# Patient Record
Sex: Female | Born: 1935 | Race: White | Hispanic: No | Smoking: Former smoker
Health system: Southern US, Community
[De-identification: ages and names within clinical notes are randomized; demographics above are authoritative.]

## PROBLEM LIST (undated history)

## (undated) DIAGNOSIS — K222 Esophageal obstruction: Secondary | ICD-10-CM

## (undated) DIAGNOSIS — S42309A Unspecified fracture of shaft of humerus, unspecified arm, initial encounter for closed fracture: Secondary | ICD-10-CM

## (undated) DIAGNOSIS — E785 Hyperlipidemia, unspecified: Secondary | ICD-10-CM

## (undated) DIAGNOSIS — E669 Obesity, unspecified: Secondary | ICD-10-CM

## (undated) DIAGNOSIS — K449 Diaphragmatic hernia without obstruction or gangrene: Secondary | ICD-10-CM

## (undated) DIAGNOSIS — E039 Hypothyroidism, unspecified: Secondary | ICD-10-CM

## (undated) DIAGNOSIS — N83299 Other ovarian cyst, unspecified side: Secondary | ICD-10-CM

## (undated) DIAGNOSIS — K219 Gastro-esophageal reflux disease without esophagitis: Secondary | ICD-10-CM

## (undated) DIAGNOSIS — M8580 Other specified disorders of bone density and structure, unspecified site: Secondary | ICD-10-CM

## (undated) DIAGNOSIS — F32A Depression, unspecified: Secondary | ICD-10-CM

## (undated) DIAGNOSIS — F329 Major depressive disorder, single episode, unspecified: Secondary | ICD-10-CM

## (undated) DIAGNOSIS — I10 Essential (primary) hypertension: Secondary | ICD-10-CM

## (undated) DIAGNOSIS — K5790 Diverticulosis of intestine, part unspecified, without perforation or abscess without bleeding: Secondary | ICD-10-CM

## (undated) DIAGNOSIS — K649 Unspecified hemorrhoids: Secondary | ICD-10-CM

## (undated) DIAGNOSIS — I639 Cerebral infarction, unspecified: Secondary | ICD-10-CM

## (undated) HISTORY — DX: Other specified disorders of bone density and structure, unspecified site: M85.80

## (undated) HISTORY — PX: TUBAL LIGATION: SHX77

## (undated) HISTORY — DX: Other ovarian cyst, unspecified side: N83.299

## (undated) HISTORY — DX: Major depressive disorder, single episode, unspecified: F32.9

## (undated) HISTORY — DX: Diaphragmatic hernia without obstruction or gangrene: K44.9

## (undated) HISTORY — DX: Cerebral infarction, unspecified: I63.9

## (undated) HISTORY — DX: Unspecified hemorrhoids: K64.9

## (undated) HISTORY — DX: Essential (primary) hypertension: I10

## (undated) HISTORY — DX: Obesity, unspecified: E66.9

## (undated) HISTORY — DX: Gastro-esophageal reflux disease without esophagitis: K21.9

## (undated) HISTORY — DX: Unspecified fracture of shaft of humerus, unspecified arm, initial encounter for closed fracture: S42.309A

## (undated) HISTORY — DX: Depression, unspecified: F32.A

## (undated) HISTORY — DX: Esophageal obstruction: K22.2

## (undated) HISTORY — DX: Hypothyroidism, unspecified: E03.9

## (undated) HISTORY — DX: Diverticulosis of intestine, part unspecified, without perforation or abscess without bleeding: K57.90

## (undated) HISTORY — DX: Hyperlipidemia, unspecified: E78.5

## (undated) HISTORY — PX: CHOLECYSTECTOMY: SHX55

---

## 1988-04-05 HISTORY — PX: MOHS SURGERY: SUR867

## 1999-06-12 ENCOUNTER — Encounter: Admission: RE | Admit: 1999-06-12 | Discharge: 1999-06-12 | Payer: Self-pay | Admitting: Family Medicine

## 1999-06-12 ENCOUNTER — Encounter: Payer: Self-pay | Admitting: Family Medicine

## 1999-11-25 ENCOUNTER — Other Ambulatory Visit: Admission: RE | Admit: 1999-11-25 | Discharge: 1999-11-25 | Payer: Self-pay | Admitting: Family Medicine

## 2000-02-03 ENCOUNTER — Ambulatory Visit (HOSPITAL_COMMUNITY): Admission: RE | Admit: 2000-02-03 | Discharge: 2000-02-03 | Payer: Self-pay | Admitting: Gastroenterology

## 2000-09-13 ENCOUNTER — Encounter: Payer: Self-pay | Admitting: Family Medicine

## 2000-09-13 ENCOUNTER — Encounter: Admission: RE | Admit: 2000-09-13 | Discharge: 2000-09-13 | Payer: Self-pay | Admitting: Family Medicine

## 2001-07-19 ENCOUNTER — Encounter: Payer: Self-pay | Admitting: Family Medicine

## 2001-07-19 ENCOUNTER — Encounter: Admission: RE | Admit: 2001-07-19 | Discharge: 2001-07-19 | Payer: Self-pay | Admitting: Family Medicine

## 2001-11-27 ENCOUNTER — Encounter: Admission: RE | Admit: 2001-11-27 | Discharge: 2001-11-27 | Payer: Self-pay | Admitting: Family Medicine

## 2001-11-27 ENCOUNTER — Encounter: Payer: Self-pay | Admitting: Family Medicine

## 2001-12-06 ENCOUNTER — Other Ambulatory Visit: Admission: RE | Admit: 2001-12-06 | Discharge: 2001-12-06 | Payer: Self-pay | Admitting: Family Medicine

## 2003-02-08 ENCOUNTER — Encounter: Admission: RE | Admit: 2003-02-08 | Discharge: 2003-02-08 | Payer: Self-pay | Admitting: Family Medicine

## 2003-12-18 ENCOUNTER — Other Ambulatory Visit: Admission: RE | Admit: 2003-12-18 | Discharge: 2003-12-18 | Payer: Self-pay | Admitting: Family Medicine

## 2004-03-05 ENCOUNTER — Ambulatory Visit (HOSPITAL_COMMUNITY): Admission: RE | Admit: 2004-03-05 | Discharge: 2004-03-05 | Payer: Self-pay | Admitting: Family Medicine

## 2004-03-19 ENCOUNTER — Encounter: Admission: RE | Admit: 2004-03-19 | Discharge: 2004-03-19 | Payer: Self-pay | Admitting: Family Medicine

## 2004-04-28 ENCOUNTER — Encounter: Admission: RE | Admit: 2004-04-28 | Discharge: 2004-04-28 | Payer: Self-pay | Admitting: Family Medicine

## 2004-09-02 ENCOUNTER — Encounter: Admission: RE | Admit: 2004-09-02 | Discharge: 2004-09-02 | Payer: Self-pay | Admitting: Family Medicine

## 2005-03-19 ENCOUNTER — Encounter: Admission: RE | Admit: 2005-03-19 | Discharge: 2005-03-19 | Payer: Self-pay | Admitting: *Deleted

## 2005-12-28 ENCOUNTER — Encounter: Admission: RE | Admit: 2005-12-28 | Discharge: 2005-12-28 | Payer: Self-pay | Admitting: Family Medicine

## 2006-03-21 ENCOUNTER — Encounter: Admission: RE | Admit: 2006-03-21 | Discharge: 2006-03-21 | Payer: Self-pay | Admitting: Family Medicine

## 2006-09-13 ENCOUNTER — Encounter: Admission: RE | Admit: 2006-09-13 | Discharge: 2006-09-13 | Payer: Self-pay | Admitting: Endocrinology

## 2007-01-06 ENCOUNTER — Encounter: Admission: RE | Admit: 2007-01-06 | Discharge: 2007-01-06 | Payer: Self-pay | Admitting: Family Medicine

## 2007-03-23 ENCOUNTER — Encounter: Admission: RE | Admit: 2007-03-23 | Discharge: 2007-03-23 | Payer: Self-pay | Admitting: Family Medicine

## 2007-10-11 ENCOUNTER — Encounter: Admission: RE | Admit: 2007-10-11 | Discharge: 2007-10-11 | Payer: Self-pay | Admitting: Family Medicine

## 2008-03-13 ENCOUNTER — Encounter: Admission: RE | Admit: 2008-03-13 | Discharge: 2008-03-13 | Payer: Self-pay | Admitting: Family Medicine

## 2008-04-05 DIAGNOSIS — M8580 Other specified disorders of bone density and structure, unspecified site: Secondary | ICD-10-CM

## 2008-04-05 HISTORY — DX: Other specified disorders of bone density and structure, unspecified site: M85.80

## 2009-03-17 ENCOUNTER — Encounter: Admission: RE | Admit: 2009-03-17 | Discharge: 2009-03-17 | Payer: Self-pay | Admitting: Obstetrics & Gynecology

## 2009-04-05 DIAGNOSIS — N83299 Other ovarian cyst, unspecified side: Secondary | ICD-10-CM

## 2009-04-05 HISTORY — DX: Other ovarian cyst, unspecified side: N83.299

## 2009-08-13 ENCOUNTER — Encounter: Admission: RE | Admit: 2009-08-13 | Discharge: 2009-08-13 | Payer: Self-pay | Admitting: Internal Medicine

## 2010-03-18 ENCOUNTER — Encounter
Admission: RE | Admit: 2010-03-18 | Discharge: 2010-03-18 | Payer: Self-pay | Source: Home / Self Care | Attending: Internal Medicine | Admitting: Internal Medicine

## 2010-04-25 ENCOUNTER — Encounter: Payer: Self-pay | Admitting: Family Medicine

## 2010-08-21 NOTE — Procedures (Signed)
[place]  Patient:    Tiffany Rice, Tiffany Rice                   MRN: [id number] Proc. Date: 02/03/00 Adm. Date:  [id number] Attending:  Daine Gip CC:         Talmadge Coventry, M.D.   Procedure Report  PROCEDURE PERFORMED:  Colonoscopy.  ENDOSCOPIST:  Genene Churn. Sherin Quarry, M.D.  INDICATIONS FOR PROCEDURE:  Ms. Imhoff is a 77 year old white female with occult blood in her stool as well as hematochezia who has also had a change in bowel habits including loose stools some of which are urgent.  She has never had a previous evaluation of her colon and it was felt that a baseline study should be obtained on the basis of these problems.  She is not allergic to any medication.  Her other diagnoses are depression and hypothyroidism.  Her current medicines are amitriptyline and Levoxyl.  Prior to the procedure, we discussed the risks and benefits of the procedure including the alternatives and she signed a consent.  Her physical exam prior to the procedure was normal.  PREMEDICATIONS:  Demerol 75 mg, 7.5 mg Versed.  DESCRIPTION OF PROCEDURE:  The Olympus colonoscope was placed in the rectum and advanced to the cecum which was identified by the ileocecal valve as well as the appendiceal orifice.  On removal of the scope, the entire colon was well visualized.  Bleeding internal hemorrhoids were seen.  There was also diverticulosis in the descending and sigmoid colon with some tortuosity of the lumen but no evidence of bleeding or diverticulitis.  No polyps, tumors or neoplastic lesions were seen.  FINAL IMPRESSION: 1. Rectal bleeding from hemorrhoids. 2. Diverticulosis. 3. No polyps.  RECOMMENDATIONS:  The patient should have yearly stools for occult blood, a repeat flexible sigmoidoscopy in five years and possibly a repeat colonoscopy in 10 years or sooner if blood is seen in the stool or if a polyp is seen on flexible  sigmoidoscopy. DD:  02/03/00 TD:  02/03/00 Job: [id number]

## 2011-03-03 ENCOUNTER — Other Ambulatory Visit: Payer: Self-pay | Admitting: Internal Medicine

## 2011-03-03 DIAGNOSIS — Z1231 Encounter for screening mammogram for malignant neoplasm of breast: Secondary | ICD-10-CM

## 2011-04-02 ENCOUNTER — Ambulatory Visit
Admission: RE | Admit: 2011-04-02 | Discharge: 2011-04-02 | Disposition: A | Discharge status: Home / Self Care | Payer: Medicare Other | Source: Ambulatory Visit | Attending: Internal Medicine | Admitting: Internal Medicine

## 2011-04-02 DIAGNOSIS — Z1231 Encounter for screening mammogram for malignant neoplasm of breast: Secondary | ICD-10-CM

## 2012-03-07 ENCOUNTER — Other Ambulatory Visit: Payer: Self-pay | Admitting: Internal Medicine

## 2012-03-07 DIAGNOSIS — R1310 Dysphagia, unspecified: Secondary | ICD-10-CM

## 2012-03-14 ENCOUNTER — Ambulatory Visit
Admission: RE | Admit: 2012-03-14 | Discharge: 2012-03-14 | Disposition: A | Discharge status: Home / Self Care | Payer: Medicare Other | Source: Ambulatory Visit | Attending: Internal Medicine | Admitting: Internal Medicine

## 2012-03-14 DIAGNOSIS — R1310 Dysphagia, unspecified: Secondary | ICD-10-CM

## 2012-03-17 ENCOUNTER — Encounter: Payer: Self-pay | Admitting: Gastroenterology

## 2012-04-13 ENCOUNTER — Ambulatory Visit: Payer: Medicare Other | Admitting: Gastroenterology

## 2012-04-13 ENCOUNTER — Other Ambulatory Visit: Payer: Self-pay

## 2012-04-17 ENCOUNTER — Encounter: Payer: Self-pay | Admitting: Gastroenterology

## 2012-04-17 ENCOUNTER — Ambulatory Visit (INDEPENDENT_AMBULATORY_CARE_PROVIDER_SITE_OTHER): Payer: Medicare Other | Admitting: Gastroenterology

## 2012-04-17 VITALS — BP 126/80 | HR 116 | Ht 64.5 in | Wt 173.5 lb

## 2012-04-17 DIAGNOSIS — R933 Abnormal findings on diagnostic imaging of other parts of digestive tract: Secondary | ICD-10-CM

## 2012-04-17 DIAGNOSIS — K222 Esophageal obstruction: Secondary | ICD-10-CM

## 2012-04-17 DIAGNOSIS — Z800 Family history of malignant neoplasm of digestive organs: Secondary | ICD-10-CM

## 2012-04-17 DIAGNOSIS — R1319 Other dysphagia: Secondary | ICD-10-CM

## 2012-04-17 NOTE — Progress Notes (Signed)
History of Present Illness: This is a 79 year old female with intermittent episodes of solid food dysphagia for several months. He states she chews her food very carefully and needs several small meals each day. Barium esophagram results as listed below. She underwent virtual colonoscopy in 2011 showed a 5-6 mm sigmoid colon polyp and was otherwise unremarkable. She states her son developed colon cancer at age 31 and her daughter has colon polyps. Denies weight loss, abdominal pain, constipation, diarrhea, change in stool caliber, melena, hematochezia, nausea, vomiting, dysphagia, reflux symptoms, chest pain.  Barium esophagram from 03/2012: 1. Small hiatal hernia.  2. Barium pill lodges just above the hiatal hernia and does not  pass, consistent with a short segment distal esophageal stricture.  3. Prominent cricopharyngeus muscle.  4. Mild tertiary contractions.  Review of Systems: Pertinent positive and negative review of systems were noted in the above HPI section. All other review of systems were otherwise negative.  Current Medications, Allergies, Past Medical History, Past Surgical History, Family History and Social History were reviewed in [employer] record.  Physical Exam: General: Well developed , well nourished, no acute distress Head: Normocephalic and atraumatic Eyes:  sclerae anicteric, EOMI Ears: Normal auditory acuity Mouth: No deformity or lesions Neck: Supple, no masses or thyromegaly Lungs: Clear throughout to auscultation Heart: Regular rate and rhythm; no murmurs, rubs or bruits Abdomen: Soft, non tender and non distended. No masses, hepatosplenomegaly or hernias noted. Normal Bowel sounds Musculoskeletal: Symmetrical with no gross deformities  Skin: No lesions on visible extremities Pulses:  Normal pulses noted Extremities: No clubbing, cyanosis, edema or deformities noted Neurological: Alert oriented x 4, grossly nonfocal Cervical Nodes:  No  significant cervical adenopathy Inguinal Nodes: No significant inguinal adenopathy Psychological:  Alert and cooperative. Normal mood and affect  Assessment and Recommendations:  1. Dysphagia with a suspicious for a distal esophageal stricture on BA esophagram. GERD.  Daily PPI and antireflux measures. The risks, benefits, and alternatives to endoscopy with possible biopsy and possible dilation were discussed with the patient and she declines to proceed. She states she would like to see if she has recurrent dysphagia symptoms before she agrees to proceed. She states she is reluctant to have sedation or undergo a procedure. I advised her to contact us if she would like to proceed with upper endoscopy. Ongoing followup with Dr. Wylene Simmer.  2. Small sigmoid colon polyp on virtual colonoscopy, son with colon cancer a daughter colon polyps. It would be reasonable to do a sigmoidoscopy or colonoscopy to remove the polyp and evaluate for additional polyps. It would also be reasonable to repeat a virtual colonoscopy at 5 years from her last virtual colonoscopy. She states she is hesitant to have sedation or undergo a procedure. Ongoing followup with Dr. Wylene Simmer.

## 2012-04-17 NOTE — Patient Instructions (Addendum)
It has been recommended to you by your physician that you have a(n) Endoscopy with dilation and Colonoscopy completed. Per your request, we did not schedule the procedure(s) today. Please contact our office at [phone] should you decide to have the procedure completed.  cc: Guerry Bruin, MD

## 2012-05-22 ENCOUNTER — Encounter: Payer: Self-pay | Admitting: Obstetrics and Gynecology

## 2012-07-06 ENCOUNTER — Ambulatory Visit (INDEPENDENT_AMBULATORY_CARE_PROVIDER_SITE_OTHER): Payer: Medicare Other | Admitting: Obstetrics & Gynecology

## 2012-07-06 ENCOUNTER — Encounter: Payer: Self-pay | Admitting: Obstetrics & Gynecology

## 2012-07-06 VITALS — BP 128/84 | Ht 64.75 in | Wt 173.0 lb

## 2012-07-06 DIAGNOSIS — N83209 Unspecified ovarian cyst, unspecified side: Secondary | ICD-10-CM

## 2012-07-06 DIAGNOSIS — Z01419 Encounter for gynecological examination (general) (routine) without abnormal findings: Secondary | ICD-10-CM

## 2012-07-06 NOTE — Progress Notes (Signed)
79 y.o. [id number] DivorcedCaucasianF here for annual exam.  Doing well.  No VB.  Reports one urgent care visit around Christmas while in [place].  Followed up with Claudette Head and had endoscopy.  Esophageal stricture found.  No LMP recorded. Patient is postmenopausal.          Sexually active: no  The current method of family planning is post menopausal status.    Exercising: no  none Smoker:  no  Health Maintenance: Pap:  04/10/09 normal MMG:  04/02/11 normal knows it is due Colonoscopy:  2012 virtual BMD:   2012  Scheduled 10/14 with Dr. Wylene Simmer TDaP:  Up to date with Dr. Wylene Simmer Labs: sees Dr. Wylene Simmer yearly.  Appt scheduled in April.   reports that she has quit smoking. Her smoking use included Cigarettes. She smoked 0.00 packs per day. She has never used smokeless tobacco. She reports that she does not drink alcohol or use illicit drugs.  Past Medical History  Diagnosis Date  . Hemorrhoids   . Diverticulosis   . GERD (gastroesophageal reflux disease)   . Hypothyroidism   . Obesity   . Depression   . Esophageal stricture   . Hiatal hernia   . HTN (hypertension)   . Osteopenia 2010  . Hyperlipidemia   . Complex ovarian cyst 2011    Left side.  Followed by ultrasound.    Past Surgical History  Procedure Laterality Date  . Cholecystectomy    . Tubal ligation      Current Outpatient Prescriptions  Medication Sig Dispense Refill  . amitriptyline (ELAVIL) 10 MG tablet Take 10 mg by mouth at bedtime as needed.      Marland Kitchen aspirin 81 MG tablet Take 81 mg by mouth daily.      Marland Kitchen DEXILANT 60 MG capsule Take 60 mg by mouth daily.       . hydrochlorothiazide (MICROZIDE) 12.5 MG capsule Take 12.5 mg by mouth daily.      . irbesartan (AVAPRO) 150 MG tablet Take 150 mg by mouth at bedtime.      Marland Kitchen levothyroxine (SYNTHROID, LEVOTHROID) 88 MCG tablet Take 88 mcg by mouth daily.      Marland Kitchen loratadine (CLARITIN) 10 MG tablet Take 10 mg by mouth daily.      . potassium chloride (K-DUR) 10 MEQ  tablet Take 10 mEq by mouth daily.      . pravastatin (PRAVACHOL) 20 MG tablet Take 20 mg by mouth daily.       No current facility-administered medications for this visit.    Family History  Problem Relation Age of Onset  . Clotting disorder Father   . Depression Father   . Breast cancer Mother   . Colon cancer Son   . Colon polyps Daughter   . Hyperlipidemia Son     x2   . Hypertension Son     x 2  . Hyperlipidemia Daughter     ROS:  Pertinent items are noted in HPI.  Otherwise, a comprehensive ROS was negative.  Exam:   BP 128/84  Ht 5' 4.75" (1.645 m)  Wt 173 lb (78.472 kg)  BMI 29 kg/m2  Height:   Height: 5' 4.75" (164.5 cm)  Ht Readings from Last 3 Encounters:  07/06/12 5' 4.75" (1.645 m)  04/17/12 5' 4.5" (1.638 m)    General appearance: alert, cooperative and appears stated age Head: Normocephalic, without obvious abnormality, atraumatic Neck: no adenopathy, supple, symmetrical, trachea midline and thyroid normal to inspection and palpation Lungs: clear  to auscultation bilaterally Breasts: normal appearance, no masses or tenderness Heart: regular rate and rhythm Abdomen: soft, non-tender; bowel sounds normal; no masses,  no organomegaly Extremities: extremities normal, atraumatic, no cyanosis or edema Skin: Skin color, texture, turgor normal. No rashes or lesions Lymph nodes: Cervical, supraclavicular, and axillary nodes normal. No abnormal inguinal nodes palpated Neurologic: Grossly normal   Pelvic: External genitalia:  no lesions              Urethra:  normal appearing urethra with no masses, tenderness or lesions              Bartholins and Skenes: normal                 Vagina: normal appearing vagina with normal color and discharge, no lesions              Cervix: no lesions              Pap taken: no Bimanual Exam:  Uterus:  normal size, contour, position, consistency, mobility, non-tender              Adnexa: no mass, fullness, tenderness                Rectovaginal: Confirms               Anus:  normal sphincter tone, no lesions  A:  Well Woman with normal exam PMP, no HRT Htn Elevated lipids H/o complex ovarian cyst--3.3 x 4.0 x 3.0 in 3/12  P:   Mammogram--pt knows is due Plan f/u ultrasound here at pt's convenience return annually or prn  An After Visit Summary was printed and given to the patient.

## 2012-07-06 NOTE — Patient Instructions (Addendum)

## 2012-07-19 ENCOUNTER — Encounter: Payer: Self-pay | Admitting: Obstetrics & Gynecology

## 2012-07-19 ENCOUNTER — Ambulatory Visit (INDEPENDENT_AMBULATORY_CARE_PROVIDER_SITE_OTHER): Payer: Medicare Other

## 2012-07-19 ENCOUNTER — Ambulatory Visit (INDEPENDENT_AMBULATORY_CARE_PROVIDER_SITE_OTHER): Payer: Medicare Other | Admitting: Obstetrics & Gynecology

## 2012-07-19 VITALS — BP 118/74 | HR 68 | Resp 16 | Wt 171.8 lb

## 2012-07-19 DIAGNOSIS — N83202 Unspecified ovarian cyst, left side: Secondary | ICD-10-CM

## 2012-07-19 DIAGNOSIS — N83209 Unspecified ovarian cyst, unspecified side: Secondary | ICD-10-CM

## 2012-07-19 NOTE — Progress Notes (Signed)
79 y.o.Divorcedfemale here for a pelvic ultrasound. H/O complex ovarian cyst noted on virtual colonoscopy CT in 2011.  I followed this for a year with no change.  She is here for one final ultrasound.  If stable, we will stop imaging it.   No LMP recorded. Patient is postmenopausal.  Sexually active:  no  Contraception: postmenopausal  FINDINGS: UTERUS: 5.4 x 3.6 x 2.5cm  EMS: 2.67mm with stable simple appearing fluid within endometrial cavity, endometrium symmetric ADNEXA:  Left ovary 2.7  2.9 x 4.1cm with 30 x 26 x 22mm cyst with minimal septation, smooth walls, no vascularity.  Unchanged since 2012.    Right ovary 2.0 x 1.0 x 1.0cm CUL DE SAC: no free fluid present  Assessment:  Left ovarian cyst, essentially unchanged in three years Plan: Will not do any additional ultrasounds unless pt has new symptoms.  She knows to let me know if she has any pain in the future or if she has any vaginal bleeding.  All questions answered.  ~15 minutes spent with patient >50% of time was in face to face discussion of above.

## 2013-02-15 ENCOUNTER — Encounter: Payer: Self-pay | Admitting: Cardiology

## 2013-02-15 ENCOUNTER — Ambulatory Visit (INDEPENDENT_AMBULATORY_CARE_PROVIDER_SITE_OTHER): Payer: Medicare Other | Admitting: Cardiology

## 2013-02-15 VITALS — BP 130/82 | HR 79 | Ht 64.75 in | Wt 176.0 lb

## 2013-02-15 DIAGNOSIS — E039 Hypothyroidism, unspecified: Secondary | ICD-10-CM

## 2013-02-15 DIAGNOSIS — E785 Hyperlipidemia, unspecified: Secondary | ICD-10-CM | POA: Insufficient documentation

## 2013-02-15 DIAGNOSIS — N182 Chronic kidney disease, stage 2 (mild): Secondary | ICD-10-CM

## 2013-02-15 DIAGNOSIS — I10 Essential (primary) hypertension: Secondary | ICD-10-CM

## 2013-02-15 DIAGNOSIS — R079 Chest pain, unspecified: Secondary | ICD-10-CM

## 2013-02-15 NOTE — Progress Notes (Signed)
Patient ID: Tiffany Rice, female   DOB: 1931/04/17, 79 y.o.   MRN: [id number]     Patient Name: Tiffany Rice Date of Encounter: 02/15/2013  Primary Care Provider:  Gaspar Garbe, MD Primary Cardiologist:  Tobias Alexander, H  Problem List   Past Medical History  Diagnosis Date  . Hemorrhoids   . Diverticulosis   . GERD (gastroesophageal reflux disease)   . Hypothyroidism   . Obesity   . Depression   . Esophageal stricture   . Hiatal hernia   . HTN (hypertension)   . Osteopenia 2010  . Hyperlipidemia   . Complex ovarian cyst 2011    Left side.  Followed by ultrasound.   Past Surgical History  Procedure Laterality Date  . Cholecystectomy    . Tubal ligation    . Mohs surgery  1990    Allergies  No Known Allergies  HPI  79 year old female with h/o HTN, HLP, hypothyroidism, obesity, CKD stage III, who has been referred to our clinic for concerns of chest pain. The patient states that she was previosuly very active, but lately hasn't done any activities. She noticed that now when she tries to walk about 10 minutes she develops retrosternal pressure like chest chest pain that resolves at rest in about 5 minutes. She also noticed that walking 1 flight of stairs would cause her chest pressure. She denies syncope, palpitations, PND, orthopnea. She quit smoking a long time ago.   Home Medications  Prior to Admission medications   Medication Sig Start Date End Date Taking? Authorizing Provider  amitriptyline (ELAVIL) 10 MG tablet Take 10 mg by mouth at bedtime as needed.    Historical Provider, MD  aspirin 81 MG tablet Take 81 mg by mouth daily.    Historical Provider, MD  DEXILANT 60 MG capsule Take 60 mg by mouth daily.  03/21/12   Historical Provider, MD  hydrochlorothiazide (MICROZIDE) 12.5 MG capsule Take 12.5 mg by mouth daily.    Historical Provider, MD  irbesartan (AVAPRO) 150 MG tablet Take 150 mg by mouth at bedtime.    Historical Provider, MD    levothyroxine (SYNTHROID, LEVOTHROID) 88 MCG tablet Take 88 mcg by mouth daily.    Historical Provider, MD  loratadine (CLARITIN) 10 MG tablet Take 10 mg by mouth daily.    Historical Provider, MD  potassium chloride (K-DUR) 10 MEQ tablet Take 10 mEq by mouth daily.    Historical Provider, MD  potassium chloride (K-DUR,KLOR-CON) 10 MEQ tablet Take 1 tablet by mouth daily. 07/17/12   Historical Provider, MD  pravastatin (PRAVACHOL) 20 MG tablet Take 20 mg by mouth daily.    Historical Provider, MD    Family History  Family History  Problem Relation Age of Onset  . Clotting disorder Father   . Depression Father   . Breast cancer Mother   . Colon cancer Son   . Colon polyps Daughter   . Hyperlipidemia Son     x2   . Hypertension Son     x 2  . Hyperlipidemia Daughter     Social History  History   Social History  . Marital Status: Divorced    Spouse Name: N/A    Number of Children: 3  . Years of Education: N/A   Occupational History  . Retired    Social History Main Topics  . Smoking status: Former Smoker    Types: Cigarettes  . Smokeless tobacco: Never Used  . Alcohol Use: No  .  Drug Use: No  . Sexual Activity: No   Other Topics Concern  . Not on file   Social History Narrative  . No narrative on file     Review of Systems, as per HPI, otherwise negative General:  No chills, fever, night sweats or weight changes.  Cardiovascular:  No chest pain, dyspnea on exertion, edema, orthopnea, palpitations, paroxysmal nocturnal dyspnea. Dermatological: No rash, lesions/masses Respiratory: No cough, dyspnea Urologic: No hematuria, dysuria Abdominal:   No nausea, vomiting, diarrhea, bright red blood per rectum, melena, or hematemesis Neurologic:  No visual changes, wkns, changes in mental status. All other systems reviewed and are otherwise negative except as noted above.  Physical Exam  BP 130/72, HR 79 BPM General: Pleasant, NAD Psych: Normal affect. Neuro: Alert  and oriented X 3. Moves all extremities spontaneously. HEENT: Normal  Neck: Supple without bruits or JVD. Lungs:  Resp regular and unlabored, CTA. Heart: RRR no s3, s4, or murmurs. Abdomen: Soft, non-tender, non-distended, BS + x 4.  Extremities: No clubbing, cyanosis or edema. DP/PT/Radials 2+ and equal bilaterally.  Labs:  No results found for this basename: CKTOTAL, CKMB, TROPONINI,  in the last 72 hours No results found for this basename: WBC, HGB, HCT, MCV, PLT   No results found for this basename: NA, K, CL, CO2, BUN, CREATININE, CALCIUM, LABALBU, PROT, BILITOT, ALKPHOS, ALT, AST, GLUCOSE,  in the last 168 hours No results found for this basename: CHOL, HDL, LDLCALC, TRIG   No results found for this basename: DDIMER   No components found with this basename: POCBNP,   Accessory Clinical Findings  ECG - NSR, 79 BPM   Assessment & Plan  79 year old female with multiple risk factors for CAD including h/o HTN, HLP, hypothyroidism, obesity, CKD stage III  1. Typical chest pain - we will order an exercise nuclear stress test to evaluate exercise capacity and ischemia  2. Hypertension - well controlled on ARB and HCHTZ  3. Hyperlipidemia on Pravastatin 20 mg po daily, LDL 150, the patient is absolutely oposed to the idea of switching to Lipitor or Crestor because of bad press. The advantages were explained. She has bad diet habbits, eats primarily saturated fats, minimum of greens. She is not very excited about changing it but will try to exercise more.  Follow up in 1 month.    Lars Masson, MD, [facility] 02/15/2013, 11:31 AM

## 2013-02-15 NOTE — Patient Instructions (Signed)
Your physician has requested that you have en exercise stress myoview. For further information please visit [email or web address] Please follow instruction sheet, as given.  Your physician recommends that you schedule a follow-up appointment in: WITH DR. Delton See AFTER STRESS TEST  Your physician recommends that you continue on your current medications as directed. Please refer to the Current Medication list given to you today.

## 2013-03-07 ENCOUNTER — Ambulatory Visit (HOSPITAL_COMMUNITY): Payer: Medicare Other | Attending: Cardiology | Admitting: Radiology

## 2013-03-07 ENCOUNTER — Encounter: Payer: Self-pay | Admitting: Cardiology

## 2013-03-07 VITALS — BP 148/102 | HR 73 | Ht 64.5 in | Wt 173.0 lb

## 2013-03-07 DIAGNOSIS — Z87891 Personal history of nicotine dependence: Secondary | ICD-10-CM | POA: Insufficient documentation

## 2013-03-07 DIAGNOSIS — Z8249 Family history of ischemic heart disease and other diseases of the circulatory system: Secondary | ICD-10-CM | POA: Insufficient documentation

## 2013-03-07 DIAGNOSIS — E785 Hyperlipidemia, unspecified: Secondary | ICD-10-CM | POA: Insufficient documentation

## 2013-03-07 DIAGNOSIS — R0789 Other chest pain: Secondary | ICD-10-CM | POA: Insufficient documentation

## 2013-03-07 DIAGNOSIS — I10 Essential (primary) hypertension: Secondary | ICD-10-CM | POA: Insufficient documentation

## 2013-03-07 DIAGNOSIS — R079 Chest pain, unspecified: Secondary | ICD-10-CM

## 2013-03-07 MED ORDER — TECHNETIUM TC 99M SESTAMIBI GENERIC - CARDIOLITE
11.0000 | Freq: Once | INTRAVENOUS | Status: AC | PRN
  Administered 2013-03-07: 11 via INTRAVENOUS

## 2013-03-07 MED ORDER — TECHNETIUM TC 99M SESTAMIBI GENERIC - CARDIOLITE
33.0000 | Freq: Once | INTRAVENOUS | Status: AC | PRN
  Administered 2013-03-07: 33 via INTRAVENOUS

## 2013-03-07 NOTE — Progress Notes (Signed)
[facility] SITE 3 NUCLEAR MED [address] [place], [place] [address] [phone]    Cardiology Nuclear Med Study  Tiffany Rice is a 79 y.o. female     MRN : [id number]     DOB: 08-03-1931  Procedure Date: 03/07/2013  Nuclear Med Background Indication for Stress Test:  Evaluation for Ischemia History:  No known CAD, MPI 1991 (normal per pt.) Cardiac Risk Factors: Family History - CAD, History of Smoking, Hypertension and Lipids  Symptoms:  Chest Pain with Exertion (last date of chest discomfort was three weeks ago)   Nuclear Pre-Procedure Caffeine/Decaff Intake:  None > 12 hrs NPO After: 12:00am   Lungs:  clear O2 Sat: 98% on room air. IV 0.9% NS with Angio Cath:  22g  IV Site: R Antecubital x 1, tolerated well IV Started by:  Irean Hong, RN  Chest Size (in):  38 Cup Size: C  Height: 5' 4.5" (1.638 m)  Weight:  173 lb (78.472 kg)  BMI:  Body mass index is 29.25 kg/(m^2). Tech Comments:  N/A    Nuclear Med Study 1 or 2 day study: 1 day  Stress Test Type:  Stress  Reading MD: Cassell Clement, MD  Order Authorizing Provider:  Tobias Alexander, MD  Resting Radionuclide: Technetium 64m Sestamibi  Resting Radionuclide Dose: 11.0 mCi   Stress Radionuclide:  Technetium 75m Sestamibi  Stress Radionuclide Dose: 33.0 mCi           Stress Protocol Rest HR: 73 Stress HR: 144  Rest BP: 148/102 Stress BP: 198/93  Exercise Time (min): 3:33 METS: 4.6   Predicted Max HR: 141 bpm % Max HR: 102.13 bpm Rate Pressure Product: 41324   Dose of Adenosine (mg):  n/a Dose of Lexiscan: n/a mg  Dose of Atropine (mg): n/a Dose of Dobutamine: n/a mcg/kg/min (at max HR)  Stress Test Technologist: Nelson Chimes, BS-ES  Nuclear Technologist:  Domenic Polite, CNMT     Rest Procedure:  Myocardial perfusion imaging was performed at rest 45 minutes following the intravenous administration of Technetium 54m Sestamibi. Rest ECG: NSR with non-specific ST-T wave changes  Stress  Procedure:  The patient exercised on the treadmill utilizing the Bruce Protocol for 3:33 minutes. The patient stopped due to SOB and chest pressure.  Technetium 40m Sestamibi was injected at peak exercise and myocardial perfusion imaging was performed after a brief delay. Patients symptoms lessened but did not resolve completely.  Stress ECG: Significant ST abnormalities consistent with ischemia.  QPS Raw Data Images:  Normal; no motion artifact; normal heart/lung ratio. Stress Images:  Normal homogeneous uptake in all areas of the myocardium. Rest Images:  Normal homogeneous uptake in all areas of the myocardium. Subtraction (SDS):  No evidence of ischemia. Transient Ischemic Dilatation (Normal <1.22):  0.79 Lung/Heart Ratio (Normal <0.45):  0.31  Quantitative Gated Spect Images QGS EDV:  43 ml QGS ESV:  08 ml  Impression Exercise Capacity:  Fair exercise capacity. BP Response:  Normal blood pressure response. Clinical Symptoms:  Mild chest pain/dyspnea. ECG Impression:  Significant ST abnormalities consistent with ischemia. Comparison with Prior Nuclear Study: No images to compare  Overall Impression:  Low risk stress nuclear study. Normal perfusion. No evidence of ischemia by perfusion imaging. Vigorous LV systolic function. EKG at rest shows mild nonspecific ST depression which increases during exercise. Suspect "false positive" EKG response to exercise.   LV Ejection Fraction: 82%.  LV Wall Motion:  NL LV Function; NL Wall Motion   [employer]

## 2013-03-12 ENCOUNTER — Ambulatory Visit: Payer: Medicare Other | Admitting: Cardiology

## 2013-03-14 ENCOUNTER — Ambulatory Visit (INDEPENDENT_AMBULATORY_CARE_PROVIDER_SITE_OTHER): Payer: Medicare Other | Admitting: Cardiology

## 2013-03-14 ENCOUNTER — Encounter: Payer: Self-pay | Admitting: Cardiology

## 2013-03-14 VITALS — BP 143/78 | HR 80 | Ht 64.0 in | Wt 176.0 lb

## 2013-03-14 DIAGNOSIS — I10 Essential (primary) hypertension: Secondary | ICD-10-CM

## 2013-03-14 DIAGNOSIS — R079 Chest pain, unspecified: Secondary | ICD-10-CM

## 2013-03-14 DIAGNOSIS — E785 Hyperlipidemia, unspecified: Secondary | ICD-10-CM

## 2013-03-14 NOTE — Patient Instructions (Signed)
**Note De-identified Ruford Dudzinski Obfuscation** Your physician recommends that you continue on your current medications as directed. Please refer to the Current Medication list given to you today.  Your physician wants you to follow-up in: 1 year. You will receive a reminder letter in the mail two months in advance. If you don't receive a letter, please call our office to schedule the follow-up appointment.  

## 2013-03-14 NOTE — Progress Notes (Signed)
Patient ID: Tiffany Rice, female   DOB: June 28, 1931, 79 y.o.   MRN: [id number]     Patient Name: Tiffany Rice Date of Encounter: 03/14/2013  Primary Care Provider:  Gaspar Garbe, MD Primary Cardiologist:  Tobias Alexander, H  Problem List   Past Medical History  Diagnosis Date  . Hemorrhoids   . Diverticulosis   . GERD (gastroesophageal reflux disease)   . Hypothyroidism   . Obesity   . Depression   . Esophageal stricture   . Hiatal hernia   . HTN (hypertension)   . Osteopenia 2010  . Hyperlipidemia   . Complex ovarian cyst 2011    Left side.  Followed by ultrasound.   Past Surgical History  Procedure Laterality Date  . Cholecystectomy    . Tubal ligation    . Mohs surgery  1990    Allergies  No Known Allergies  HPI  79 year old female with h/o HTN, HLP, hypothyroidism, obesity, CKD stage III, who has been referred to our clinic for concerns of chest pain. The patient states that she was previosuly very active, but lately hasn't done any activities. She noticed that now when she tries to walk about 10 minutes she develops retrosternal pressure like chest chest pain that resolves at rest in about 5 minutes. She also noticed that walking 1 flight of stairs would cause her chest pressure. She denies syncope, palpitations, PND, orthopnea. She quit smoking a long time ago.   This is a 1 month follow up, her symptoms have improved.  Home Medications  Prior to Admission medications   Medication Sig Start Date End Date Taking? Authorizing Provider  amitriptyline (ELAVIL) 10 MG tablet Take 10 mg by mouth at bedtime as needed.    Historical Provider, MD  aspirin 81 MG tablet Take 81 mg by mouth daily.    Historical Provider, MD  DEXILANT 60 MG capsule Take 60 mg by mouth daily.  03/21/12   Historical Provider, MD  hydrochlorothiazide (MICROZIDE) 12.5 MG capsule Take 12.5 mg by mouth daily.    Historical Provider, MD  irbesartan (AVAPRO) 150 MG tablet Take 150  mg by mouth at bedtime.    Historical Provider, MD  levothyroxine (SYNTHROID, LEVOTHROID) 88 MCG tablet Take 88 mcg by mouth daily.    Historical Provider, MD  loratadine (CLARITIN) 10 MG tablet Take 10 mg by mouth daily.    Historical Provider, MD  potassium chloride (K-DUR) 10 MEQ tablet Take 10 mEq by mouth daily.    Historical Provider, MD  potassium chloride (K-DUR,KLOR-CON) 10 MEQ tablet Take 1 tablet by mouth daily. 07/17/12   Historical Provider, MD  pravastatin (PRAVACHOL) 20 MG tablet Take 20 mg by mouth daily.    Historical Provider, MD    Family History  Family History  Problem Relation Age of Onset  . Clotting disorder Father   . Depression Father   . Breast cancer Mother   . Colon cancer Son   . Colon polyps Daughter   . Hyperlipidemia Son     x2   . Hypertension Son     x 2  . Hyperlipidemia Daughter     Social History  History   Social History  . Marital Status: Divorced    Spouse Name: N/A    Number of Children: 3  . Years of Education: N/A   Occupational History  . Retired    Social History Main Topics  . Smoking status: Former Smoker    Types: Cigarettes  .  Smokeless tobacco: Never Used  . Alcohol Use: No  . Drug Use: No  . Sexual Activity: No   Other Topics Concern  . Not on file   Social History Narrative  . No narrative on file     Review of Systems, as per HPI, otherwise negative General:  No chills, fever, night sweats or weight changes.  Cardiovascular:  No chest pain, dyspnea on exertion, edema, orthopnea, palpitations, paroxysmal nocturnal dyspnea. Dermatological: No rash, lesions/masses Respiratory: No cough, dyspnea Urologic: No hematuria, dysuria Abdominal:   No nausea, vomiting, diarrhea, bright red blood per rectum, melena, or hematemesis Neurologic:  No visual changes, wkns, changes in mental status. All other systems reviewed and are otherwise negative except as noted above.  Physical Exam  BP 130/72, HR 79 BPM General:  Pleasant, NAD Psych: Normal affect. Neuro: Alert and oriented X 3. Moves all extremities spontaneously. HEENT: Normal  Neck: Supple without bruits or JVD. Lungs:  Resp regular and unlabored, CTA. Heart: RRR no s3, s4, or murmurs. Abdomen: Soft, non-tender, non-distended, BS + x 4.  Extremities: No clubbing, cyanosis or edema. DP/PT/Radials 2+ and equal bilaterally.  Labs:  No results found for this basename: CKTOTAL, CKMB, TROPONINI,  in the last 72 hours No results found for this basename: WBC,  HGB,  HCT,  MCV,  PLT   No results found for this basename: NA, K, CL, CO2, BUN, CREATININE, CALCIUM, LABALBU, PROT, BILITOT, ALKPHOS, ALT, AST, GLUCOSE,  in the last 168 hours No results found for this basename: CHOL,  HDL,  LDLCALC,  TRIG   No results found for this basename: DDIMER   No components found with this basename: POCBNP,   Accessory Clinical Findings  ECG - NSR, 79 BPM  Exercise nuclear stress test 03/08/2013 Quantitative Gated Spect Images  QGS EDV: 43 ml  QGS ESV: 08 ml  Impression  Exercise Capacity: Fair exercise capacity.  BP Response: Normal blood pressure response.  Clinical Symptoms: Mild chest pain/dyspnea.  ECG Impression: Significant ST abnormalities consistent with ischemia.  Comparison with Prior Nuclear Study: No images to compare  Overall Impression: Low risk stress nuclear study. Normal perfusion. No evidence of ischemia by perfusion imaging. Vigorous LV systolic function. EKG at rest shows mild nonspecific ST depression which increases during exercise. Suspect "false positive" EKG response to exercise.  LV Ejection Fraction: 82%. LV Wall Motion: NL LV Function; NL Wall Motion    Assessment & Plan  79 year old female with multiple risk factors for CAD including h/o HTN, HLP, hypothyroidism, obesity, CKD stage III  1. Typical chest pain - an exercise nuclear stress test showed fair exercise capacity, but normal function and no ischemia.   2.  Hypertension - well controlled on ARB and HCHTZ  3. Hyperlipidemia on Pravastatin 20 mg po daily, LDL 150, the patient is absolutely oposed to the idea of switching to Lipitor or Crestor because of bad press. The advantages were explained. She has bad diet habbits, eats primarily saturated fats, minimum of greens. She is not very excited about changing it but will try to exercise more.  Follow up in 1 year.    Lars Masson, MD, [facility] 03/14/2013, 4:28 PM

## 2013-03-21 ENCOUNTER — Other Ambulatory Visit: Payer: Self-pay | Admitting: Internal Medicine

## 2013-03-21 DIAGNOSIS — E049 Nontoxic goiter, unspecified: Secondary | ICD-10-CM

## 2013-03-23 ENCOUNTER — Ambulatory Visit
Admission: RE | Admit: 2013-03-23 | Discharge: 2013-03-23 | Disposition: A | Discharge status: Home / Self Care | Payer: Medicare Other | Source: Ambulatory Visit | Attending: Internal Medicine | Admitting: Internal Medicine

## 2013-03-23 DIAGNOSIS — E049 Nontoxic goiter, unspecified: Secondary | ICD-10-CM

## 2013-08-05 ENCOUNTER — Emergency Department (HOSPITAL_COMMUNITY): Payer: Medicare Other

## 2013-08-05 ENCOUNTER — Encounter (HOSPITAL_COMMUNITY): Payer: Self-pay | Admitting: Emergency Medicine

## 2013-08-05 ENCOUNTER — Emergency Department (HOSPITAL_COMMUNITY)
Admission: EM | Admit: 2013-08-05 | Discharge: 2013-08-06 | Disposition: A | Discharge status: Home / Self Care | Payer: Medicare Other | Attending: Emergency Medicine | Admitting: Emergency Medicine

## 2013-08-05 DIAGNOSIS — F329 Major depressive disorder, single episode, unspecified: Secondary | ICD-10-CM | POA: Insufficient documentation

## 2013-08-05 DIAGNOSIS — Z7982 Long term (current) use of aspirin: Secondary | ICD-10-CM | POA: Insufficient documentation

## 2013-08-05 DIAGNOSIS — Y9301 Activity, walking, marching and hiking: Secondary | ICD-10-CM | POA: Insufficient documentation

## 2013-08-05 DIAGNOSIS — S42213A Unspecified displaced fracture of surgical neck of unspecified humerus, initial encounter for closed fracture: Secondary | ICD-10-CM | POA: Insufficient documentation

## 2013-08-05 DIAGNOSIS — I10 Essential (primary) hypertension: Secondary | ICD-10-CM | POA: Insufficient documentation

## 2013-08-05 DIAGNOSIS — E785 Hyperlipidemia, unspecified: Secondary | ICD-10-CM | POA: Insufficient documentation

## 2013-08-05 DIAGNOSIS — Y929 Unspecified place or not applicable: Secondary | ICD-10-CM | POA: Insufficient documentation

## 2013-08-05 DIAGNOSIS — S42309A Unspecified fracture of shaft of humerus, unspecified arm, initial encounter for closed fracture: Secondary | ICD-10-CM

## 2013-08-05 DIAGNOSIS — E669 Obesity, unspecified: Secondary | ICD-10-CM | POA: Insufficient documentation

## 2013-08-05 DIAGNOSIS — W010XXA Fall on same level from slipping, tripping and stumbling without subsequent striking against object, initial encounter: Secondary | ICD-10-CM | POA: Insufficient documentation

## 2013-08-05 DIAGNOSIS — Z8742 Personal history of other diseases of the female genital tract: Secondary | ICD-10-CM | POA: Insufficient documentation

## 2013-08-05 DIAGNOSIS — Z8719 Personal history of other diseases of the digestive system: Secondary | ICD-10-CM | POA: Insufficient documentation

## 2013-08-05 DIAGNOSIS — IMO0002 Reserved for concepts with insufficient information to code with codable children: Secondary | ICD-10-CM | POA: Insufficient documentation

## 2013-08-05 DIAGNOSIS — E039 Hypothyroidism, unspecified: Secondary | ICD-10-CM | POA: Insufficient documentation

## 2013-08-05 DIAGNOSIS — Z8739 Personal history of other diseases of the musculoskeletal system and connective tissue: Secondary | ICD-10-CM | POA: Insufficient documentation

## 2013-08-05 DIAGNOSIS — F3289 Other specified depressive episodes: Secondary | ICD-10-CM | POA: Insufficient documentation

## 2013-08-05 DIAGNOSIS — Z87891 Personal history of nicotine dependence: Secondary | ICD-10-CM | POA: Insufficient documentation

## 2013-08-05 DIAGNOSIS — Z79899 Other long term (current) drug therapy: Secondary | ICD-10-CM | POA: Insufficient documentation

## 2013-08-05 HISTORY — DX: Unspecified fracture of shaft of humerus, unspecified arm, initial encounter for closed fracture: S42.309A

## 2013-08-05 MED ORDER — TRAMADOL HCL 50 MG PO TABS: 50.0000 mg | ORAL_TABLET | Freq: Four times a day (QID) | ORAL | Status: DC | PRN

## 2013-08-05 MED ORDER — TRAMADOL HCL 50 MG PO TABS
50.0000 mg | ORAL_TABLET | Freq: Once | ORAL | Status: AC
  Administered 2013-08-05: 50 mg via ORAL
  Filled 2013-08-05 (×2): qty 1

## 2013-08-05 NOTE — ED Notes (Addendum)
Per EMS, patient walking after taking muscle relaxer, tripped and fell. C/O R arm pain, no deformities, no crepitus noted. Patient taking muscle relaxer for R wrist pain. Patient denies neck/back pain, no LOC.

## 2013-08-05 NOTE — Discharge Instructions (Signed)
Keep sling and splint on.   Take motrin or tylenol for mild pain. Take tramadol for severe pain. You may be drowsy after taking it.   Follow up with orthopedic doctor this week.   Return to ER if you have severe pain, numbness in hand, fingers turning blue.

## 2013-08-05 NOTE — ED Provider Notes (Signed)
CSN: [id number]     Arrival date & time 08/05/13  2210 History   First MD Initiated Contact with Patient 08/05/13 2220     Chief Complaint  Patient presents with  . Fall    R arm injury     (Consider location/radiation/quality/duration/timing/severity/associated sxs/prior Treatment) The history is provided by the patient.  Tiffany Rice is a 80 y.o. female hx of diverticulosis, GERD, HTN, here with fall. She took some meloxican for her right wrist pain today. She then tripped and fell on right arm. She landed on right elbow. Has pain from right elbow radiating up to right shoulder. Denies head injury. Denies neck pain or chest pain or abdominal pain. Denies syncope. Also landed on right knee but denies pain there.    Past Medical History  Diagnosis Date  . Hemorrhoids   . Diverticulosis   . GERD (gastroesophageal reflux disease)   . Hypothyroidism   . Obesity   . Depression   . Esophageal stricture   . Hiatal hernia   . HTN (hypertension)   . Osteopenia 2010  . Hyperlipidemia   . Complex ovarian cyst 2011    Left side.  Followed by ultrasound.   Past Surgical History  Procedure Laterality Date  . Cholecystectomy    . Tubal ligation    . Mohs surgery  1990   Family History  Problem Relation Age of Onset  . Clotting disorder Father   . Depression Father   . Breast cancer Mother   . Colon cancer Son   . Colon polyps Daughter   . Hyperlipidemia Son     x2   . Hypertension Son     x 2  . Hyperlipidemia Daughter    History  Substance Use Topics  . Smoking status: Former Smoker    Types: Cigarettes  . Smokeless tobacco: Never Used  . Alcohol Use: No   OB History   Grav Para Term Preterm Abortions TAB SAB Ect Mult Living   6 3 3  0 3   0 0 3     Review of Systems  Musculoskeletal:       R arm pain   All other systems reviewed and are negative.     Allergies  Review of patient's allergies indicates no known allergies.  Home Medications   Prior to  Admission medications   Medication Sig Start Date End Date Taking? Authorizing Provider  amitriptyline (ELAVIL) 10 MG tablet Take 10 mg by mouth at bedtime as needed.    Historical Provider, MD  aspirin 81 MG tablet Take 81 mg by mouth daily.    Historical Provider, MD  hydrochlorothiazide (MICROZIDE) 12.5 MG capsule Take 12.5 mg by mouth daily.    Historical Provider, MD  irbesartan (AVAPRO) 150 MG tablet Take 150 mg by mouth at bedtime.    Historical Provider, MD  levothyroxine (SYNTHROID, LEVOTHROID) 88 MCG tablet Take 88 mcg by mouth daily.    Historical Provider, MD  loratadine (CLARITIN) 10 MG tablet Take 10 mg by mouth daily.    Historical Provider, MD  potassium chloride (K-DUR) 10 MEQ tablet Take 10 mEq by mouth daily.    Historical Provider, MD  pravastatin (PRAVACHOL) 20 MG tablet Take 20 mg by mouth daily.    Historical Provider, MD   BP 151/77  Pulse 77  Temp(Src) 98.9 F (37.2 C) (Oral)  Resp 16  SpO2 98% Physical Exam  Nursing note and vitals reviewed. Constitutional: She is oriented to person, place, and time.  She appears well-developed.  Uncomfortable   HENT:  Head: Normocephalic and atraumatic.  Mouth/Throat: Oropharynx is clear and moist.  Eyes: Conjunctivae are normal. Pupils are equal, round, and reactive to light.  Neck: Normal range of motion. Neck supple.  Cardiovascular: Normal rate, regular rhythm and normal heart sounds.   Pulmonary/Chest: Effort normal and breath sounds normal. No respiratory distress. She has no wheezes.  Abdominal: Soft. Bowel sounds are normal. She exhibits no distension. There is no tenderness. There is no rebound.  Musculoskeletal:  Abrasion r elbow, dec ROM from pain. Tenderness along proximal humerus, no obvious deformity. Minimal tenderness along R proximal forearm. Nl ROM wrist. 2+ pulses, able to wiggle fingers. No other extremity trauma   Neurological: She is alert and oriented to person, place, and time. No cranial nerve deficit.  Coordination normal.  Skin: Skin is warm and dry.  Psychiatric: She has a normal mood and affect. Her behavior is normal. Judgment and thought content normal.    ED Course  Procedures (including critical care time) Labs Review Labs Reviewed - No data to display  Imaging Review Dg Forearm Right  08/05/2013   CLINICAL DATA:  Arm pain after fall  EXAM: RIGHT FOREARM - 2 VIEW  COMPARISON:  None.  FINDINGS: No acute fracture or malalignment. Generalized osteopenia. STT osteoarthritis in the partly imaged hand. No acute soft tissue changes.  IMPRESSION: No acute osseous findings.   Electronically Signed   By: Tiburcio PeaJonathan  Watts M.D.   On: 08/05/2013 23:02   Dg Humerus Right  08/05/2013   CLINICAL DATA:  Right arm injury after fall  EXAM: RIGHT HUMERUS - 2+ VIEW  COMPARISON:  None.  FINDINGS: Acute fracture through the surgical neck of the right humerus, with severe medial impaction causing varus angulation. As permitted by the submitted projections, the glenohumeral joint and acromioclavicular joint is located. Osteopenia.  IMPRESSION: Acute, impacted surgical neck fracture of the right humerus.   Electronically Signed   By: Tiburcio PeaJonathan  Watts M.D.   On: 08/05/2013 23:00     EKG Interpretation None      MDM   Final diagnoses:  None   Tiffany Rice is a 80 y.o. female here with fall. Not syncope. Will get xrays and reassess.   11:14 PM Xray showed R humeral head fracture. Placed in splint, sling. Will refer to ortho. Will d/c home with tramadol.   Richardean Canalavid H Yao, MD 08/05/13 [phone]

## 2013-08-05 NOTE — ED Notes (Signed)
Bed: [id number] Expected date: 08/05/13 Expected time: 9:58 PM Means of arrival: Ambulance Comments: 80 yo f  Fall

## 2013-10-03 ENCOUNTER — Ambulatory Visit: Payer: Medicare Other | Admitting: Obstetrics & Gynecology

## 2013-10-04 ENCOUNTER — Ambulatory Visit (INDEPENDENT_AMBULATORY_CARE_PROVIDER_SITE_OTHER): Payer: Medicare Other | Admitting: Obstetrics & Gynecology

## 2013-10-04 ENCOUNTER — Encounter: Payer: Self-pay | Admitting: Obstetrics & Gynecology

## 2013-10-04 VITALS — BP 150/90 | HR 108 | Ht 65.5 in | Wt 170.0 lb

## 2013-10-04 DIAGNOSIS — Z01419 Encounter for gynecological examination (general) (routine) without abnormal findings: Secondary | ICD-10-CM

## 2013-10-04 NOTE — Progress Notes (Signed)
80 y.o. [id number] DivorcedCaucasianF here for annual exam.  Recovering from a fractured humerus on right side.  She fell while she was walking.  Doing PT at home.  Has follow up 7/24.    No vaginal bleeding.  H/O complex ovarian cyst that has been followed for three years with sonograms.  We decided last year not to keep looking at it as it has no change in three years.  PCP:  Dr. Esther Hardyisovec  Saw Dr. Russella DarStark last year in January.  He recommended a colonoscopy and an upper GI.  Has an esophageal stricture.  Considering getting this stretched.  She does not want another colonoscopy.    Pt states she doesn't really feel the need for a lot of tests at this point--regarding most of her health care.    No LMP recorded. Patient is postmenopausal.          Sexually active: No.  The current method of family planning is post menopausal status.    Exercising: No.  The patient does not participate in regular exercise at present. Smoker:  no  Health Maintenance: Pap:  04/10/2009, Normal History of abnormal Pap:  no MMG:  04/05/2011, Normal.  Does want to do one this year but will need to wait until after arm heals.   Colonoscopy:  2012 virtual BMD:   2009 TDaP:  Per pt, up to date with Dr. Wylene Simmerisovec Screening Labs: PCP 08/2013, Urine today: Unable to void   reports that she has quit smoking. Her smoking use included Cigarettes. She smoked 0.00 packs per day. She has never used smokeless tobacco. She reports that she does not drink alcohol or use illicit drugs.  Past Medical History  Diagnosis Date  . Hemorrhoids   . Diverticulosis   . GERD (gastroesophageal reflux disease)   . Hypothyroidism   . Obesity   . Depression   . Esophageal stricture   . Hiatal hernia   . HTN (hypertension)   . Osteopenia 2010  . Hyperlipidemia   . Complex ovarian cyst 2011    Left side.  Followed by ultrasound.    Past Surgical History  Procedure Laterality Date  . Cholecystectomy    . Tubal ligation    . Mohs  surgery  1990    Current Outpatient Prescriptions  Medication Sig Dispense Refill  . amitriptyline (ELAVIL) 10 MG tablet Take 10 mg by mouth at bedtime as needed.      Marland Kitchen. aspirin 81 MG tablet Take 81 mg by mouth daily.      . hydrochlorothiazide (MICROZIDE) 12.5 MG capsule Take 12.5 mg by mouth daily.      . irbesartan (AVAPRO) 150 MG tablet Take 150 mg by mouth at bedtime.      Marland Kitchen. levothyroxine (SYNTHROID, LEVOTHROID) 88 MCG tablet Take 88 mcg by mouth daily.      . meloxicam (MOBIC) 7.5 MG tablet Take by mouth daily.      . potassium chloride (K-DUR) 10 MEQ tablet Take 10 mEq by mouth daily.      . traMADol (ULTRAM) 50 MG tablet Take 1 tablet (50 mg total) by mouth every 6 (six) hours as needed.  15 tablet  0  . loratadine (CLARITIN) 10 MG tablet Take 10 mg by mouth daily.      . pravastatin (PRAVACHOL) 20 MG tablet Take 20 mg by mouth daily.       No current facility-administered medications for this visit.    Family History  Problem Relation Age of Onset  .  Clotting disorder Father   . Depression Father   . Breast cancer Mother   . Colon cancer Son   . Colon polyps Daughter   . Hyperlipidemia Son     x2   . Hypertension Son     x 2  . Hyperlipidemia Daughter     ROS:  Pertinent items are noted in HPI.  Otherwise, a comprehensive ROS was negative.  Exam:   BP 150/90  Pulse 108  Ht 5' 5.5" (1.664 m)  Wt 170 lb (77.111 kg)  BMI 27.85 kg/m2  Weight change: -3# Height: 5' 5.5" (166.4 cm)  Ht Readings from Last 3 Encounters:  10/04/13 5' 5.5" (1.664 m)  03/14/13 5\' 4"  (1.626 m)  03/07/13 5' 4.5" (1.638 m)    General appearance: alert, cooperative and appears stated age Head: Normocephalic, without obvious abnormality, atraumatic Neck: no adenopathy, supple, symmetrical, trachea midline and thyroid normal to inspection and palpation Lungs: clear to auscultation bilaterally Breasts: normal appearance, no masses or tenderness Heart: regular rate and rhythm Abdomen: soft,  non-tender; bowel sounds normal; no masses,  no organomegaly Extremities: extremities normal, atraumatic, no cyanosis or edema Skin: Skin color, texture, turgor normal. No rashes or lesions Lymph nodes: Cervical, supraclavicular, and axillary nodes normal. No abnormal inguinal nodes palpated Neurologic: Grossly normal   Pelvic: External genitalia:  no lesions              Urethra:  normal appearing urethra with no masses, tenderness or lesions              Bartholins and Skenes: normal                 Vagina: normal appearing vagina with normal color and discharge, no lesions              Cervix: no lesions              Pap taken: No. Bimanual Exam:  Uterus:  normal size, contour, position, consistency, mobility, non-tender              Adnexa: normal adnexa and no mass, fullness, tenderness               Rectovaginal: Confirms               Anus:  normal sphincter tone, no lesions  A:  Well Woman with normal exam  PMP, no HRT  Htn  Elevated lipids  H/o complex ovarian cyst--3.3 x 4.0 x 3.0 in 3/12   P: Mammogram--pt knows is due.  She wants to do one this year but has to wait until arm heals more. Bloodwork with Dr. Wylene Simmerisovec Declined Dr. Ardell IsaacsStark's recommendations last year for EGD and colonoscopy.    return annually or prn     An After Visit Summary was printed and given to the patient.

## 2013-11-07 ENCOUNTER — Ambulatory Visit: Payer: Medicare Other | Attending: Orthopaedic Surgery

## 2013-11-07 DIAGNOSIS — M25519 Pain in unspecified shoulder: Secondary | ICD-10-CM | POA: Diagnosis not present

## 2013-11-07 DIAGNOSIS — IMO0001 Reserved for inherently not codable concepts without codable children: Secondary | ICD-10-CM | POA: Diagnosis present

## 2013-11-07 DIAGNOSIS — Z4789 Encounter for other orthopedic aftercare: Secondary | ICD-10-CM | POA: Insufficient documentation

## 2013-11-07 DIAGNOSIS — M25619 Stiffness of unspecified shoulder, not elsewhere classified: Secondary | ICD-10-CM | POA: Insufficient documentation

## 2013-11-07 DIAGNOSIS — R293 Abnormal posture: Secondary | ICD-10-CM | POA: Insufficient documentation

## 2013-11-12 ENCOUNTER — Ambulatory Visit: Payer: Medicare Other | Admitting: Physical Therapy

## 2013-11-12 DIAGNOSIS — IMO0001 Reserved for inherently not codable concepts without codable children: Secondary | ICD-10-CM | POA: Diagnosis not present

## 2013-11-14 ENCOUNTER — Ambulatory Visit: Payer: Medicare Other | Admitting: Physical Therapy

## 2013-11-14 DIAGNOSIS — IMO0001 Reserved for inherently not codable concepts without codable children: Secondary | ICD-10-CM | POA: Diagnosis not present

## 2013-11-19 ENCOUNTER — Ambulatory Visit: Payer: Medicare Other | Admitting: Physical Therapy

## 2013-11-19 DIAGNOSIS — IMO0001 Reserved for inherently not codable concepts without codable children: Secondary | ICD-10-CM | POA: Diagnosis not present

## 2013-11-21 ENCOUNTER — Ambulatory Visit: Payer: Medicare Other | Admitting: Rehabilitation

## 2013-11-21 DIAGNOSIS — IMO0001 Reserved for inherently not codable concepts without codable children: Secondary | ICD-10-CM | POA: Diagnosis not present

## 2013-11-28 ENCOUNTER — Ambulatory Visit: Payer: Medicare Other

## 2013-11-28 DIAGNOSIS — IMO0001 Reserved for inherently not codable concepts without codable children: Secondary | ICD-10-CM | POA: Diagnosis not present

## 2013-12-03 ENCOUNTER — Ambulatory Visit: Payer: Medicare Other | Admitting: Physical Therapy

## 2013-12-03 DIAGNOSIS — IMO0001 Reserved for inherently not codable concepts without codable children: Secondary | ICD-10-CM | POA: Diagnosis not present

## 2013-12-06 ENCOUNTER — Ambulatory Visit: Payer: Medicare Other | Attending: Orthopaedic Surgery | Admitting: Physical Therapy

## 2013-12-06 DIAGNOSIS — IMO0001 Reserved for inherently not codable concepts without codable children: Secondary | ICD-10-CM | POA: Insufficient documentation

## 2013-12-11 ENCOUNTER — Ambulatory Visit: Payer: Medicare Other | Admitting: Physical Therapy

## 2013-12-11 DIAGNOSIS — IMO0001 Reserved for inherently not codable concepts without codable children: Secondary | ICD-10-CM | POA: Diagnosis not present

## 2013-12-13 ENCOUNTER — Ambulatory Visit: Payer: Medicare Other | Admitting: Physical Therapy

## 2013-12-13 DIAGNOSIS — IMO0001 Reserved for inherently not codable concepts without codable children: Secondary | ICD-10-CM | POA: Diagnosis not present

## 2013-12-17 ENCOUNTER — Ambulatory Visit: Payer: Medicare Other | Admitting: Physical Therapy

## 2013-12-17 DIAGNOSIS — IMO0001 Reserved for inherently not codable concepts without codable children: Secondary | ICD-10-CM | POA: Diagnosis not present

## 2013-12-19 ENCOUNTER — Ambulatory Visit: Payer: Medicare Other | Admitting: Rehabilitation

## 2013-12-19 DIAGNOSIS — IMO0001 Reserved for inherently not codable concepts without codable children: Secondary | ICD-10-CM | POA: Diagnosis not present

## 2013-12-24 ENCOUNTER — Ambulatory Visit: Payer: Medicare Other | Admitting: Physical Therapy

## 2013-12-24 DIAGNOSIS — IMO0001 Reserved for inherently not codable concepts without codable children: Secondary | ICD-10-CM | POA: Diagnosis not present

## 2013-12-26 ENCOUNTER — Ambulatory Visit: Payer: Medicare Other | Admitting: Physical Therapy

## 2013-12-26 DIAGNOSIS — IMO0001 Reserved for inherently not codable concepts without codable children: Secondary | ICD-10-CM | POA: Diagnosis not present

## 2013-12-31 ENCOUNTER — Ambulatory Visit: Payer: Medicare Other | Admitting: Physical Therapy

## 2013-12-31 DIAGNOSIS — IMO0001 Reserved for inherently not codable concepts without codable children: Secondary | ICD-10-CM | POA: Diagnosis not present

## 2014-01-02 ENCOUNTER — Ambulatory Visit: Payer: Medicare Other | Admitting: Physical Therapy

## 2014-01-02 DIAGNOSIS — IMO0001 Reserved for inherently not codable concepts without codable children: Secondary | ICD-10-CM | POA: Diagnosis not present

## 2014-01-07 ENCOUNTER — Encounter: Payer: Medicare Other | Admitting: Physical Therapy

## 2014-01-09 ENCOUNTER — Ambulatory Visit: Payer: Medicare Other | Attending: Orthopaedic Surgery | Admitting: Physical Therapy

## 2014-01-09 DIAGNOSIS — R293 Abnormal posture: Secondary | ICD-10-CM | POA: Insufficient documentation

## 2014-01-09 DIAGNOSIS — S42301D Unspecified fracture of shaft of humerus, right arm, subsequent encounter for fracture with routine healing: Secondary | ICD-10-CM | POA: Diagnosis not present

## 2014-01-09 DIAGNOSIS — M25511 Pain in right shoulder: Secondary | ICD-10-CM | POA: Insufficient documentation

## 2014-01-09 DIAGNOSIS — M25611 Stiffness of right shoulder, not elsewhere classified: Secondary | ICD-10-CM | POA: Insufficient documentation

## 2014-01-14 ENCOUNTER — Ambulatory Visit: Payer: Medicare Other

## 2014-01-14 DIAGNOSIS — S42301D Unspecified fracture of shaft of humerus, right arm, subsequent encounter for fracture with routine healing: Secondary | ICD-10-CM | POA: Diagnosis not present

## 2014-01-16 ENCOUNTER — Encounter: Payer: Medicare Other | Admitting: Physical Therapy

## 2014-01-21 ENCOUNTER — Encounter: Payer: Medicare Other | Admitting: Physical Therapy

## 2014-01-23 ENCOUNTER — Encounter: Payer: Medicare Other | Admitting: Physical Therapy

## 2014-01-30 IMAGING — RF DG ESOPHAGUS
13 of 16 series · 19 of 24 positions shown · non-contrast
Comparison: None.

CLINICAL DATA: Difficulty swallowing

ESOPHOGRAM/BARIUM SWALLOW
TECHNIQUE: Combined double contrast and single contrast
examination performed using effervescent crystals, thick barium
liquid, and thin barium liquid.
Fluoroscopy time:  2.4 minutes.

[Series 1: run · 1 of 1 slices shown (1 of 13)]
[im 1/1]
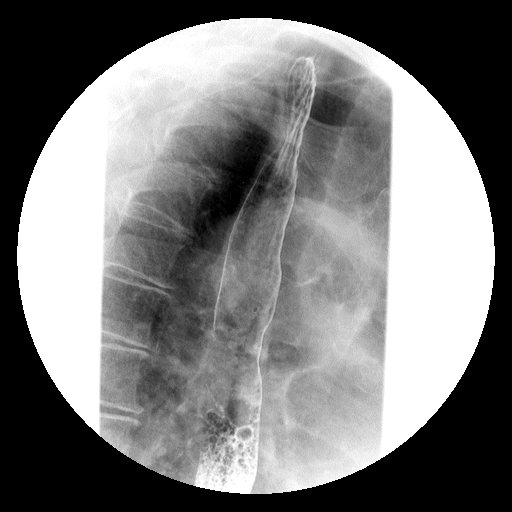

[Series 2: run · 1 of 1 slices shown (2 of 13)]
[im 1/1]
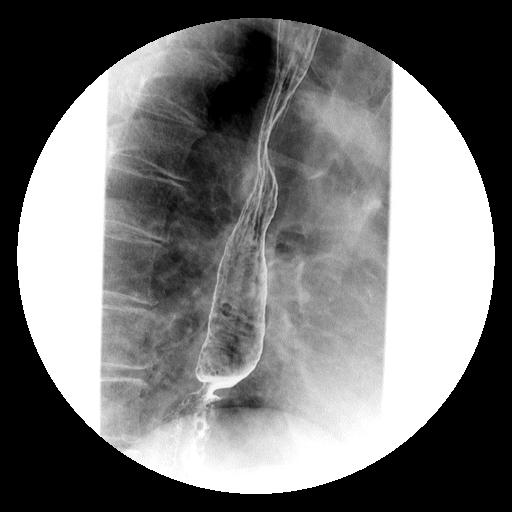

[Series 5: run · 1 of 1 slices shown (3 of 13)]
[im 1/1]
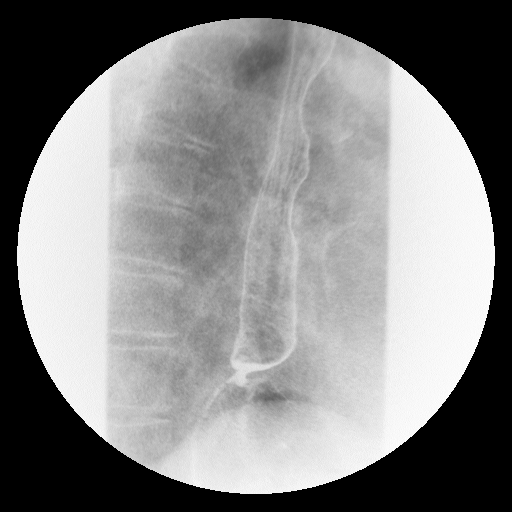

[Series 6: run · 5 of 11 slices shown (4 of 13)]
[im 1/11]
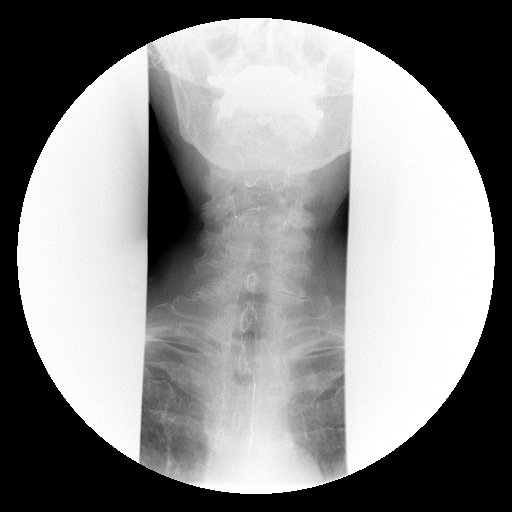
[im 3/11]
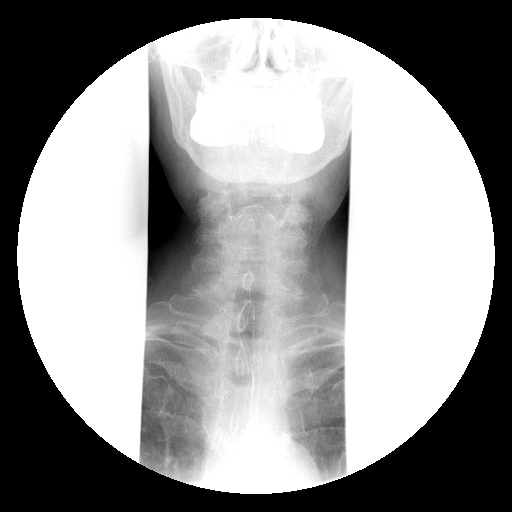
[im 5/11]
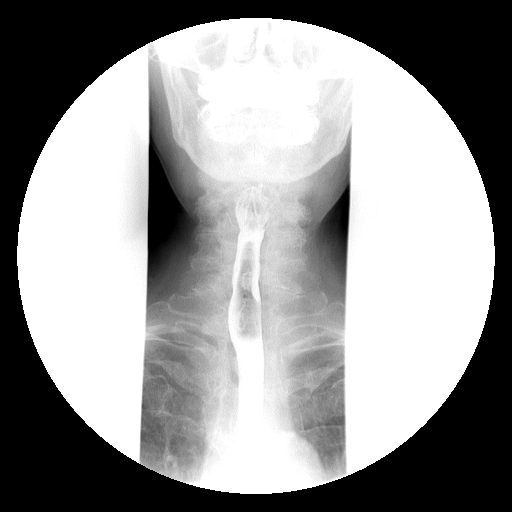
[im 9/11]
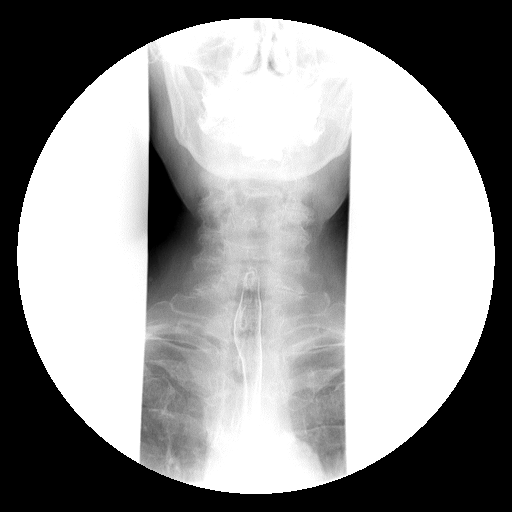
[im 11/11]
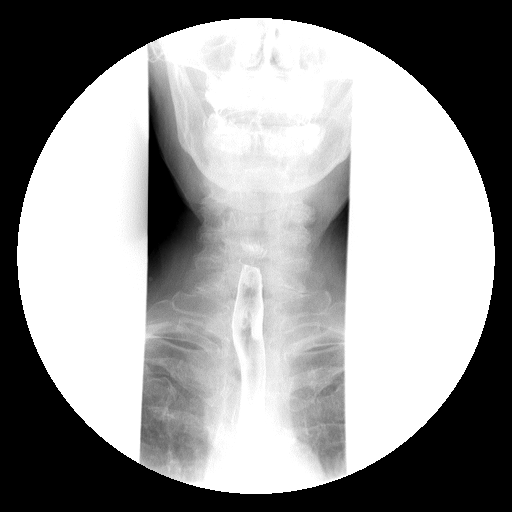

[Series 7: run · 3 of 8 slices shown (5 of 13)]
[im 1/8]
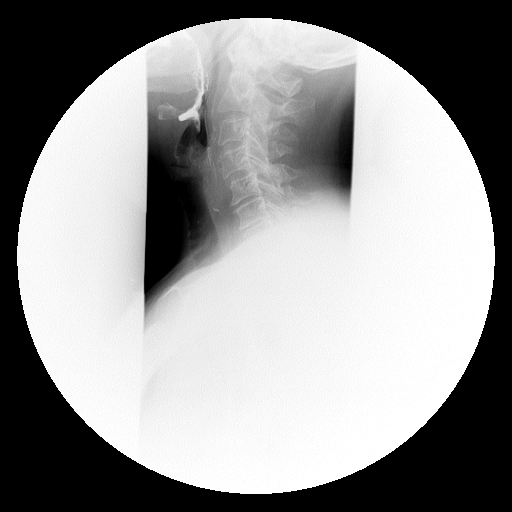
[im 5/8]
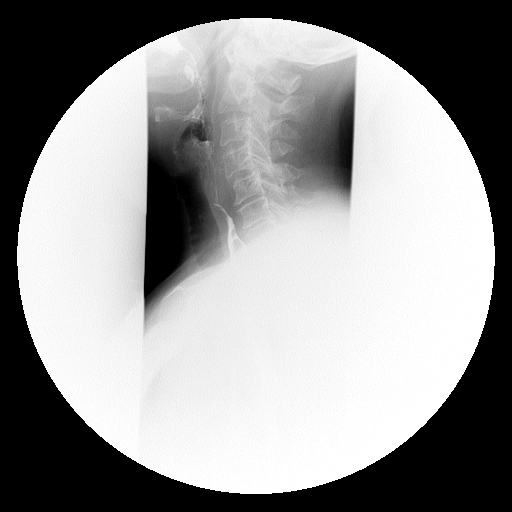
[im 8/8]
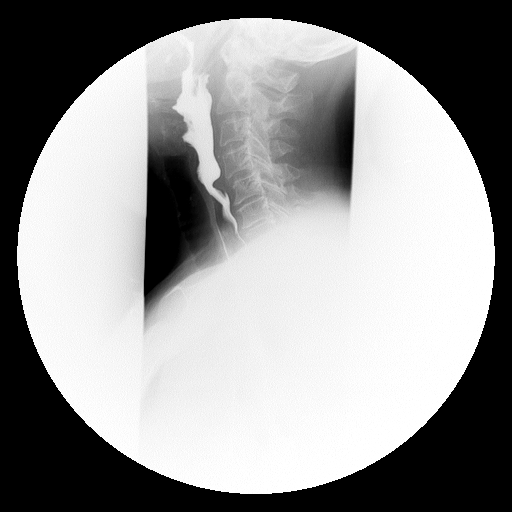

[Series 8: run · 1 of 1 slices shown (6 of 13)]
[im 1/1]
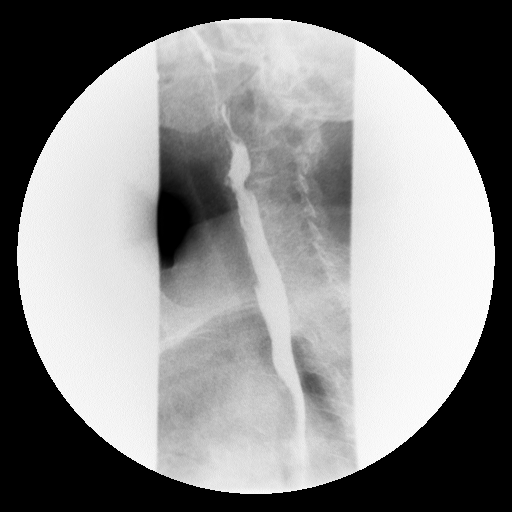

[Series 9: run · 1 of 1 slices shown (7 of 13)]
[im 1/1]
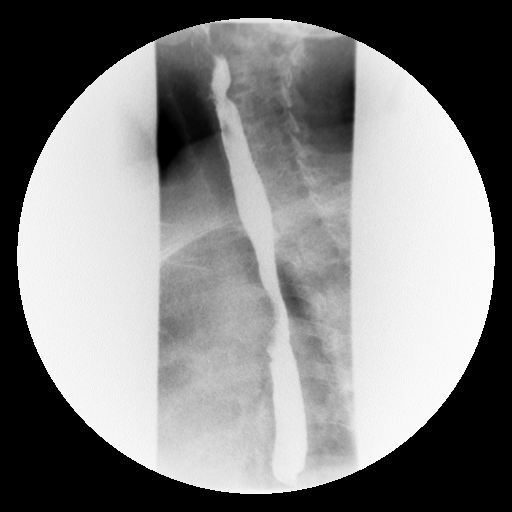

[Series 11: run · 1 of 1 slices shown (8 of 13)]
[im 1/1]
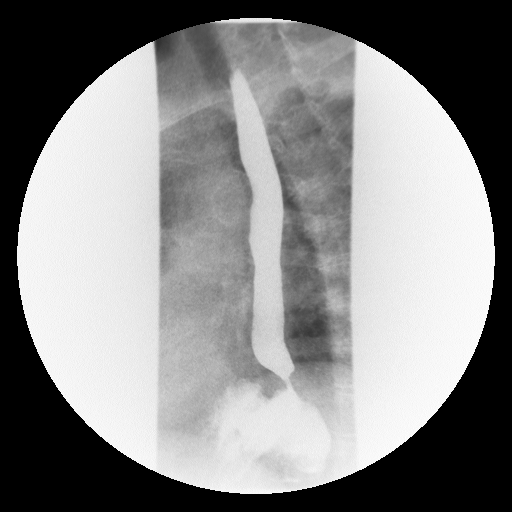

[Series 12: run · 1 of 1 slices shown (9 of 13)]
[im 1/1]
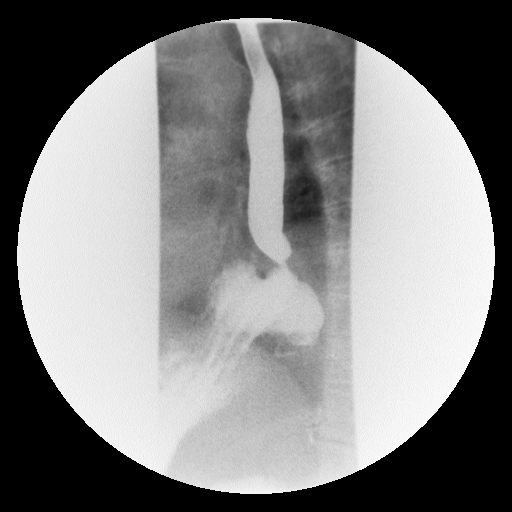

[Series 14: run · 1 of 1 slices shown (10 of 13)]
[im 1/1]
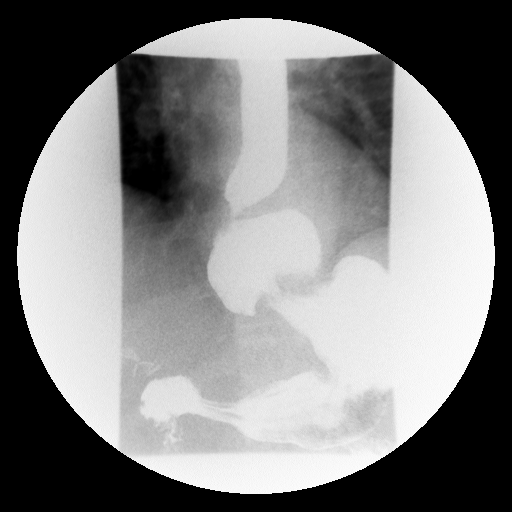

[Series 15: run · 1 of 1 slices shown (11 of 13)]
[im 1/1]
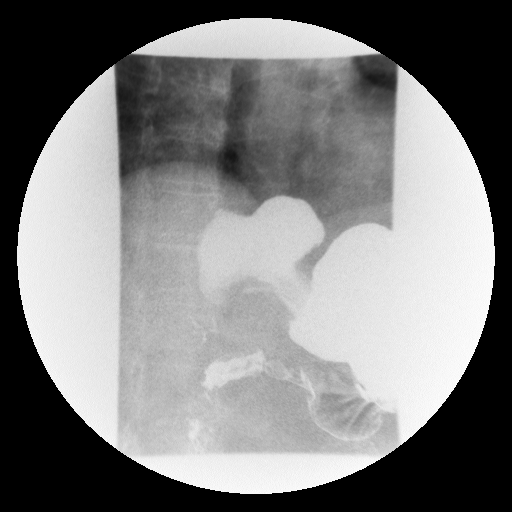

[Series 17: run · 1 of 1 slices shown (12 of 13)]
[im 1/1]
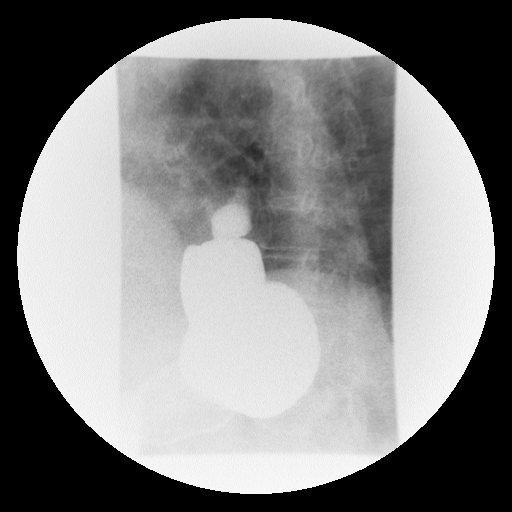

[Series 18: run · 1 of 1 slices shown (13 of 13)]
[im 1/1]
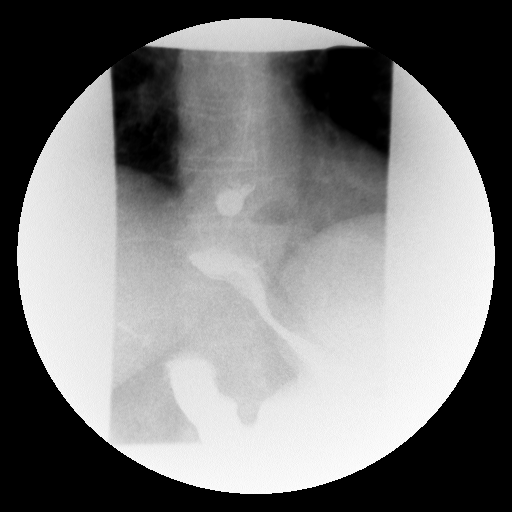

[19 of 24 positions shown; findings below may reference images not displayed]

FINDINGS: Initially a double contrast study was performed.  The
mucosa of the esophagus is unremarkable.  Rapid sequence spot films
of the cervical esophagus show a normal swallowing mechanism.
There is prominence of the cricopharyngeus muscle noted.  There are
tertiary contractions in the mid and distal esophagus.  A moderate
sized hiatal hernia is noted.  Mild gastroesophageal reflux is
demonstrated.  A barium pill was given at the end of the study
which did lodge just above the hiatal hernia within the distal
esophagus, consistent with a short segment stricture.
IMPRESSION: 1.  Small hiatal hernia.
2.  Barium pill lodges just above the hiatal hernia and does not
pass, consistent with a short segment distal esophageal stricture.
3.  Prominent cricopharyngeus muscle.
4.  Mild tertiary contractions.

## 2014-02-04 ENCOUNTER — Encounter: Payer: Self-pay | Admitting: Obstetrics & Gynecology

## 2014-09-30 ENCOUNTER — Other Ambulatory Visit: Payer: Self-pay

## 2014-10-25 ENCOUNTER — Ambulatory Visit (INDEPENDENT_AMBULATORY_CARE_PROVIDER_SITE_OTHER): Payer: Medicare Other | Admitting: Obstetrics & Gynecology

## 2014-10-25 ENCOUNTER — Encounter: Payer: Self-pay | Admitting: Obstetrics & Gynecology

## 2014-10-25 VITALS — BP 142/82 | HR 82 | Resp 14 | Ht 64.0 in | Wt 167.4 lb

## 2014-10-25 DIAGNOSIS — Z01419 Encounter for gynecological examination (general) (routine) without abnormal findings: Secondary | ICD-10-CM | POA: Diagnosis not present

## 2014-10-25 NOTE — Progress Notes (Signed)
81 y.o. [id number] DivorcedCaucasianF here for annual exam.  Pt reports she can't lift arm above about 90 degrees.  This has limited her driving, in particular.  Pt really not interested having surgery.    No vaginal bleeding.  PCP:  Dr. Wylene Simmer.  Labs done with him.  Last visit was in May.    No LMP recorded. Patient is postmenopausal. Sexually active: No.  The current method of family planning is none. none.    Exercising: No.  n/a Smoker:  no  Health Maintenance: Pap:  04/10/2009 normal  History of abnormal Pap:  no MMG:  04/05/2011 Bi-rads 1 neg. Colonoscopy:  2012 virtual  BMD:   2009 low bone mass  TDaP:  Per pt.  Up to date given by Dr. Wylene Simmer Screening Labs: PCP, Hb today: PCP, Urine today: unable to void.   reports that she has quit smoking. Her smoking use included Cigarettes. She has never used smokeless tobacco. She reports that she does not drink alcohol or use illicit drugs.  Past Medical History  Diagnosis Date  . Hemorrhoids   . Diverticulosis   . GERD (gastroesophageal reflux disease)   . Hypothyroidism   . Obesity   . Depression   . Esophageal stricture   . Hiatal hernia   . HTN (hypertension)   . Osteopenia 2010  . Hyperlipidemia   . Complex ovarian cyst 2011    Left side.  Followed by ultrasound.  . Closed fracture of humerus 08/05/13    pt fell while walking down the street    Past Surgical History  Procedure Laterality Date  . Cholecystectomy    . Tubal ligation    . Mohs surgery  1990    Current Outpatient Prescriptions  Medication Sig Dispense Refill  . amitriptyline (ELAVIL) 10 MG tablet Take 10 mg by mouth at bedtime as needed.    Marland Kitchen aspirin 81 MG tablet Take 81 mg by mouth daily.    . hydrochlorothiazide (MICROZIDE) 12.5 MG capsule Take 12.5 mg by mouth daily.    . irbesartan (AVAPRO) 150 MG tablet Take 150 mg by mouth at bedtime.    Marland Kitchen levothyroxine (SYNTHROID, LEVOTHROID) 88 MCG tablet Take 88 mcg by mouth daily.    Marland Kitchen loratadine  (CLARITIN) 10 MG tablet Take 10 mg by mouth daily.    . meloxicam (MOBIC) 7.5 MG tablet Take by mouth daily.    . potassium chloride (K-DUR) 10 MEQ tablet Take 10 mEq by mouth daily.    . pravastatin (PRAVACHOL) 20 MG tablet Take 20 mg by mouth daily.    . traMADol (ULTRAM) 50 MG tablet Take 1 tablet (50 mg total) by mouth every 6 (six) hours as needed. 15 tablet 0   No current facility-administered medications for this visit.    Family History  Problem Relation Age of Onset  . Clotting disorder Father   . Depression Father   . Breast cancer Mother   . Colon cancer Son   . Colon polyps Daughter   . Hyperlipidemia Son     x2   . Hypertension Son     x 2  . Hyperlipidemia Daughter     ROS:  Pertinent items are noted in HPI.  Otherwise, a comprehensive ROS was negative.  Exam:   There were no vitals taken for this visit.    Ht Readings from Last 3 Encounters:  10/04/13 5' 5.5" (1.664 m)  03/14/13 5\' 4"  (1.626 m)  03/07/13 5' 4.5" (1.638 m)    General  appearance: alert, cooperative and appears stated age Head: Normocephalic, without obvious abnormality, atraumatic Neck: no adenopathy, supple, symmetrical, trachea midline and thyroid normal to inspection and palpation Lungs: clear to auscultation bilaterally Breasts: normal appearance, no masses or tenderness Heart: regular rate and rhythm Abdomen: soft, non-tender; bowel sounds normal; no masses,  no organomegaly Extremities: extremities normal, atraumatic, no cyanosis or edema Skin: Skin color, texture, turgor normal. No rashes or lesions Lymph nodes: Cervical, supraclavicular, and axillary nodes normal. No abnormal inguinal nodes palpated Neurologic: Grossly normal   Pelvic: External genitalia:  no lesions              Urethra:  normal appearing urethra with no masses, tenderness or lesions              Bartholins and Skenes: normal                 Vagina: normal appearing vagina with normal color and discharge, no  lesions              Cervix: no lesions              Pap taken: No. Bimanual Exam:  Uterus:  normal size, contour, position, consistency, mobility, non-tender              Adnexa: normal adnexa and no mass, fullness, tenderness               Rectovaginal: Confirms               Anus:  normal sphincter tone, no lesions  Chaperone was present for exam.  A:  Well Woman with normal exam  PMP, no HRT  Htn  Elevated lipids  Mother with breast cancer age 79 H/o complex ovarian cyst--3.3 x 4.0 x 3.0 in 4/14.  Unchanged over three years.  Pt and I agreed to not repeat image unless has changes in symptoms or physical exam  P: Mammogram.  Pt states she does want to have another one.  She will schedule on her own.   Sees Dr. Wylene Simmer yearly.  Seen last in may.  Labs were done then. Declined colonoscopy last year and again this year. return annually or prn

## 2015-02-08 IMAGING — US US SOFT TISSUE HEAD/NECK
1 series · 14 of 25 positions shown · non-contrast
Comparison: 10/11/2007

CLINICAL DATA: Thyroid goiter.  Follow-up nodule.

EXAM:
THYROID ULTRASOUND
TECHNIQUE: Ultrasound examination of the thyroid gland and adjacent soft
tissues was performed.

[Series 1: us soft tissue head/neck · 0.05mm/px · 14 of 42 slices shown]
[im 1/42]
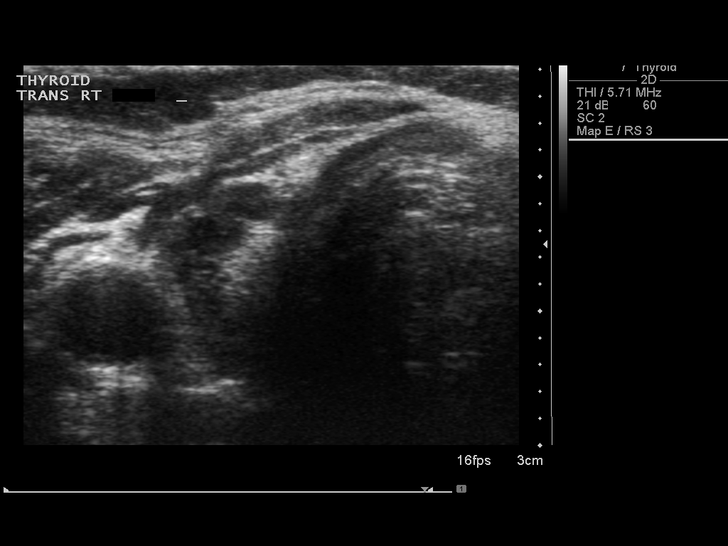
[im 4/42]
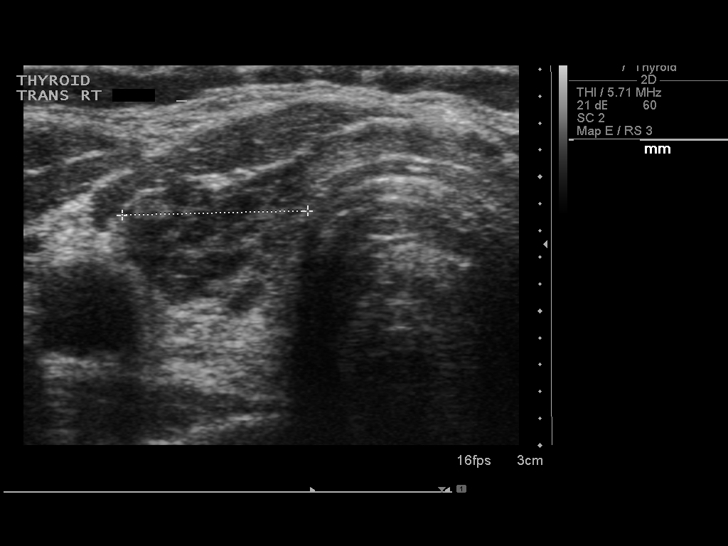
[im 7/42]
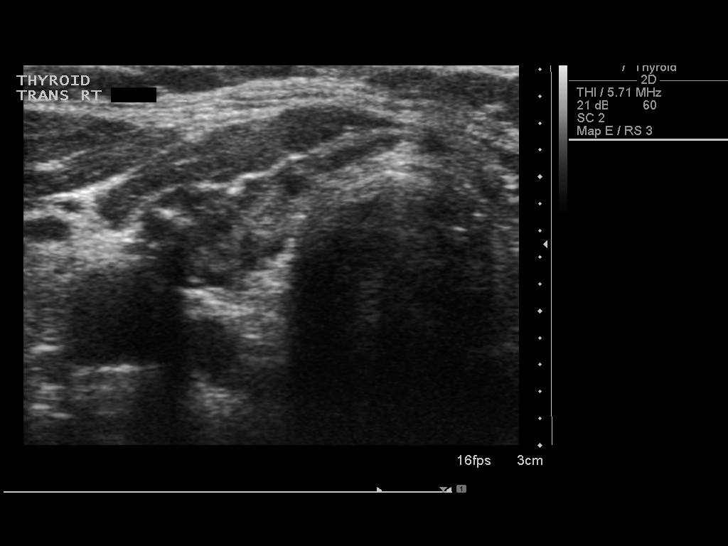
[im 11/42]
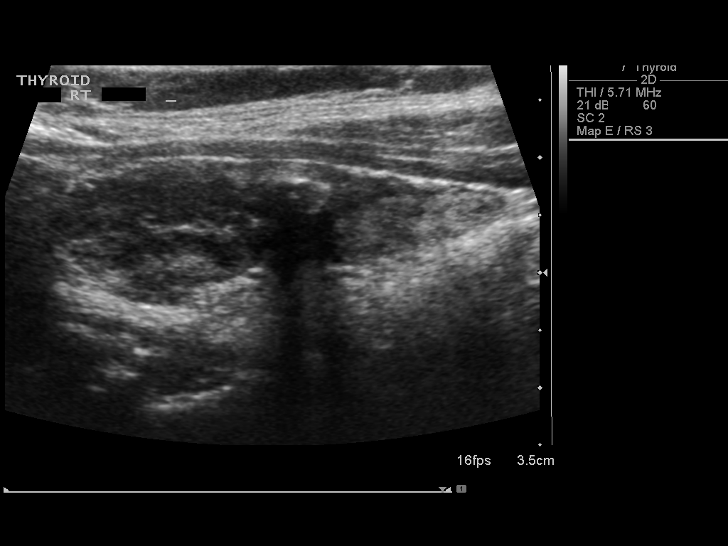
[im 14/42]
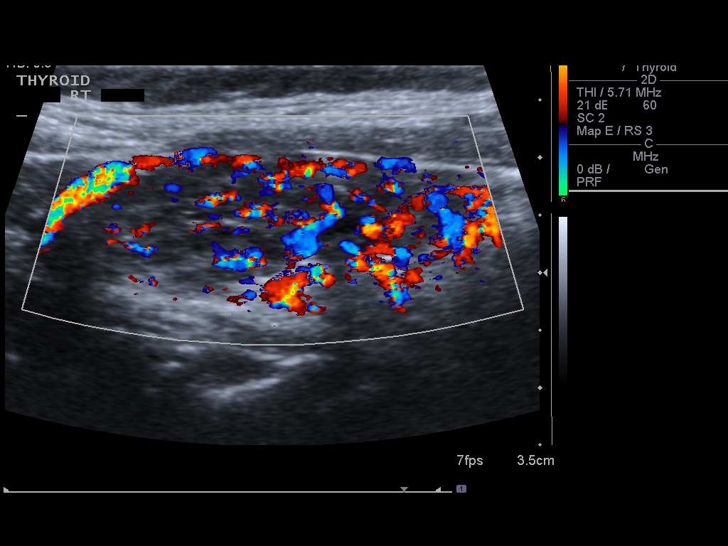
[im 16/42]
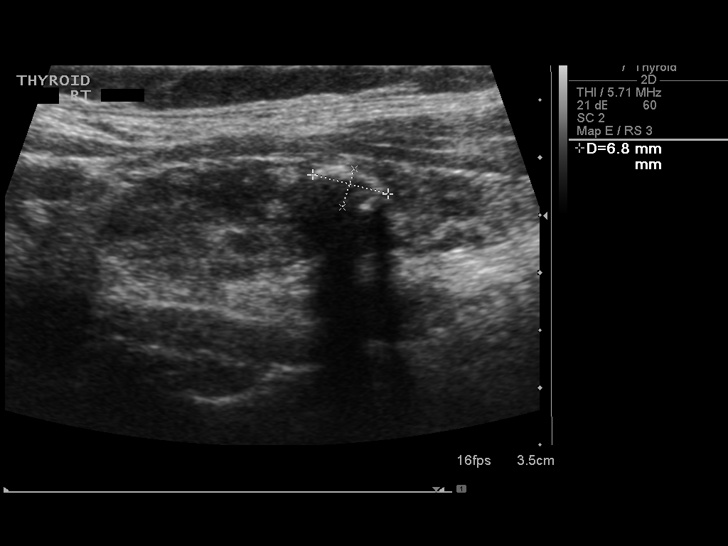
[im 19/42]
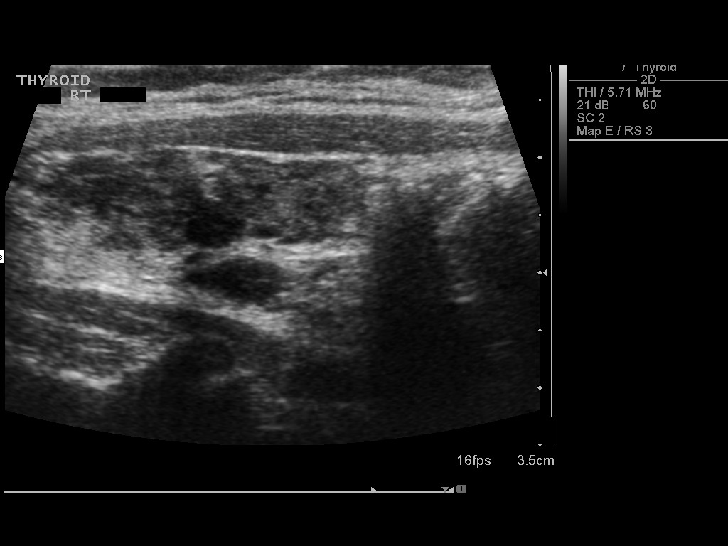
[im 23/42]
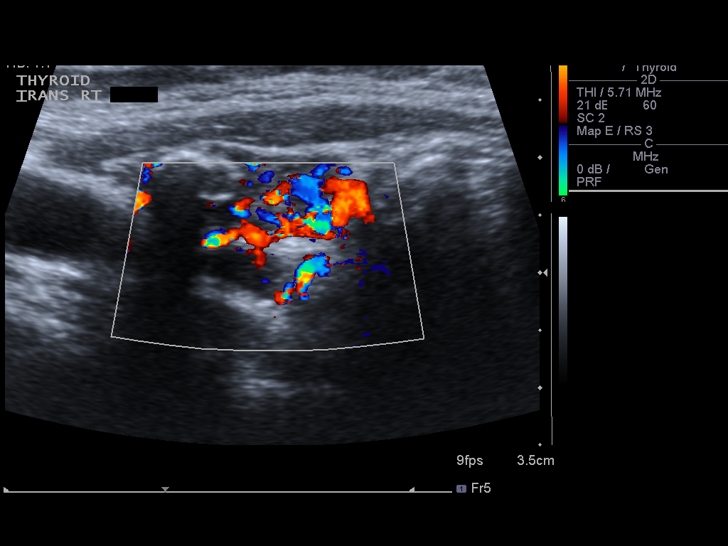
[im 26/42]
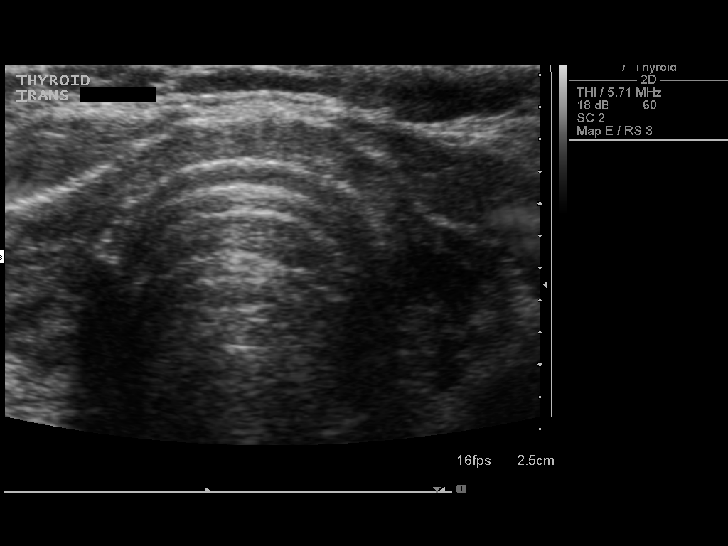
[im 28/42]
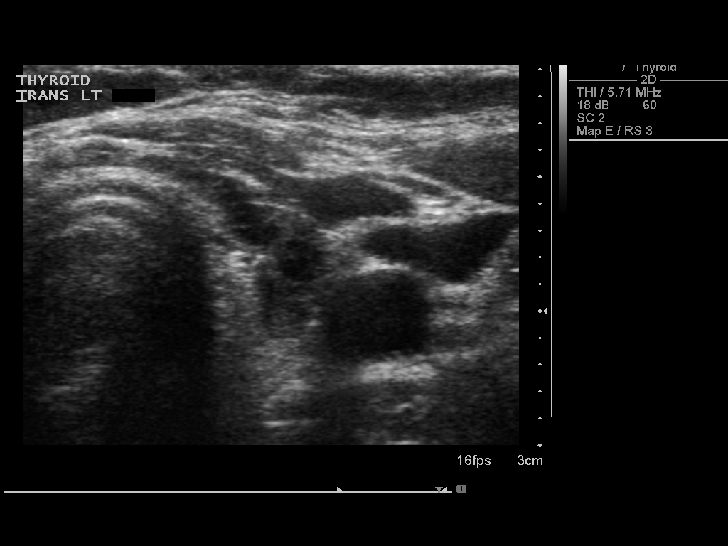
[im 31/42]
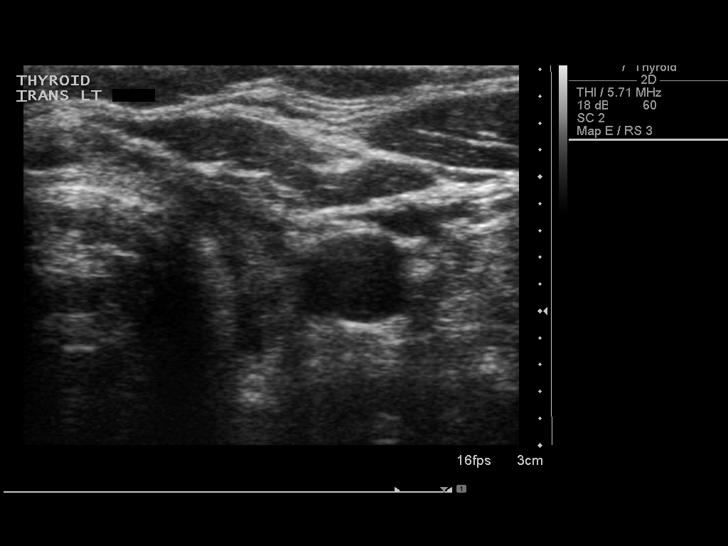
[im 35/42]
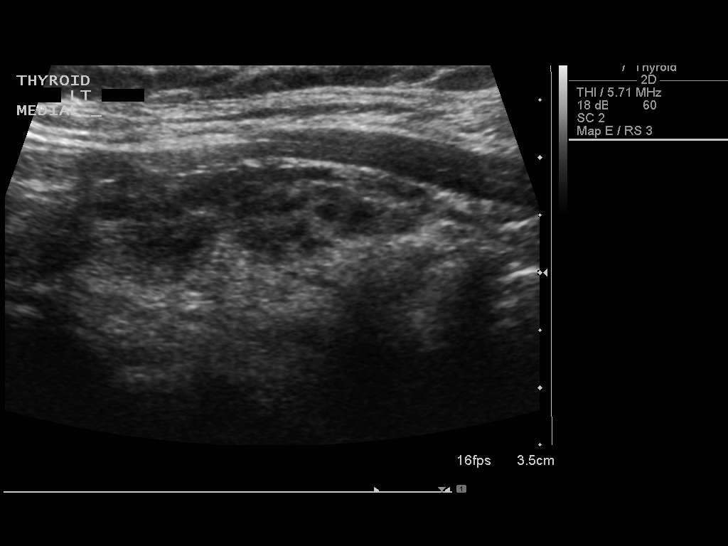
[im 38/42]
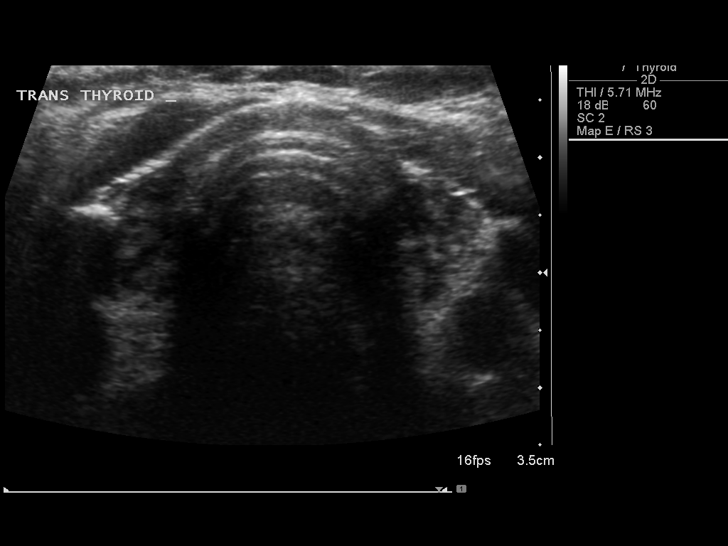
[im 42/42]
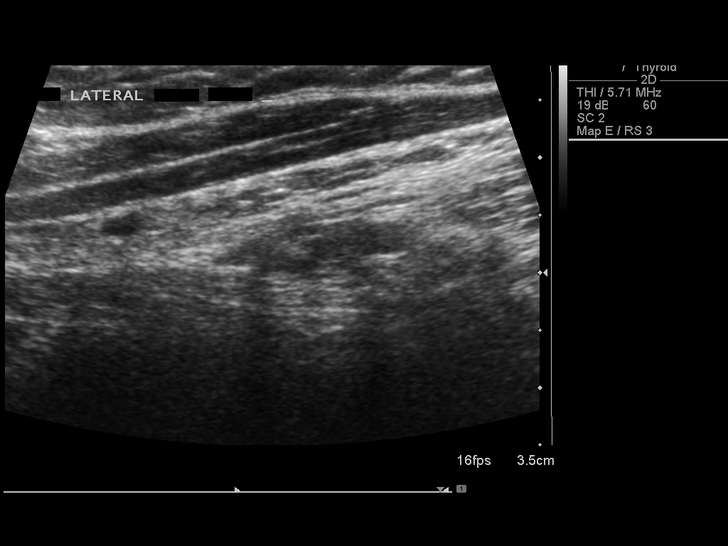

[14 of 25 positions shown; findings below may reference images not displayed]

FINDINGS: Right lobe: 4.1 x 1.3 x 1.4 cm

Left lobe: 3.3 x 1.4 x 1.0 cm

Isthmus: 3 mm

Nodules: Thyroid echotexture is a moderate to markedly
heterogeneous, increased since the prior exam. Dense calcification
in the inter/lower pole right lobe measures maximally 7 mm. No
underlying suspicious lesion identified.

A nodule which is felt to be posterior to the inter/lower pole right
lobe measures 1.0 x 0.5 x 0.8 cm. On the prior, this measured
similar.

Lymphadenopathy:  Absent
IMPRESSION: 1. Similar appearance of an extra thyroid nodule which could
represent a lymph node or a parathyroid lesion.
2. Progression of heterogeneous thyroid echotexture. No well-defined
solid or cystic nodule identified within the thyroid.

## 2015-06-23 IMAGING — CR DG FOREARM 2V*R*
2 series · 2 of 2 positions shown · non-contrast
Comparison: None.

CLINICAL DATA: Arm pain after fall

EXAM:
RIGHT FOREARM - 2 VIEW

[x forearm ap right]
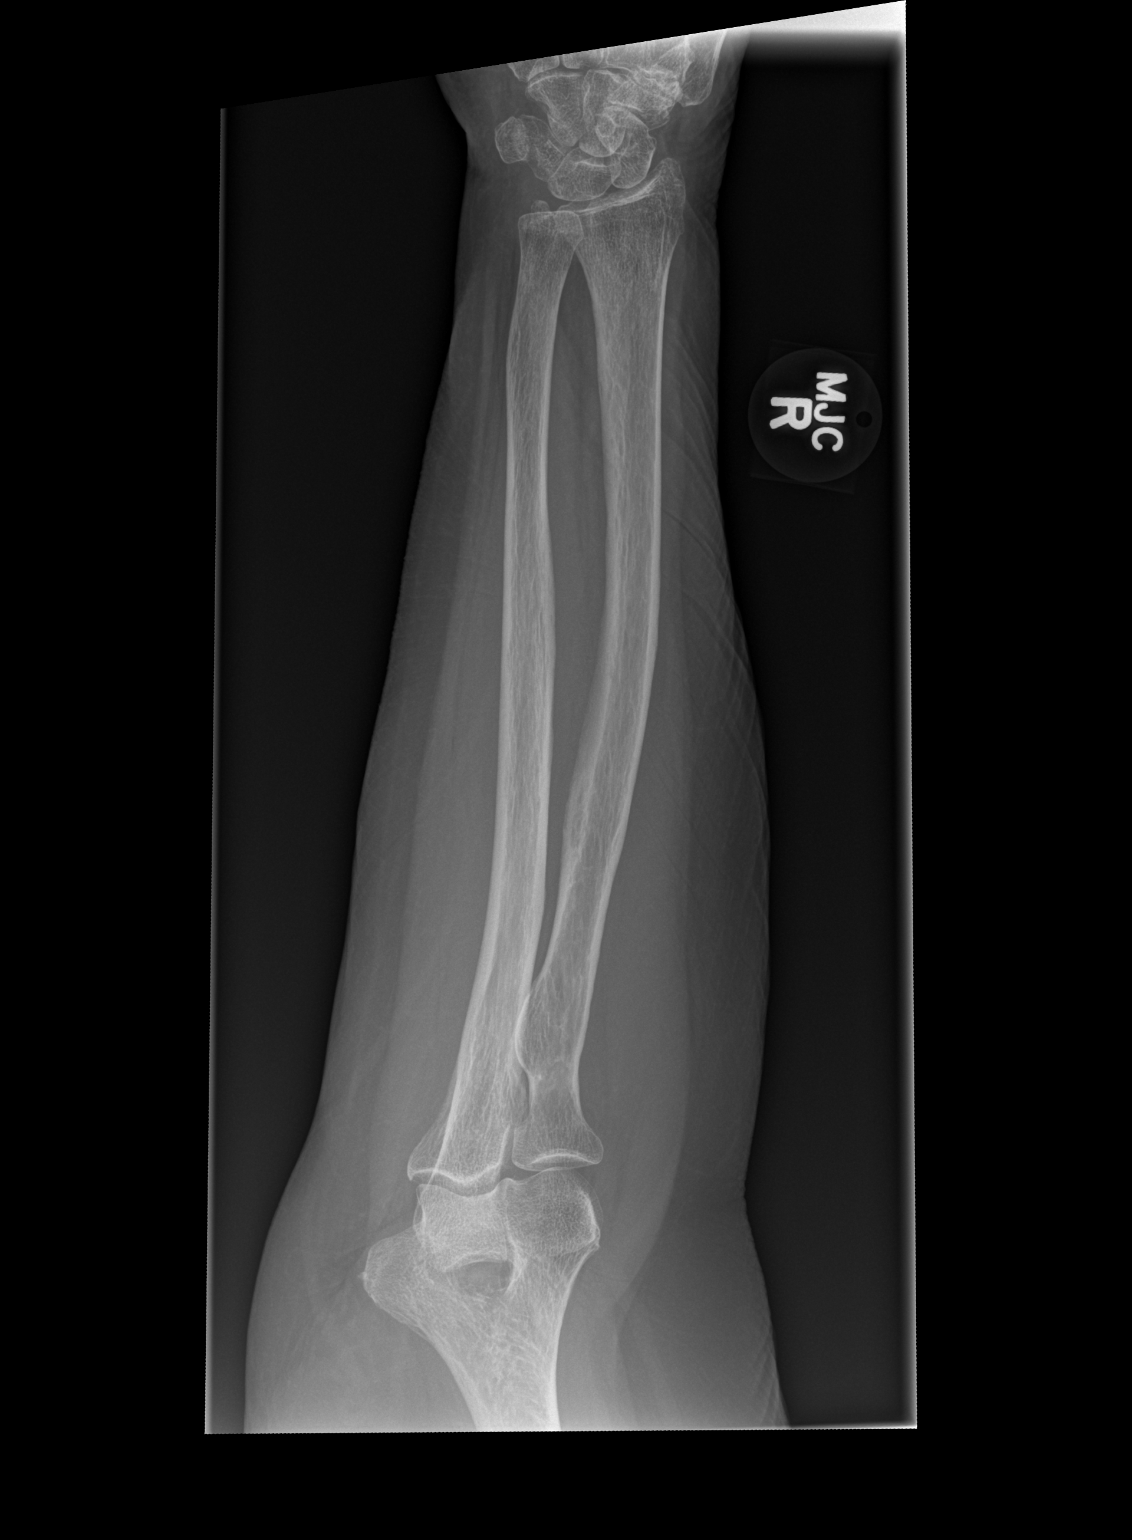

[x forearm lat right]
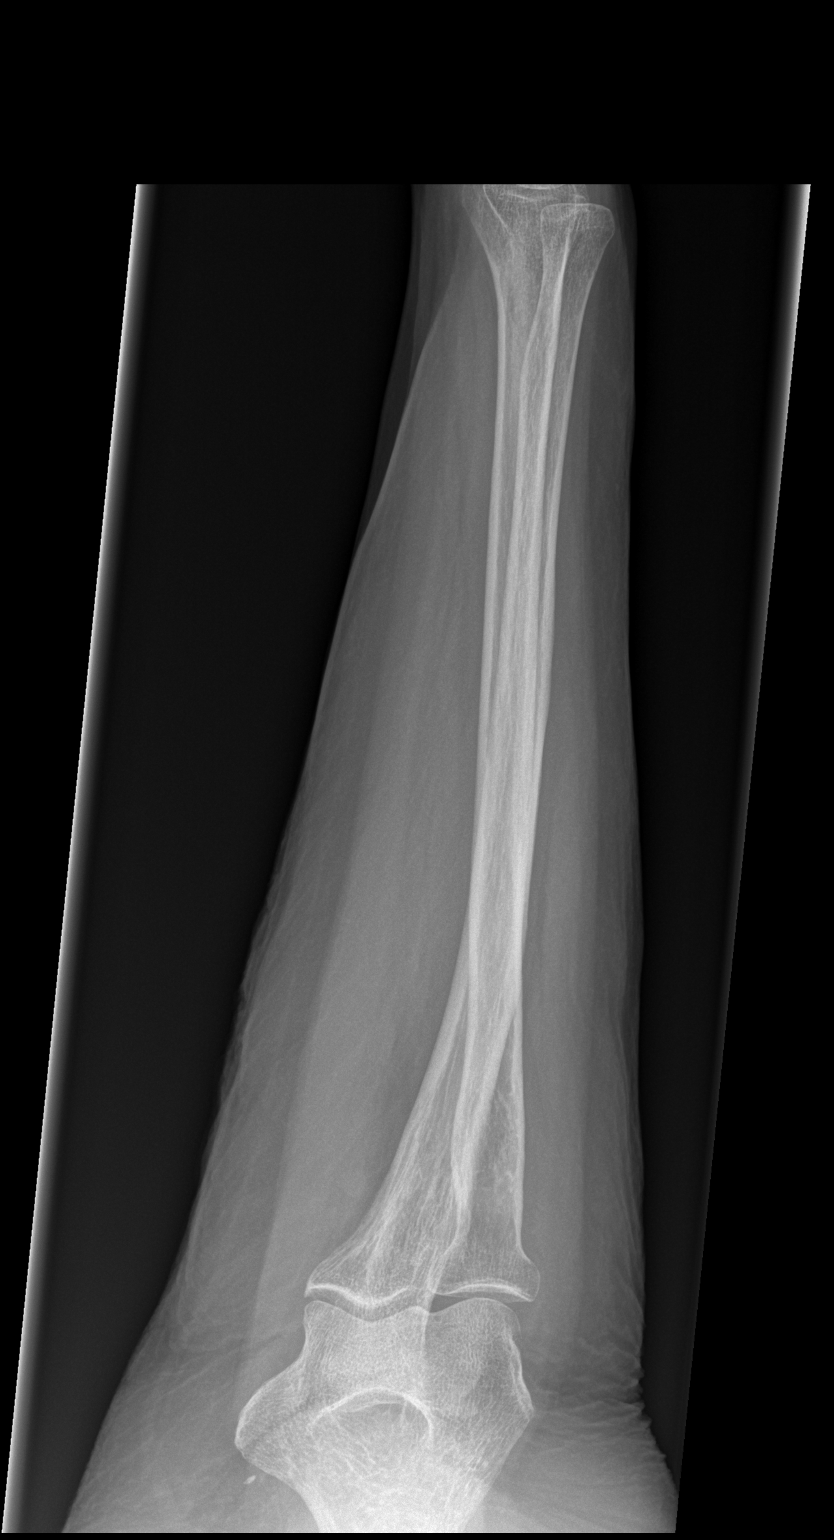

[2 of 2 positions shown; findings below may reference images not displayed]

FINDINGS: No acute fracture or malalignment. Generalized osteopenia. STT
osteoarthritis in the partly imaged hand. No acute soft tissue
changes.
IMPRESSION: No acute osseous findings.

## 2016-02-02 ENCOUNTER — Other Ambulatory Visit: Payer: Self-pay | Admitting: Internal Medicine

## 2016-02-02 ENCOUNTER — Ambulatory Visit
Admission: RE | Admit: 2016-02-02 | Discharge: 2016-02-02 | Disposition: A | Discharge status: Home / Self Care | Payer: Medicare Other | Source: Ambulatory Visit | Attending: Internal Medicine | Admitting: Internal Medicine

## 2016-02-02 DIAGNOSIS — R109 Unspecified abdominal pain: Secondary | ICD-10-CM

## 2016-02-02 DIAGNOSIS — R10A0 Flank pain, unspecified side: Secondary | ICD-10-CM

## 2016-02-02 MED ORDER — IOPAMIDOL (ISOVUE-300) INJECTION 61%
100.0000 mL | Freq: Once | INTRAVENOUS | Status: AC | PRN
  Administered 2016-02-02: 100 mL via INTRAVENOUS

## 2017-02-07 ENCOUNTER — Encounter: Payer: Self-pay | Admitting: Obstetrics & Gynecology

## 2017-02-07 ENCOUNTER — Ambulatory Visit (INDEPENDENT_AMBULATORY_CARE_PROVIDER_SITE_OTHER): Payer: Medicare Other | Admitting: Obstetrics & Gynecology

## 2017-02-07 VITALS — BP 112/78 | HR 80 | Resp 16 | Ht 64.75 in | Wt 168.0 lb

## 2017-02-07 DIAGNOSIS — Z01419 Encounter for gynecological examination (general) (routine) without abnormal findings: Secondary | ICD-10-CM | POA: Diagnosis not present

## 2017-02-07 DIAGNOSIS — N83202 Unspecified ovarian cyst, left side: Secondary | ICD-10-CM | POA: Diagnosis not present

## 2017-02-07 NOTE — Progress Notes (Signed)
83 y.o. Tiffany Rice DivorcedCaucasianF here for annual exam.  Denies vaginal bleeding.  Had back pain last year and had  CT scan.  Ovarian cyst measured 4.6cm.  This was enlarged from prior images.  Repeat PUS recommended for 1 year.  She has not had this done.    Still playing bridge regularly.   PCP:  Dr. Wylene Simmer  No LMP recorded. Patient is postmenopausal.          Sexually active: No.  The current method of family planning is post menopausal status.    Exercising: No.  The patient does not participate in regular exercise at present. Smoker:  no  Health Maintenance: Pap:  04/10/2009 normal  History of abnormal Pap:  no MMG:  04/05/2011 Bi-rads 1 neg Colonoscopy:  2012 virtual BMD:  12/09, normal.  Pt is sure she's had one since then with Dr. Wylene Simmer. TDaP:  Unsure  Pneumonia vaccine(s):  never Zostavax:   never Hep C testing: not indicated Screening Labs: PCP   reports that she has quit smoking. Her smoking use included cigarettes. she has never used smokeless tobacco. She reports that she does not drink alcohol or use drugs.  Past Medical History:  Diagnosis Date  . Closed fracture of humerus 08/05/13   pt fell while walking down the street  . Complex ovarian cyst 2011   Left side.  Followed by ultrasound.  . Depression   . Diverticulosis   . Esophageal stricture   . GERD (gastroesophageal reflux disease)   . Hemorrhoids   . Hiatal hernia   . HTN (hypertension)   . Hyperlipidemia   . Hypothyroidism   . Obesity   . Osteopenia 2010    Past Surgical History:  Procedure Laterality Date  . CHOLECYSTECTOMY    . MOHS SURGERY  1990  . TUBAL LIGATION      Current Outpatient Medications  Medication Sig Dispense Refill  . aspirin 81 MG tablet Take 81 mg by mouth daily.    . hydrochlorothiazide (MICROZIDE) 12.5 MG capsule Take 12.5 mg by mouth daily.    . irbesartan (AVAPRO) 150 MG tablet Take 150 mg by mouth at bedtime.    Marland Kitchen levothyroxine (SYNTHROID, LEVOTHROID) 88 MCG  tablet Take 88 mcg by mouth daily.    Marland Kitchen loratadine (CLARITIN) 10 MG tablet Take 10 mg by mouth daily.    . potassium chloride (K-DUR) 10 MEQ tablet Take 10 mEq by mouth daily.    . pravastatin (PRAVACHOL) 20 MG tablet Take 20 mg by mouth daily.     No current facility-administered medications for this visit.     Family History  Problem Relation Age of Onset  . Clotting disorder Father   . Depression Father   . Breast cancer Mother   . Colon cancer Son   . Colon polyps Daughter   . Hyperlipidemia Son        x2   . Hypertension Son        x 2  . Hyperlipidemia Daughter     ROS:  Pertinent items are noted in HPI.  Otherwise, a comprehensive ROS was negative.  Exam:   BP 112/78 (BP Location: Right Arm, Patient Position: Sitting, Cuff Size: Large)   Pulse 80   Resp 16   Ht 5' 4.75" (1.645 m)   Wt 168 lb (76.2 kg)   BMI 28.17 kg/m     Height: 5' 4.75" (164.5 cm)  Ht Readings from Last 3 Encounters:  02/07/17 5' 4.75" (1.645 m)  10/25/14  5\' 4"  (1.626 m)  10/04/13 5' 5.5" (1.664 m)    General appearance: alert, cooperative and appears stated age Head: Normocephalic, without obvious abnormality, atraumatic Neck: no adenopathy, supple, symmetrical, trachea midline and thyroid normal to inspection and palpation Lungs: clear to auscultation bilaterally Breasts: normal appearance, no masses or tenderness Heart: regular rate and rhythm Abdomen: soft, non-tender; bowel sounds normal; no masses,  no organomegaly Extremities: extremities normal, atraumatic, no cyanosis or edema Skin: Skin color, texture, turgor normal. No rashes or lesions Lymph nodes: Cervical, supraclavicular, and axillary nodes normal. No abnormal inguinal nodes palpated Neurologic: Grossly normal   Pelvic: External genitalia:  no lesions              Urethra:  normal appearing urethra with no masses, tenderness or lesions              Bartholins and Skenes: normal                 Vagina: normal appearing  vagina with normal color and discharge, no lesions              Cervix: no lesions              Pap taken: No. Bimanual Exam:  Uterus:  normal size, contour, position, consistency, mobility, non-tender              Adnexa: normal adnexa and no mass, fullness, tenderness               Rectovaginal: Confirms               Anus:  normal sphincter tone, no lesions  Chaperone was present for exam.  A:  Well Woman with normal exam PMP, no HRT Hypertension Elevated lipids Family hx of breast cancer with mother age 81 H/O left ovarian cyst 3.3 x 40 x 3.0 4/14.  Had recent CT showing this to be larger  P:   Mammogram guidelines discussed.  She does not want to continue doing MMG pap smear not indicated Plan repeat PUS Vaccines and lab work done with Dr.Tisovec return annually or prn

## 2017-02-08 ENCOUNTER — Telehealth: Payer: Self-pay | Admitting: Obstetrics & Gynecology

## 2017-02-08 NOTE — Telephone Encounter (Signed)
Spoke with patient. Patient request transabdominal US vs transvaginal. Patient states last PUS was uncomfortable. Advised patient can do transabdominal US, will need to drink 32oz of water one hour prior to appointment and arrive with full bladder.   Advised patient will review with Dr. Hyacinth MeekerMiller and return call with any additional recommendations. Patient is agreeable and verbalizes understanding.   Dr. Hyacinth MeekerMiller - ok to proceed as transabdominal US, for f/u of left ovarian cyst?   Cc: Harland DingwallSuzy Dixon

## 2017-02-08 NOTE — Telephone Encounter (Signed)
Yes, ok to do transabdominal ultrasound.  I think we will be able to see enough.  Thanks.

## 2017-02-08 NOTE — Telephone Encounter (Signed)
Spoke with patient, advised as seen below per Dr. Hyacinth MeekerMiller. Reminded patient to arrive with full bladder, drink 32 oz water 1 hour prior. Patient thankful and verbalizes understanding. Will close encounter.

## 2017-02-08 NOTE — Telephone Encounter (Signed)
Spoke with patient regarding benefit for ultrasound. Patient understood and agreeable. Patient scheduled 03/01/17 with Dr Hyacinth MeekerMiller. Patient aware of appointment date, arrival time and cancellation policy. Patient had additional questions regarding ultrasound. Call routed to Carmelina DaneJill Hamm, RN to address additional questions.   Routing to Carmelina DaneJill Hamm, RCharity fundraiser

## 2017-02-22 ENCOUNTER — Telehealth: Payer: Self-pay | Admitting: Obstetrics & Gynecology

## 2017-02-22 NOTE — Telephone Encounter (Signed)
Returned call to patient. Answered all billing and insurance questions. Patient has no further questions. Ok to close

## 2017-02-22 NOTE — Telephone Encounter (Signed)
Patient would like to speak with nurse about her ultrasound appointment on 03/01/17.

## 2017-02-22 NOTE — Telephone Encounter (Signed)
Spoke with patient. Patient has insurance and billing questions. Advised will have insurance and billing department return call. Patient is agreeable.  Routing to Prax[employer]Suzy Dixon and [employer]osa Davis

## 2017-03-01 ENCOUNTER — Ambulatory Visit (INDEPENDENT_AMBULATORY_CARE_PROVIDER_SITE_OTHER): Payer: Medicare Other

## 2017-03-01 ENCOUNTER — Ambulatory Visit (INDEPENDENT_AMBULATORY_CARE_PROVIDER_SITE_OTHER): Payer: Medicare Other | Admitting: Obstetrics & Gynecology

## 2017-03-01 VITALS — BP 140/90 | HR 76 | Resp 14 | Ht 64.75 in | Wt 168.0 lb

## 2017-03-01 DIAGNOSIS — N83202 Unspecified ovarian cyst, left side: Secondary | ICD-10-CM

## 2017-03-01 NOTE — Progress Notes (Signed)
83 y.o. [id number] Divorced Caucasian female here for pelvic ultrasound due to history of ovarian cyst noted on CT on 02/02/16.  This was done due to RLQ pain.  CT noted left ovarian cyst on left ovary measuring 4.6cm.  This has been noted in the past and was monitored with ultrasound in 2012-2014.  At that time ovary was about 3 x 3 x 4.1cm with 30 x 26 x 22mm cyst.    No LMP recorded. Patient is postmenopausal.  Findings:  UTERUS: 5.5 x 1.8 x 4.5cm with calcifications EMS 1.448mm.  Small amount of fluid is within cavity measuring 0.8 x 0.432mm.  This was noted prior and is stable since 2012. ADNEXA: Left ovary: 4.7 x 3.0 x 3.2cm complex cyst.  (this is compared to 2014 where cyst measured 3 x 3 x 4.1cm).  No significant change since 10/17, either.  Internal, thickened septations with calcifications.  Avascular.   Right ovary: 1.5 x 1.2 x 1.2cm CUL DE SAC:  No free fluid  Discussion:  Findings reviewed with pt.  Images from 2014 and 2017 reviewed with pt.  There is minimal change since 2012-2014 but this is just a few millimeters and pt is not interested in any surgery unless there is something "significant".  I think ok to watch.  Pt very comfortable with plan.  Repeat in 1 year.  Assessment:  Complex left ovary cyst  Plan:  Repeat PUS 1 year.  Pt knows to call if has new onset of LLQ pain.  Voices clear understanding.  ~15 minutes spent with patient >50% of time was in face to face discussion of above.

## 2017-03-04 ENCOUNTER — Encounter: Payer: Self-pay | Admitting: Obstetrics & Gynecology

## 2017-03-04 DIAGNOSIS — N83202 Unspecified ovarian cyst, left side: Secondary | ICD-10-CM | POA: Insufficient documentation

## 2018-03-07 ENCOUNTER — Encounter: Payer: Self-pay | Admitting: Obstetrics & Gynecology

## 2018-03-07 ENCOUNTER — Other Ambulatory Visit: Payer: Self-pay

## 2018-03-07 ENCOUNTER — Ambulatory Visit (INDEPENDENT_AMBULATORY_CARE_PROVIDER_SITE_OTHER): Payer: Medicare Other | Admitting: Obstetrics & Gynecology

## 2018-03-07 VITALS — BP 120/70 | HR 96 | Resp 16 | Ht 64.0 in | Wt 167.2 lb

## 2018-03-07 DIAGNOSIS — Z01419 Encounter for gynecological examination (general) (routine) without abnormal findings: Secondary | ICD-10-CM

## 2018-03-07 NOTE — Progress Notes (Signed)
84 y.o. [id number] Divorced White or Caucasian female here for annual exam.  Doing well.  No vaginal bleeding.  Had appt recently with Dr. Wylene Simmer.  This was a six month follow-up.    Has not gotten a flu shot.    Had CT scan in 2017.  This showed ovarian cyst on the left ovary that was seen years previously.  Repeat PUS 11/18 showed minimal increase in size.    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  She just misses them all so much.  She is now playing with a younger group-everyone is 45 or younger.    Pt does have know ovarian cyst on the left ovary.  This is complex in nature but has been present in 2012.  I have never been able to feel it on exam.  Ultrasound 11/18 showed cyst measuring 3.7 x 3.0 x 3.2cm.  It is avascular.  There are thickened septations that contain calcifications.  We discussed last year repeating this.  She declines today.  States she wouldn't do anything even if it were "big, bad and ugly".  In fact, states she wondered why she even came today.  Is not really interested in future mammography even if I found something on exam today as well.  States she would decline this.    No LMP recorded. Patient is postmenopausal.          Sexually active: No.  The current method of family planning is abstinence.    Exercising: Yes.    walk Smoker:  no  Health Maintenance: Pap:  2011 Normal  History of abnormal Pap:  no MMG:  04/02/11 BIRADS1:neg  Colonoscopy:  2012  BMD:   03/13/08 osteopenia  TDaP:  2005 (pt declines and states she doesn't like injections) Pneumonia vaccine(s):  No Shingrix: No Hep C testing: n/a Screening Labs: PCP   reports that she has quit smoking. Her smoking use included cigarettes. She has never used smokeless tobacco. She reports that she does not drink alcohol or use drugs.  Past Medical History:  Diagnosis Date  . Closed fracture of humerus 08/05/13   pt fell while walking down the street  . Complex  ovarian cyst 2011   Left side.  Followed by ultrasound.  . Depression   . Diverticulosis   . Esophageal stricture   . GERD (gastroesophageal reflux disease)   . Hemorrhoids   . Hiatal hernia   . HTN (hypertension)   . Hyperlipidemia   . Hypothyroidism   . Obesity   . Osteopenia 2010    Past Surgical History:  Procedure Laterality Date  . CHOLECYSTECTOMY    . MOHS SURGERY  1990  . TUBAL LIGATION      Current Outpatient Medications  Medication Sig Dispense Refill  . aspirin 81 MG tablet Take 81 mg by mouth daily.    . hydrochlorothiazide (MICROZIDE) 12.5 MG capsule Take 12.5 mg by mouth daily.    Marland Kitchen levothyroxine (SYNTHROID, LEVOTHROID) 88 MCG tablet Take 88 mcg by mouth daily.    Marland Kitchen olmesartan (BENICAR) 40 MG tablet Take 40 mg by mouth daily.  3  . potassium chloride (K-DUR) 10 MEQ tablet Take 10 mEq by mouth daily.    . pravastatin (PRAVACHOL) 20 MG tablet Take 20 mg by mouth daily.    Marland Kitchen triamcinolone (KENALOG) 0.025 % cream APPLY ONE APPLICATION ON THE SKIN TWICE DAILY TO RASH FOR 2 WEEKS  0   No current facility-administered  medications for this visit.     Family History  Problem Relation Age of Onset  . Clotting disorder Father   . Depression Father   . Breast cancer Mother   . Colon cancer Son   . Colon polyps Daughter   . Hyperlipidemia Son        x2   . Hypertension Son        x 2  . Hyperlipidemia Daughter     Review of Systems  Skin:       Itching   All other systems reviewed and are negative.   Exam:   BP 120/70 (BP Location: Left Arm, Patient Position: Sitting, Cuff Size: Large)   Pulse 96   Resp 16   Ht 5\' 4"  (1.626 m)   Wt 167 lb 3.2 oz (75.8 kg)   BMI 28.70 kg/m    Height: 5\' 4"  (162.6 cm)  Ht Readings from Last 3 Encounters:  03/07/18 5\' 4"  (1.626 m)  03/01/17 5' 4.75" (1.645 m)  02/07/17 5' 4.75" (1.645 m)    General appearance: alert, cooperative and appears stated age Head: Normocephalic, without obvious abnormality,  atraumatic Neck: no adenopathy, supple, symmetrical, trachea midline and thyroid normal to inspection and palpation Lungs: clear to auscultation bilaterally Breasts: normal appearance, no masses or tenderness Heart: regular rate and rhythm Abdomen: soft, non-tender; bowel sounds normal; no masses,  no organomegaly Extremities: extremities normal, atraumatic, no cyanosis or edema Skin: Skin color, texture, turgor normal. No rashes or lesions Lymph nodes: Cervical, supraclavicular, and axillary nodes normal. No abnormal inguinal nodes palpated Neurologic: Grossly normal   Pelvic: External genitalia:  no lesions              Urethra:  normal appearing urethra with no masses, tenderness or lesions              Bartholins and Skenes: normal                 Vagina: normal appearing vagina with normal color and discharge, no lesions              Cervix: no lesions              Pap taken: No. Bimanual Exam:  Uterus:  normal size, contour, position, consistency, mobility, non-tender              Adnexa: normal adnexa and no mass, fullness, tenderness               Rectovaginal: Confirms               Anus:  normal sphincter tone, no lesions  Chaperone was present for exam.  A:  Well Woman with normal exam PMP, no HRT Hypertension H/o elevated lipids Family hx of breast cancer with mother age 480 (is sure she would not undergo treatment her mother had if pt ever was diagnosed with breast cancer) H/O left ovarian cyst measuring 4.7 x 3.0 x 3.2.    P:   Mammogram guidelines reviewed.  Pt would not have a mammogram even if I found a mass on exam pap smear not indicated D/w pt repeating PUS this year.  Declines.  States she wouldn't have any treatment.  Asked her to consider this and if changes her mind, would schedule PUS (as cyst was a little larger last year). return annually or prn

## 2018-10-09 ENCOUNTER — Other Ambulatory Visit: Payer: Self-pay

## 2018-10-09 ENCOUNTER — Ambulatory Visit (HOSPITAL_COMMUNITY)
Admission: RE | Admit: 2018-10-09 | Discharge: 2018-10-09 | Disposition: A | Discharge status: Home / Self Care | Payer: Medicare Other | Source: Ambulatory Visit | Attending: Family | Admitting: Family

## 2018-10-09 ENCOUNTER — Other Ambulatory Visit (HOSPITAL_COMMUNITY): Payer: Self-pay | Admitting: Internal Medicine

## 2018-10-09 DIAGNOSIS — I779 Disorder of arteries and arterioles, unspecified: Secondary | ICD-10-CM | POA: Diagnosis present

## 2021-11-15 ENCOUNTER — Other Ambulatory Visit: Payer: Self-pay

## 2021-11-15 ENCOUNTER — Encounter (HOSPITAL_COMMUNITY): Payer: Self-pay

## 2021-11-15 ENCOUNTER — Emergency Department (HOSPITAL_COMMUNITY): Payer: Medicare Other

## 2021-11-15 ENCOUNTER — Emergency Department (HOSPITAL_COMMUNITY)
Admission: EM | Admit: 2021-11-15 | Discharge: 2021-11-15 | Disposition: A | Discharge status: Home / Self Care | Payer: Medicare Other | Attending: Emergency Medicine | Admitting: Emergency Medicine

## 2021-11-15 DIAGNOSIS — Z23 Encounter for immunization: Secondary | ICD-10-CM | POA: Diagnosis not present

## 2021-11-15 DIAGNOSIS — W010XXA Fall on same level from slipping, tripping and stumbling without subsequent striking against object, initial encounter: Secondary | ICD-10-CM | POA: Diagnosis not present

## 2021-11-15 DIAGNOSIS — S81011A Laceration without foreign body, right knee, initial encounter: Secondary | ICD-10-CM | POA: Insufficient documentation

## 2021-11-15 DIAGNOSIS — Z7982 Long term (current) use of aspirin: Secondary | ICD-10-CM | POA: Insufficient documentation

## 2021-11-15 DIAGNOSIS — S8991XA Unspecified injury of right lower leg, initial encounter: Secondary | ICD-10-CM | POA: Diagnosis present

## 2021-11-15 MED ORDER — TETANUS-DIPHTH-ACELL PERTUSSIS 5-2.5-18.5 LF-MCG/0.5 IM SUSY
0.5000 mL | PREFILLED_SYRINGE | Freq: Once | INTRAMUSCULAR | Status: AC
  Administered 2021-11-15: 0.5 mL via INTRAMUSCULAR
  Filled 2021-11-15: qty 0.5

## 2021-11-15 MED ORDER — LIDOCAINE-EPINEPHRINE (PF) 2 %-1:200000 IJ SOLN
10.0000 mL | Freq: Once | INTRAMUSCULAR | Status: AC
  Administered 2021-11-15: 10 mL
  Filled 2021-11-15: qty 20

## 2021-11-15 NOTE — Progress Notes (Signed)
Orthopedic Tech Progress Note Patient Details:  Tiffany Rice 10-Jun-1931 [id number]  Ortho Devices Type of Ortho Device: Knee Immobilizer Ortho Device/Splint Location: RLE Ortho Device/Splint Interventions: Application   Post Interventions Patient Tolerated: Well  [name] Tiffany Rice 11/15/2021, 3:32 PM

## 2021-11-15 NOTE — ED Provider Notes (Signed)
[facility]-EMERGENCY DEPT Provider Note   CSN: [id number] Arrival date & time: 11/15/21  1141     History Chief Complaint  Patient presents with   Laceration    Tiffany Rice is a 88 y.o. female patient who presents to the emergency department today for further evaluation of a right knee laceration that occurred just prior to arrival.  Patient states she was walking when she had a mechanical trip and fall outside [employer] and fell scraping her right knee.  Patient does not take blood thinners.  She did not hit her head or lose consciousness.  She denies any other injury.  Unknown tetanus.   Laceration      Home Medications Prior to Admission medications   Medication Sig Start Date End Date Taking? Authorizing Provider  aspirin 81 MG tablet Take 81 mg by mouth daily.    [provider]  hydrochlorothiazide (MICROZIDE) 12.5 MG capsule Take 12.5 mg by mouth daily.    [provider]  levothyroxine (SYNTHROID, LEVOTHROID) 88 MCG tablet Take 88 mcg by mouth daily.    [provider]  olmesartan (BENICAR) 40 MG tablet Take 40 mg by mouth daily. 02/22/18   [provider]  potassium chloride (K-DUR) 10 MEQ tablet Take 10 mEq by mouth daily.    [provider]  pravastatin (PRAVACHOL) 20 MG tablet Take 20 mg by mouth daily.    [provider]  triamcinolone (KENALOG) 0.025 % cream APPLY ONE APPLICATION ON THE SKIN TWICE DAILY TO RASH FOR 2 WEEKS 01/25/18   [provider]      Allergies    Sulfamethoxazole-trimethoprim    Review of Systems   Review of Systems  All other systems reviewed and are negative.   Physical Exam Updated Vital Signs BP (!) 189/92 (BP Location: Right Arm)   Pulse 74   Temp 98.3 F (36.8 C) (Oral)   Resp 15   Ht 5\' 4"  (1.626 m)   Wt 75.8 kg   SpO2 99%   BMI 28.68 kg/m  Physical Exam Vitals and nursing note reviewed.  Constitutional:      Appearance: Normal  appearance.  HENT:     Head: Normocephalic and atraumatic.  Eyes:     General:        Right eye: No discharge.        Left eye: No discharge.     Conjunctiva/sclera: Conjunctivae normal.  Pulmonary:     Effort: Pulmonary effort is normal.  Skin:    General: Skin is warm and dry.     Findings: No rash.     Comments: 12 cm gaping laceration over the right anterior knee.  Neurological:     General: No focal deficit present.     Mental Status: She is alert.  Psychiatric:        Mood and Affect: Mood normal.        Behavior: Behavior normal.     ED Results / Procedures / Treatments   Labs (all labs ordered are listed, but only abnormal results are displayed) Labs Reviewed - No data to display  EKG None  Radiology DG Knee Complete 4 Views Right  Result Date: 11/15/2021 CLINICAL DATA:  88 year old female with RIGHT knee injury and pain. EXAM: RIGHT KNEE - COMPLETE 4+ VIEW COMPARISON:  None Available. FINDINGS: No evidence of fracture, dislocation, or joint effusion. No evidence of arthropathy or other focal bone abnormality. Prepatellar soft tissue irregularity noted compatible with soft tissue injury/laceration.  IMPRESSION: Prepatellar soft tissue injury/laceration. No acute bony abnormality. Electronically Signed   By: Harmon Pier M.D.   On: 11/15/2021 13:33    Procedures Procedures    Medications Ordered in ED Medications  lidocaine-EPINEPHrine (XYLOCAINE W/EPI) 2 %-1:200000 (PF) injection 10 mL (10 mLs Infiltration Given 11/15/21 1445)  Tdap (BOOSTRIX) injection 0.5 mL (0.5 mLs Intramuscular Given 11/15/21 1332)    ED Course/ Medical Decision Making/ A&P Clinical Course as of 11/15/21 1509  Sun Nov 15, 2021  3160 88 year old female mechanical fall injury to right knee.  She has a large laceration over her knee.  Extensor mechanism is intact.  No gross foreign bodies.  Will need good irrigation and skin closure. [MB]    Clinical Course User Index [MB] Terrilee Files,  MD                           Medical Decision Making Tiffany Rice is a 88 y.o. female who presents to the emergency department today for further evaluation of a right knee laceration secondary to fall.  We will get imaging of the knee to evaluate for foreign body.  I will plan to repair at the knee with sutures.  Imaging did not show any evidence of foreign body.  Wound was repaired by my colleague Schutt PA-C.  Please see his note for full details.  I will place the patient in a knee immobilizer to reduce strain on the sutures.  I will have her follow-up with her primary care doctor or return to the emerged from in 710 days for suture removal.  Discussed wound care with her at the bedside.  All questions or concerns addressed.  Strict return precautions discussed.  She is safe for discharge.   Amount and/or Complexity of Data Reviewed Radiology: ordered.  Risk Prescription drug management.    Final Clinical Impression(s) / ED Diagnoses Final diagnoses:  Laceration of right knee, initial encounter    Rx / DC Orders ED Discharge Orders     None         Jolyn Lent 11/15/21 1510    Terrilee Files, MD 11/15/21 [phone]

## 2021-11-15 NOTE — ED Provider Notes (Signed)
..  Laceration Repair  Date/Time: 11/15/2021 2:49 PM  Performed by: Michelle Piper, PA-C Authorized by: Michelle Piper, PA-C   Consent:    Consent obtained:  Verbal   Consent given by:  Patient   Risks, benefits, and alternatives were discussed: yes     Risks discussed:  Infection, pain, poor cosmetic result and poor wound healing   Alternatives discussed:  No treatment Universal protocol:    Procedure explained and questions answered to patient or proxy's satisfaction: yes     Relevant documents present and verified: yes     Imaging studies available: yes     Required blood products, implants, devices, and special equipment available: yes     Immediately prior to procedure, a time out was called: yes     Patient identity confirmed:  Verbally with patient Anesthesia:    Anesthesia method:  Local infiltration   Local anesthetic:  Lidocaine 1% WITH epi Laceration details:    Location:  Leg   Leg location:  R knee   Length (cm):  10 Pre-procedure details:    Preparation:  Patient was prepped and draped in usual sterile fashion Exploration:    Limited defect created (wound extended): yes     Hemostasis achieved with:  Epinephrine   Imaging obtained: x-ray     Imaging outcome: foreign body not noted     Wound exploration: entire depth of wound visualized     Wound extent: no foreign bodies/material noted, no tendon damage noted and no underlying fracture noted     Contaminated: no   Treatment:    Area cleansed with:  Povidone-iodine and saline   Amount of cleaning:  Extensive   Irrigation solution:  Sterile saline   Irrigation method:  Pressure wash   Visualized foreign bodies/material removed: no     Debridement:  None   Undermining:  None   Scar revision: no   Skin repair:    Repair method:  Sutures   Suture size:  3-0   Suture material:  Prolene   Suture technique:  Simple interrupted   Number of sutures:  8 Approximation:    Approximation:  Close Repair  type:    Repair type:  Simple Post-procedure details:    Dressing:  Non-adherent dressing   Procedure completion:  Ned Grace, PA-C 11/15/21 1453    Derwood Kaplan, MD 11/18/21 [phone]

## 2021-11-15 NOTE — Discharge Instructions (Signed)
Please stay in the knee immobilizer while you walk.  Limit bending the knee.  Please follow-up with your primary care doctor or the emergency room in 7 to 10 days for suture removal.  Do not submerge wound in water.  You may shower normally.

## 2021-12-07 ENCOUNTER — Ambulatory Visit (HOSPITAL_COMMUNITY)
Admission: EM | Admit: 2021-12-07 | Discharge: 2021-12-07 | Disposition: A | Discharge status: Home / Self Care | Payer: Medicare Other | Attending: Emergency Medicine | Admitting: Emergency Medicine

## 2021-12-07 ENCOUNTER — Encounter (HOSPITAL_COMMUNITY): Payer: Self-pay

## 2021-12-07 DIAGNOSIS — S81011D Laceration without foreign body, right knee, subsequent encounter: Secondary | ICD-10-CM | POA: Diagnosis not present

## 2021-12-07 MED ORDER — CEPHALEXIN 500 MG PO CAPS: 500.0000 mg | ORAL_CAPSULE | Freq: Two times a day (BID) | ORAL | 0 refills | Status: AC

## 2021-12-07 NOTE — ED Provider Notes (Signed)
[facility]    CSN: [id number] Arrival date & time: 12/07/21  1153      History   Chief Complaint Chief Complaint  Patient presents with   Laceration    HPI Tiffany Rice is a 88 y.o. female.   Patient presents for evaluation of a laceration to the right knee that occurred 3 weeks ago after fall.  Was initially evaluated in the emergency department where 8 sutures were placed, suturing was removed 2 weeks ago by PCP, completed doxycycline course and initial time of injury to prevent infection.  Patient denies erythema, tenderness, swelling but has seen a small amount of drainage.  Been cleaning site daily with soap and water, applying topical antibiotic cream and covering with dressing, endorses that she does leave open to air at times while home.  Past Medical History:  Diagnosis Date   Closed fracture of humerus 08/05/13   pt fell while walking down the street   Complex ovarian cyst 2011   Left side.  Followed by ultrasound.   Depression    Diverticulosis    Esophageal stricture    GERD (gastroesophageal reflux disease)    Hemorrhoids    Hiatal hernia    HTN (hypertension)    Hyperlipidemia    Hypothyroidism    Obesity    Osteopenia 2010    Patient Active Problem List   Diagnosis Date Noted   Cyst of left ovary 03/04/2017   Essential hypertension 02/15/2013   Hyperlipidemia 02/15/2013   Hypothyroidism 02/15/2013   CKD (chronic kidney disease) stage 2, GFR 60-89 ml/min 02/15/2013   Chest pain on exertion 02/15/2013    Past Surgical History:  Procedure Laterality Date   CHOLECYSTECTOMY     MOHS SURGERY  1990   TUBAL LIGATION      OB History     Gravida  6   Para  3   Term  3   Preterm  0   AB  3   Living  3      SAB      IAB      Ectopic  0   Multiple  0   Live Births               Home Medications    Prior to Admission medications   Medication Sig Start Date End Date Taking? Authorizing Provider   cephALEXin (KEFLEX) 500 MG capsule Take 1 capsule (500 mg total) by mouth 2 (two) times daily for 5 days. 12/07/21 12/12/21 Yes Keilynn Marano, Elita Boone, NP  aspirin 81 MG tablet Take 81 mg by mouth daily.    [provider]  hydrochlorothiazide (MICROZIDE) 12.5 MG capsule Take 12.5 mg by mouth daily.    [provider]  levothyroxine (SYNTHROID, LEVOTHROID) 88 MCG tablet Take 88 mcg by mouth daily.    [provider]  olmesartan (BENICAR) 40 MG tablet Take 40 mg by mouth daily. 02/22/18   [provider]  potassium chloride (K-DUR) 10 MEQ tablet Take 10 mEq by mouth daily.    [provider]  pravastatin (PRAVACHOL) 20 MG tablet Take 20 mg by mouth daily.    [provider]  triamcinolone (KENALOG) 0.025 % cream APPLY ONE APPLICATION ON THE SKIN TWICE DAILY TO RASH FOR 2 WEEKS 01/25/18   [provider]    Family History Family History  Problem Relation Age of Onset   Clotting disorder Father    Depression Father    Breast cancer Mother  Colon cancer Son    Colon polyps Daughter    Hyperlipidemia Son        x2    Hypertension Son        x 2   Hyperlipidemia Daughter     Social History Social History   Tobacco Use   Smoking status: Former    Types: Cigarettes   Smokeless tobacco: Never  Vaping Use   Vaping Use: Never used  Substance Use Topics   Alcohol use: No   Drug use: No     Allergies   Sulfamethoxazole-trimethoprim   Review of Systems Review of Systems  Constitutional: Negative.   Respiratory: Negative.    Cardiovascular: Negative.   Skin:  Positive for wound. Negative for color change, pallor and rash.  Neurological: Negative.      Physical Exam Triage Vital Signs ED Triage Vitals  Enc Vitals Group     BP --      Pulse Rate 12/07/21 1229 90     Resp 12/07/21 1229 12     Temp 12/07/21 1229 97.6 F (36.4 C)     Temp Source 12/07/21 1229 Oral     SpO2 12/07/21 1229 98 %     Weight --       Height --      Head Circumference --      Peak Flow --      Pain Score 12/07/21 1228 0     Pain Loc --      Pain Edu? --      Excl. in GC? --    No data found.  Updated Vital Signs Pulse 90   Temp 97.6 F (36.4 C) (Oral)   Resp 12   SpO2 98%   Visual Acuity Right Eye Distance:   Left Eye Distance:   Bilateral Distance:    Right Eye Near:   Left Eye Near:    Bilateral Near:     Physical Exam Constitutional:      Appearance: Normal appearance.  Eyes:     Extraocular Movements: Extraocular movements intact.  Pulmonary:     Effort: Pulmonary effort is normal.  Skin:    Comments: Defer to photo  Neurological:     Mental Status: She is alert and oriented to person, place, and time. Mental status is at baseline.  Psychiatric:        Mood and Affect: Mood normal.        Behavior: Behavior normal.       UC Treatments / Results  Labs (all labs ordered are listed, but only abnormal results are displayed) Labs Reviewed - No data to display  EKG   Radiology No results found.  Procedures Procedures (including critical care time)  Medications Ordered in UC Medications - No data to display  Initial Impression / Assessment and Plan / UC Course  I have reviewed the triage vital signs and the nursing notes.  Pertinent labs & imaging results that were available during my care of the patient were reviewed by me and considered in my medical decision making (see chart for details).  Right knee laceration, subsequent encounter  Site appears to be healing appropriately however scant yellow drainage is able to be expelled from the wound bed and therefore we will prophylactically cover for infection, no erythema, swelling or tenderness is noted on exam, Keflex 5-day course prescribed and recommended continued daily cleansing with diluted soapy water, patting dry and may continue use of topical mupirocin ointment with covering if at  risk for contamination with nonadherent  dressing, may follow-up with PCP if continued concerns after completion of antibiotics  Final Clinical Impressions(s) / UC Diagnoses   Final diagnoses:  Knee laceration, right, subsequent encounter     Discharge Instructions      While in the is continuing to heal I do have concerns about the yellow drainage that is within the wound bed and therefore I will start her on antibiotics today to cover for infection  Take Keflex every morning and every evening for 5 days  You may continue to clean with diluted soapy water, pat dry, apply antibiotic cream and cover with a nonstick dressing until completely closed  If you continue to have concerns about healing you may follow-up with your primary doctor for reevaluation or urgent care as needed   ED Prescriptions     Medication Sig Dispense Auth. Provider   cephALEXin (KEFLEX) 500 MG capsule Take 1 capsule (500 mg total) by mouth 2 (two) times daily for 5 days. 10 capsule Valinda Hoar, NP      PDMP not reviewed this encounter.   Valinda Hoar, NP 12/07/21 1249

## 2021-12-07 NOTE — ED Triage Notes (Signed)
Pt is here for follow up cut on right knee . Pt is concerned it may be infected . Pt had sutures put in 3wks ago and removed , since pt thinks it may be infected.

## 2021-12-07 NOTE — Discharge Instructions (Signed)
While in the is continuing to heal I do have concerns about the yellow drainage that is within the wound bed and therefore I will start her on antibiotics today to cover for infection  Take Keflex every morning and every evening for 5 days  You may continue to clean with diluted soapy water, pat dry, apply antibiotic cream and cover with a nonstick dressing until completely closed  If you continue to have concerns about healing you may follow-up with your primary doctor for reevaluation or urgent care as needed

## 2022-06-08 ENCOUNTER — Inpatient Hospital Stay (HOSPITAL_COMMUNITY)
Admission: EM | Admit: 2022-06-08 | Discharge: 2022-06-13 | DRG: 065 | Disposition: A | Discharge status: Skilled Nursing Facility | Payer: Medicare Other | Attending: Internal Medicine | Admitting: Internal Medicine

## 2022-06-08 ENCOUNTER — Observation Stay (HOSPITAL_COMMUNITY): Payer: Medicare Other

## 2022-06-08 ENCOUNTER — Emergency Department (HOSPITAL_COMMUNITY): Payer: Medicare Other

## 2022-06-08 DIAGNOSIS — Z800 Family history of malignant neoplasm of digestive organs: Secondary | ICD-10-CM | POA: Diagnosis not present

## 2022-06-08 DIAGNOSIS — I10 Essential (primary) hypertension: Secondary | ICD-10-CM

## 2022-06-08 DIAGNOSIS — I2541 Coronary artery aneurysm: Secondary | ICD-10-CM | POA: Diagnosis not present

## 2022-06-08 DIAGNOSIS — R29810 Facial weakness: Secondary | ICD-10-CM | POA: Diagnosis present

## 2022-06-08 DIAGNOSIS — I119 Hypertensive heart disease without heart failure: Secondary | ICD-10-CM | POA: Diagnosis present

## 2022-06-08 DIAGNOSIS — R471 Dysarthria and anarthria: Secondary | ICD-10-CM | POA: Diagnosis present

## 2022-06-08 DIAGNOSIS — I6381 Other cerebral infarction due to occlusion or stenosis of small artery: Principal | ICD-10-CM | POA: Diagnosis present

## 2022-06-08 DIAGNOSIS — Z803 Family history of malignant neoplasm of breast: Secondary | ICD-10-CM

## 2022-06-08 DIAGNOSIS — Z87891 Personal history of nicotine dependence: Secondary | ICD-10-CM

## 2022-06-08 DIAGNOSIS — Z832 Family history of diseases of the blood and blood-forming organs and certain disorders involving the immune mechanism: Secondary | ICD-10-CM

## 2022-06-08 DIAGNOSIS — Z818 Family history of other mental and behavioral disorders: Secondary | ICD-10-CM | POA: Diagnosis not present

## 2022-06-08 DIAGNOSIS — Z79890 Hormone replacement therapy (postmenopausal): Secondary | ICD-10-CM

## 2022-06-08 DIAGNOSIS — R29702 NIHSS score 2: Secondary | ICD-10-CM | POA: Diagnosis present

## 2022-06-08 DIAGNOSIS — W19XXXA Unspecified fall, initial encounter: Secondary | ICD-10-CM | POA: Diagnosis present

## 2022-06-08 DIAGNOSIS — E785 Hyperlipidemia, unspecified: Secondary | ICD-10-CM | POA: Diagnosis present

## 2022-06-08 DIAGNOSIS — I161 Hypertensive emergency: Secondary | ICD-10-CM | POA: Diagnosis present

## 2022-06-08 DIAGNOSIS — K219 Gastro-esophageal reflux disease without esophagitis: Secondary | ICD-10-CM | POA: Diagnosis present

## 2022-06-08 DIAGNOSIS — F32A Depression, unspecified: Secondary | ICD-10-CM | POA: Diagnosis present

## 2022-06-08 DIAGNOSIS — Z8249 Family history of ischemic heart disease and other diseases of the circulatory system: Secondary | ICD-10-CM | POA: Diagnosis not present

## 2022-06-08 DIAGNOSIS — I725 Aneurysm of other precerebral arteries: Secondary | ICD-10-CM | POA: Diagnosis present

## 2022-06-08 DIAGNOSIS — I63311 Cerebral infarction due to thrombosis of right middle cerebral artery: Secondary | ICD-10-CM

## 2022-06-08 DIAGNOSIS — E039 Hypothyroidism, unspecified: Secondary | ICD-10-CM | POA: Diagnosis present

## 2022-06-08 DIAGNOSIS — R4701 Aphasia: Secondary | ICD-10-CM | POA: Diagnosis present

## 2022-06-08 DIAGNOSIS — Z83719 Family history of colon polyps, unspecified: Secondary | ICD-10-CM

## 2022-06-08 DIAGNOSIS — Z79899 Other long term (current) drug therapy: Secondary | ICD-10-CM

## 2022-06-08 DIAGNOSIS — G8194 Hemiplegia, unspecified affecting left nondominant side: Secondary | ICD-10-CM | POA: Diagnosis present

## 2022-06-08 DIAGNOSIS — R4781 Slurred speech: Secondary | ICD-10-CM | POA: Diagnosis not present

## 2022-06-08 DIAGNOSIS — Z882 Allergy status to sulfonamides status: Secondary | ICD-10-CM

## 2022-06-08 DIAGNOSIS — M6281 Muscle weakness (generalized): Secondary | ICD-10-CM | POA: Diagnosis present

## 2022-06-08 DIAGNOSIS — R2990 Unspecified symptoms and signs involving the nervous system: Secondary | ICD-10-CM | POA: Diagnosis not present

## 2022-06-08 DIAGNOSIS — I639 Cerebral infarction, unspecified: Secondary | ICD-10-CM | POA: Diagnosis present

## 2022-06-08 DIAGNOSIS — Z66 Do not resuscitate: Secondary | ICD-10-CM | POA: Diagnosis present

## 2022-06-08 DIAGNOSIS — Y92007 Garden or yard of unspecified non-institutional (private) residence as the place of occurrence of the external cause: Secondary | ICD-10-CM | POA: Diagnosis not present

## 2022-06-08 DIAGNOSIS — I509 Heart failure, unspecified: Secondary | ICD-10-CM | POA: Diagnosis not present

## 2022-06-08 LAB — CBC
HCT: 41.8 % (ref 36.0–46.0)
HCT: 45.7 % (ref 36.0–46.0)
Hemoglobin: 13.7 g/dL (ref 12.0–15.0)
Hemoglobin: 16 g/dL — ABNORMAL HIGH (ref 12.0–15.0)
MCH: 29.3 pg (ref 26.0–34.0)
MCH: 30.1 pg (ref 26.0–34.0)
MCHC: 32.8 g/dL (ref 30.0–36.0)
MCHC: 35 g/dL (ref 30.0–36.0)
MCV: 89.5 fL (ref 80.0–100.0)
MCV: 86.1 fL (ref 80.0–100.0)
Platelets: 198 K/uL (ref 150–400)
Platelets: 209 10*3/uL (ref 150–400)
RBC: 4.67 MIL/uL (ref 3.87–5.11)
RBC: 5.31 MIL/uL — ABNORMAL HIGH (ref 3.87–5.11)
RDW: 13.2 % (ref 11.5–15.5)
RDW: 13.2 % (ref 11.5–15.5)
WBC: 5.3 K/uL (ref 4.0–10.5)
WBC: 5.1 10*3/uL (ref 4.0–10.5)
nRBC: 0 % (ref 0.0–0.2)
nRBC: 0 % (ref 0.0–0.2)

## 2022-06-08 LAB — BASIC METABOLIC PANEL
Anion gap: 7 (ref 5–15)
BUN: 8 mg/dL (ref 8–23)
CO2: 26 mmol/L (ref 22–32)
Calcium: 9.5 mg/dL (ref 8.9–10.3)
Chloride: 105 mmol/L (ref 98–111)
Creatinine, Ser: 0.83 mg/dL (ref 0.44–1.00)
GFR, Estimated: >60 mL/min (ref >60)
Glucose, Bld: 99 mg/dL (ref 70–99)
Potassium: 3.4 mmol/L — ABNORMAL LOW (ref 3.5–5.1)
Sodium: 138 mmol/L (ref 135–145)

## 2022-06-08 LAB — CREATININE, SERUM
Creatinine, Ser: 0.74 mg/dL (ref 0.44–1.00)
GFR, Estimated: >60 mL/min (ref >60)

## 2022-06-08 LAB — I-STAT CHEM 8, ED
BUN: 8 mg/dL (ref 8–23)
Calcium, Ion: 1.29 mmol/L (ref 1.15–1.40)
Chloride: 105 mmol/L (ref 98–111)
Creatinine, Ser: 0.6 mg/dL (ref 0.44–1.00)
Glucose, Bld: 89 mg/dL (ref 70–99)
HCT: 42 % (ref 36.0–46.0)
Hemoglobin: 14.3 g/dL (ref 12.0–15.0)
Potassium: 3.9 mmol/L (ref 3.5–5.1)
Sodium: 141 mmol/L (ref 135–145)
TCO2: 26 mmol/L (ref 22–32)

## 2022-06-08 LAB — CBG MONITORING, ED: Glucose-Capillary: 141 mg/dL — ABNORMAL HIGH (ref 70–99)

## 2022-06-08 LAB — MRSA NEXT GEN BY PCR, NASAL: MRSA by PCR Next Gen: NOT DETECTED

## 2022-06-08 MED ORDER — STROKE: EARLY STAGES OF RECOVERY BOOK
Freq: Once | Status: AC
  Filled 2022-06-08: qty 1

## 2022-06-08 MED ORDER — CHLORHEXIDINE GLUCONATE CLOTH 2 % EX PADS
6.0000 | MEDICATED_PAD | Freq: Every day | CUTANEOUS | Status: DC
  Administered 2022-06-08 – 2022-06-11 (×4): 6 via TOPICAL

## 2022-06-08 MED ORDER — SODIUM CHLORIDE 0.9 % IV SOLN: INTRAVENOUS | Status: DC

## 2022-06-08 MED ORDER — LORAZEPAM 2 MG/ML IJ SOLN: 0.5000 mg | INTRAMUSCULAR | Status: DC | PRN

## 2022-06-08 MED ORDER — ACETAMINOPHEN 325 MG PO TABS: 650.0000 mg | ORAL_TABLET | ORAL | Status: DC | PRN

## 2022-06-08 MED ORDER — ASPIRIN 325 MG PO TABS
325.0000 mg | ORAL_TABLET | Freq: Every day | ORAL | Status: DC
  Administered 2022-06-08: 325 mg via ORAL
  Filled 2022-06-08: qty 1

## 2022-06-08 MED ORDER — PRAVASTATIN SODIUM 10 MG PO TABS
20.0000 mg | ORAL_TABLET | Freq: Every day | ORAL | Status: DC
  Administered 2022-06-08: 20 mg via ORAL
  Filled 2022-06-08: qty 2

## 2022-06-08 MED ORDER — LEVOTHYROXINE SODIUM 88 MCG PO TABS: 88.0000 ug | ORAL_TABLET | Freq: Every day | ORAL | Status: DC

## 2022-06-08 MED ORDER — CARVEDILOL 6.25 MG PO TABS
6.2500 mg | ORAL_TABLET | Freq: Two times a day (BID) | ORAL | Status: DC
  Administered 2022-06-09 – 2022-06-13 (×9): 6.25 mg via ORAL
  Filled 2022-06-08 (×2): qty 1
  Filled 2022-06-08 (×2): qty 2
  Filled 2022-06-08: qty 1
  Filled 2022-06-08: qty 2
  Filled 2022-06-08 (×3): qty 1

## 2022-06-08 MED ORDER — IOHEXOL 350 MG/ML SOLN
75.0000 mL | Freq: Once | INTRAVENOUS | Status: AC | PRN
  Administered 2022-06-08: 75 mL via INTRAVENOUS

## 2022-06-08 MED ORDER — ENOXAPARIN SODIUM 40 MG/0.4ML IJ SOSY
40.0000 mg | PREFILLED_SYRINGE | INTRAMUSCULAR | Status: DC
  Administered 2022-06-08 – 2022-06-12 (×5): 40 mg via SUBCUTANEOUS
  Filled 2022-06-08 (×5): qty 0.4

## 2022-06-08 MED ORDER — ACETAMINOPHEN 160 MG/5ML PO SOLN: 650.0000 mg | ORAL | Status: DC | PRN

## 2022-06-08 MED ORDER — LEVOTHYROXINE SODIUM 88 MCG PO TABS
88.0000 ug | ORAL_TABLET | Freq: Every day | ORAL | Status: DC
  Administered 2022-06-09 – 2022-06-13 (×5): 88 ug via ORAL
  Filled 2022-06-08 (×5): qty 1

## 2022-06-08 MED ORDER — ACETAMINOPHEN 650 MG RE SUPP: 650.0000 mg | RECTAL | Status: DC | PRN

## 2022-06-08 MED ORDER — SENNOSIDES-DOCUSATE SODIUM 8.6-50 MG PO TABS: 1.0000 | ORAL_TABLET | Freq: Every evening | ORAL | Status: DC | PRN

## 2022-06-08 MED ORDER — ASPIRIN 300 MG RE SUPP: 300.0000 mg | Freq: Every day | RECTAL | Status: DC

## 2022-06-08 MED ORDER — CLEVIDIPINE BUTYRATE 0.5 MG/ML IV EMUL
0.0000 mg/h | INTRAVENOUS | Status: DC
  Administered 2022-06-08: 8 mg/h via INTRAVENOUS
  Administered 2022-06-08: 12 mg/h via INTRAVENOUS
  Administered 2022-06-08: 2 mg/h via INTRAVENOUS
  Filled 2022-06-08: qty 100
  Filled 2022-06-08 (×2): qty 50

## 2022-06-08 NOTE — ED Notes (Signed)
Report given to 4N nurse.

## 2022-06-08 NOTE — ED Notes (Signed)
Lab called stated BMP and CBC hemolyzed.

## 2022-06-08 NOTE — ED Provider Notes (Signed)
Tiffany Rice   CSN: [id number] Arrival date & time: 06/08/22  1309     History  Chief Complaint  Patient presents with   Aphasia    Tiffany Rice is a 89 y.o. female.  HPI 89 year old female presents today complaining of difficulty speaking for over 48 hours.  She she denies headache, head injury, blood thinners.  She has no prior history of stroke.  She has not noted any visual changes, chest pain, dyspnea, nausea, vomiting, or diarrhea.    Home Medications Prior to Admission medications   Medication Sig Start Date End Date Taking? Authorizing Provider  Cholecalciferol (VITAMIN D-3 PO) Take 1 capsule by mouth daily.   Yes [provider]  levothyroxine (SYNTHROID, LEVOTHROID) 88 MCG tablet Take 88 mcg by mouth daily.   Yes [provider]  olmesartan (BENICAR) 40 MG tablet Take 40 mg by mouth daily. 02/22/18  Yes [provider]  potassium chloride (K-DUR) 10 MEQ tablet Take 10 mEq by mouth daily.   Yes [provider]  pravastatin (PRAVACHOL) 20 MG tablet Take 20 mg by mouth at bedtime.   Yes [provider]      Allergies    Bactrim [sulfamethoxazole-trimethoprim]    Review of Systems   Review of Systems  Physical Exam Updated Vital Signs BP (!) 199/86   Pulse 83   Temp 98.5 F (36.9 C) (Oral)   Resp (!) 22   Ht 1.651 m ('5\' 5"'$ )   Wt 76.7 kg   SpO2 98%   BMI 28.12 kg/m  Physical Exam Vitals reviewed.  HENT:     Head: Normocephalic.     Right Ear: External ear normal.     Left Ear: External ear normal.     Nose: Nose normal.     Mouth/Throat:     Pharynx: Oropharynx is clear.  Eyes:     Extraocular Movements: Extraocular movements intact.     Pupils: Pupils are equal, round, and reactive to light.     Comments: Some right upper visual field decrease of right eye  Cardiovascular:     Rate and Rhythm: Normal rate and regular rhythm.     Pulses:  Normal pulses.  Pulmonary:     Effort: Pulmonary effort is normal.     Breath sounds: Normal breath sounds.  Abdominal:     Palpations: Abdomen is soft.  Musculoskeletal:        General: Normal range of motion.     Cervical back: Normal range of motion.  Skin:    General: Skin is warm.     Capillary Refill: Capillary refill takes less than 2 seconds.  Neurological:     Mental Status: She is alert.     Comments: Decreased strength right upper extremity No sensory deficits Bilateral legs with 5 out of 5 strength No ataxia noted  Psychiatric:        Mood and Affect: Mood normal.        Behavior: Behavior normal.     ED Results / Procedures / Treatments   Labs (all labs ordered are listed, but only abnormal results are displayed) Labs Reviewed  BASIC METABOLIC PANEL - Abnormal; Notable for Tiffany following components:      Result Value   Potassium 3.4 (*)    All other components within normal limits  CBG MONITORING, ED - Abnormal; Notable for Tiffany following components:   Glucose-Capillary 141 (*)    All other components  within normal limits  CBC  I-STAT CHEM 8, ED    EKG EKG Interpretation  Date/Time:  Tuesday June 08 2022 13:24:01 EST Ventricular Rate:  87 PR Interval:  137 QRS Duration: 87 QT Interval:  372 QTC Calculation: 448 R Axis:   19 Text Interpretation: Sinus rhythm Probable left atrial enlargement RSR' in V1 or V2, right VCD or RVH Confirmed by Pattricia Boss [id number]) on 06/08/2022 3:55:49 PM  Radiology CT Angio Head W or Wo Contrast  Result Date: 06/08/2022 CLINICAL DATA:  cerebral aneurysm. Stroke-like symptoms. Slurred speech. EXAM: CT ANGIOGRAPHY HEAD TECHNIQUE: Multidetector CT imaging of Tiffany head was performed using Tiffany standard protocol during bolus administration of intravenous contrast. Multiplanar CT image reconstructions and MIPs were obtained to evaluate Tiffany vascular anatomy. RADIATION DOSE REDUCTION: This exam was performed according to Tiffany  departmental dose-optimization program which includes automated exposure control, adjustment of the mA and/or kV according to patient size and/or use of iterative reconstruction technique. CONTRAST:  54m OMNIPAQUE IOHEXOL 350 MG/ML SOLN COMPARISON:  Head CT earlier same day. FINDINGS: CTA HEAD Anterior circulation: Both internal carotid arteries are patent through Tiffany skull base and siphon regions. There is atherosclerotic ectasia of Tiffany carotid siphons, right more than left. Aneurysmal dilatation with vascular diameter extending up to 8.4 mm on Tiffany right and 6.7 mm on Tiffany left. No focal berry aneurysm in this region. Tiffany anterior and middle cerebral vessels are patent. No large vessel occlusion or proximal stenosis. Posterior circulation: Both vertebral arteries are patent through Tiffany foramen magnum to Tiffany basilar artery. Tiffany left vertebral artery is dominant. No basilar stenosis. Posterior circulation branch vessels are patent. There is a wide mouth aneurysm at Tiffany basilar tip measuring up to 7 mm in diameter. Mouth of Tiffany aneurysm measures 6 mm. Venous sinuses: Patent and normal. Anatomic variants: None significant. Review of Tiffany MIP images confirms Tiffany above findings. IMPRESSION: 1. No intracranial large vessel occlusion or proximal stenosis. 2. 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 Mouth of Tiffany aneurysm measures 6 mm. 3. Atherosclerotic ectasia of Tiffany carotid siphons, right more than left. Aneurysmal dilatation with vascular diameter extending up to 8.4 mm on Tiffany right and 6.7 mm on Tiffany left. No focal berry aneurysm in this region. Electronically Signed   By: MNelson ChimesM.D.   On: 06/08/2022 15:41   CT Head Wo Contrast  Result Date: 06/08/2022 CLINICAL DATA:  Neuro deficit, acute, stroke suspected EXAM: CT HEAD WITHOUT CONTRAST TECHNIQUE: Contiguous axial images were obtained from Tiffany base of Tiffany skull through Tiffany vertex without intravenous contrast. RADIATION DOSE REDUCTION: This exam was  performed according to Tiffany departmental dose-optimization program which includes automated exposure control, adjustment of the mA and/or kV according to patient size and/or use of iterative reconstruction technique. COMPARISON:  None Available. FINDINGS: Brain: No evidence of acute large vascular territory infarction, hemorrhage, hydrocephalus, extra-axial collection or mass lesion/mass effect. Patchy white matter hypodensities, nonspecific but compatible with chronic microvascular ischemic disease. Remote perforator infarct in Tiffany bilateral basal ganglia. Remote left cerebellar infarct. Vascular: Suspected aneurysm of Tiffany basilar tip. No hyperdense vessel identified. Calcific atherosclerosis. Skull: No acute fracture. Sinuses/Orbits: Clear sinuses.  No acute orbital findings. Other: No mastoid effusions. IMPRESSION: 1. Suspected aneurysm of Tiffany basilar tip. Recommend CTA to confirm and further evaluate. 2. Otherwise, no evidence of acute intracranial abnormality. Chronic microvascular ischemic disease and remote appearing infarcts. MRI could better assess for acute infarct if clinically warranted. Electronically Signed   By:  Margaretha Sheffield M.D.   On: 06/08/2022 15:24    Procedures .Critical Care  Performed by: Pattricia Boss, MD Authorized by: Pattricia Boss, MD   Critical care provider statement:    Critical care time (minutes):  75   Critical care was time spent personally by me on Tiffany following activities:  Development of treatment plan with patient or surrogate, discussions with consultants, evaluation of patient's response to treatment, examination of patient, ordering and review of laboratory studies, ordering and review of radiographic studies, ordering and performing treatments and interventions, pulse oximetry, re-evaluation of patient's condition and review of old charts     Medications Ordered in ED Medications  clevidipine (CLEVIPREX) infusion 0.5 mg/mL (2 mg/hr Intravenous New Bag/Given  06/08/22 1632)  iohexol (OMNIPAQUE) 350 MG/ML injection 75 mL (75 mLs Intravenous Contrast Given 06/08/22 1520)    ED Course/ Medical Decision Making/ A&P                             Medical Decision Making Amount and/or Complexity of Data Reviewed Radiology: ordered.  Risk Prescription drug management.   89 year old female presents today with difficulty speaking, right arm weakness, and visual field deficit. Patient being evaluated here with labs which are pending, EKG which shows a normal sinus rhythm. Head CT obtained and received call with concern for aneurysm and will obtain CT angio. Discussed with Dr. Cheral Marker.  He will see in consultation.  Patient has been hypertensive with systolics [id number] to [id number].  Given patient's aneurysms will start Cleviprex with goal of systolic blood pressure less than 160. Discussed with Dr. Broadus John on for hospitalist and she will see for admission        Final Clinical Impression(s) / ED Diagnoses Final diagnoses:  Neurological symptoms  Aneurysm (arteriovenous) of coronary vessels  Hypertension, unspecified type    Rx / DC Orders ED Discharge Orders     None         Pattricia Boss, MD 06/08/22 1639

## 2022-06-08 NOTE — H&P (Signed)
History and Physical    Tiffany Rice [id number] DOB: 1931-08-09 DOA: 06/08/2022  Referring MD/NP/PA: EDP PCP:  Patient coming from: Home  Chief Complaint: Slurred speech  HPI: Tiffany Rice is a 89/F with history of hypertension, dyslipidemia, hypothyroidism was brought to the ED by her family for slurred speech, patient noticed that her speech was off about 5 days ago, then noticed by her daughter-in-law on the phone on Sunday, today her son was visiting from [place] and noticed that her speech was not normal, subsequently brought to the ED. patient denies any other symptoms, reports being weaker than usual in the last few days but no other focal symptoms. ED Course: Blood pressure 188/72 on arrival, trended up to 220 range, then started on nicardipine gtt. labs unremarkable, CTA head unremarkable except for small aneurysmal dilations  Review of Systems: As per HPI otherwise 14 point review of systems negative.   Past Medical History:  Diagnosis Date   Closed fracture of humerus 08/05/13   pt fell while walking down the street   Complex ovarian cyst 2011   Left side.  Followed by ultrasound.   Depression    Diverticulosis    Esophageal stricture    GERD (gastroesophageal reflux disease)    Hemorrhoids    Hiatal hernia    HTN (hypertension)    Hyperlipidemia    Hypothyroidism    Obesity    Osteopenia 2010    Past Surgical History:  Procedure Laterality Date   CHOLECYSTECTOMY     MOHS SURGERY  1990   TUBAL LIGATION       reports that she has quit smoking. Her smoking use included cigarettes. She has never used smokeless tobacco. She reports that she does not drink alcohol and does not use drugs.  Allergies  Allergen Reactions   Bactrim [Sulfamethoxazole-Trimethoprim] Other (See Comments)    Mood swings     Family History  Problem Relation Age of Onset   Clotting disorder Father    Depression Father    Breast cancer Mother    Colon cancer Son     Colon polyps Daughter    Hyperlipidemia Son        x2    Hypertension Son        x 2   Hyperlipidemia Daughter      Prior to Admission medications   Medication Sig Start Date End Date Taking? Authorizing Provider  Cholecalciferol (VITAMIN D-3 PO) Take 1 capsule by mouth daily.   Yes [provider]  levothyroxine (SYNTHROID, LEVOTHROID) 88 MCG tablet Take 88 mcg by mouth daily.   Yes [provider]  olmesartan (BENICAR) 40 MG tablet Take 40 mg by mouth daily. 02/22/18  Yes [provider]  potassium chloride (K-DUR) 10 MEQ tablet Take 10 mEq by mouth daily.   Yes [provider]  pravastatin (PRAVACHOL) 20 MG tablet Take 20 mg by mouth at bedtime.   Yes [provider]    Physical Exam: Vitals:   06/08/22 1649 06/08/22 1652 06/08/22 1654 06/08/22 1655  BP: (!) 176/90 (!) 145/92 (!) 174/75 (!) 173/75  Pulse: (!) 109 (!) 108 (!) 110 (!) 107  Resp: '20 17 19 '$ (!) 21  Temp:      TempSrc:      SpO2: 97% 97% 96% 93%  Weight:      Height:          Constitutional: NAD, calm, comfortable, AAOx3, no distress, mild dysarthria HEENT:no JVD Respiratory: clear to auscultation bilaterally  Cardiovascular: S1S2/RRR Abdomen: soft, non tender, Bowel sounds positive.  Musculoskeletal: No joint deformity upper and lower extremities. Ext: no edema Skin: no rashes Neurologic: Mild dysarthria, Sensation intact, DTR normal. Strength 5/5 in all 4.  Psychiatric: Normal judgment and insight. Alert and oriented x 3. Normal mood.   Labs on Admission: I have personally reviewed following labs and imaging studies  CBC: Recent Labs  Lab 06/08/22 1536 06/08/22 1559  WBC 5.3  --   HGB 13.7 14.3  HCT 41.8 42.0  MCV 89.5  --   PLT 198  --    Basic Metabolic Panel: Recent Labs  Lab 06/08/22 1536 06/08/22 1559  NA 138 141  K 3.4* 3.9  CL 105 105  CO2 26  --   GLUCOSE 99 89  BUN 8 8  CREATININE 0.83 0.60  CALCIUM 9.5  --    GFR: Estimated  Creatinine Clearance: 49.8 mL/min (by C-G formula based on SCr of 0.6 mg/dL). Liver Function Tests: No results for input(s): "AST", "ALT", "ALKPHOS", "BILITOT", "PROT", "ALBUMIN" in the last 168 hours. No results for input(s): "LIPASE", "AMYLASE" in the last 168 hours. No results for input(s): "AMMONIA" in the last 168 hours. Coagulation Profile: No results for input(s): "INR", "PROTIME" in the last 168 hours. Cardiac Enzymes: No results for input(s): "CKTOTAL", "CKMB", "CKMBINDEX", "TROPONINI" in the last 168 hours. BNP (last 3 results) No results for input(s): "PROBNP" in the last 8760 hours. HbA1C: No results for input(s): "HGBA1C" in the last 72 hours. CBG: Recent Labs  Lab 06/08/22 1322  GLUCAP 141*   Lipid Profile: No results for input(s): "CHOL", "HDL", "LDLCALC", "TRIG", "CHOLHDL", "LDLDIRECT" in the last 72 hours. Thyroid Function Tests: No results for input(s): "TSH", "T4TOTAL", "FREET4", "T3FREE", "THYROIDAB" in the last 72 hours. Anemia Panel: No results for input(s): "VITAMINB12", "FOLATE", "FERRITIN", "TIBC", "IRON", "RETICCTPCT" in the last 72 hours. Urine analysis: No results found for: "COLORURINE", "APPEARANCEUR", "LABSPEC", "PHURINE", "GLUCOSEU", "HGBUR", "BILIRUBINUR", "KETONESUR", "PROTEINUR", "UROBILINOGEN", "NITRITE", "LEUKOCYTESUR" Sepsis Labs: '@LABRCNTIP'$ (procalcitonin:4,lacticidven:4) )No results found for this or any previous visit (from the past 240 hour(s)).   Radiological Exams on Admission: CT Angio Head W or Wo Contrast  Result Date: 06/08/2022 CLINICAL DATA:  cerebral aneurysm. Stroke-like symptoms. Slurred speech. EXAM: CT ANGIOGRAPHY HEAD TECHNIQUE: Multidetector CT imaging of the head was performed using the standard protocol during Rice administration of intravenous contrast. Multiplanar CT image reconstructions and MIPs were obtained to evaluate the vascular anatomy. RADIATION DOSE REDUCTION: This exam was performed according to the departmental  dose-optimization program which includes automated exposure control, adjustment of the mA and/or kV according to patient size and/or use of iterative reconstruction technique. CONTRAST:  44m OMNIPAQUE IOHEXOL 350 MG/ML SOLN COMPARISON:  Head CT earlier same day. FINDINGS: CTA HEAD Anterior circulation: Both internal carotid arteries are patent through the skull base and siphon regions. There is atherosclerotic ectasia of the carotid siphons, right more than left. Aneurysmal dilatation with vascular diameter extending up to 8.4 mm on the right and 6.7 mm on the left. No focal berry aneurysm in this region. The anterior and middle cerebral vessels are patent. No large vessel occlusion or proximal stenosis. Posterior circulation: Both vertebral arteries are patent through the foramen magnum to the basilar artery. The left vertebral artery is dominant. No basilar stenosis. Posterior circulation branch vessels are patent. There is a wide mouth aneurysm at the basilar tip measuring up to 7 mm in diameter. Mouth of the aneurysm measures 6 mm. Venous sinuses: Patent and normal. Anatomic variants:  None significant. Review of the MIP images confirms the above findings. IMPRESSION: 1. No intracranial large vessel occlusion or proximal stenosis. 2. 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 Mouth of the aneurysm measures 6 mm. 3. Atherosclerotic ectasia of the carotid siphons, right more than left. Aneurysmal dilatation with vascular diameter extending up to 8.4 mm on the right and 6.7 mm on the left. No focal berry aneurysm in this region. Electronically Signed   By: Nelson Chimes M.D.   On: 06/08/2022 15:41   CT Head Wo Contrast  Result Date: 06/08/2022 CLINICAL DATA:  Neuro deficit, acute, stroke suspected EXAM: CT HEAD WITHOUT CONTRAST TECHNIQUE: Contiguous axial images were obtained from the base of the skull through the vertex without intravenous contrast. RADIATION DOSE REDUCTION: This exam was performed according  to the departmental dose-optimization program which includes automated exposure control, adjustment of the mA and/or kV according to patient size and/or use of iterative reconstruction technique. COMPARISON:  None Available. FINDINGS: Brain: No evidence of acute large vascular territory infarction, hemorrhage, hydrocephalus, extra-axial collection or mass lesion/mass effect. Patchy white matter hypodensities, nonspecific but compatible with chronic microvascular ischemic disease. Remote perforator infarct in the bilateral basal ganglia. Remote left cerebellar infarct. Vascular: Suspected aneurysm of the basilar tip. No hyperdense vessel identified. Calcific atherosclerosis. Skull: No acute fracture. Sinuses/Orbits: Clear sinuses.  No acute orbital findings. Other: No mastoid effusions. IMPRESSION: 1. Suspected aneurysm of the basilar tip. Recommend CTA to confirm and further evaluate. 2. Otherwise, no evidence of acute intracranial abnormality. Chronic microvascular ischemic disease and remote appearing infarcts. MRI could better assess for acute infarct if clinically warranted. Electronically Signed   By: Margaretha Sheffield M.D.   On: 06/08/2022 15:24    EKG: Independently reviewed.  Normal sinus rhythm, no acute ST-T wave changes  Assessment/Plan    Slurred speech -Likely secondary to subacute CVA versus hypertensive urgency -Admit to progressive unit, currently on nicardipine gtt. -Neurology consulting, obtain MRI brain, CTA head noted some aneurysmal dilations -Check 2D echo, carotid duplex, lipid panel and HbA1c -PT OT, SLP eval    Hypertensive emergency -Currently on nicardipine drip in drip, admit to progressive unit, titrate off drip tomorrow and start calcium channel blocker, add low-dose Coreg today  Dyslipidemia -Continue statin  Hypothyroidism -Continue Synthroid    DVT prophylaxis: Lovenox Code Status: DNR Family Communication: Son at bedside Disposition Plan: Home pending  stroke workup Consults called: Neurology Admission status: Observation  Domenic Polite MD [facility]   06/08/2022, 5:00 PM

## 2022-06-08 NOTE — Progress Notes (Signed)
Patient unable to come to MRI due being on monitored drip.  Patient to go to ICU afterwards.

## 2022-06-08 NOTE — Consult Note (Signed)
Neurology Consultation  Reason for Consult: slurred speech Referring Physician: Dr Domenic Polite, Hospitalist  CC: Slurred speech, left-sided weakness and facial droop  History is obtained from: Patient, son, chart  HPI: Tiffany Rice is a 89 y.o. female past medical history of hypertension, hyperlipidemia, depression, presented to the emergency room for evaluation of slurred speech and weakness with last known well which is unclear but symptoms of slurred speech was noticed on the phone by daughter-in-law on Sunday.  Son came to visit today from [place] and noticed that speech was not normal and brought her in for further evaluation.  He also felt that his left angle of the mouth was drooping and she was drooling some liquid while trying to drink from that side.  She reports generalized weakness over the past few days.  She reports having had a fall in her yard-lives in a townhome, and required neighbors to help her to get up.  She reports that she missed a step and did not realize that her foot had caught in the step. No prior history of strokes.  Lives by herself.  Takes care of ADLs and expenses herself.  Has 3 children, all of whom live out of town.  LKW: Multiple days ago-unclear last known well IV thrombolysis given?: no, outside the window EVT: No-outside the Premorbid modified Rankin scale (mRS): 0   ROS: Full ROS was performed and is negative except as noted in the HPI.   Past Medical History:  Diagnosis Date   Closed fracture of humerus 08/05/13   pt fell while walking down the street   Complex ovarian cyst 2011   Left side.  Followed by ultrasound.   Depression    Diverticulosis    Esophageal stricture    GERD (gastroesophageal reflux disease)    Hemorrhoids    Hiatal hernia    HTN (hypertension)    Hyperlipidemia    Hypothyroidism    Obesity    Osteopenia 2010     Family History  Problem Relation Age of Onset   Clotting disorder Father    Depression  Father    Breast cancer Mother    Colon cancer Son    Colon polyps Daughter    Hyperlipidemia Son        x2    Hypertension Son        x 2   Hyperlipidemia Daughter      Social History:   reports that she has quit smoking. Her smoking use included cigarettes. She has never used smokeless tobacco. She reports that she does not drink alcohol and does not use drugs.  Medications  Current Facility-Administered Medications:    [START ON 06/09/2022]  stroke: early stages of recovery book, , Does not apply, Once, Domenic Polite, MD   0.9 %  sodium chloride infusion, , Intravenous, Continuous, Domenic Polite, MD   acetaminophen (TYLENOL) tablet 650 mg, 650 mg, Oral, Q4H PRN **OR** acetaminophen (TYLENOL) 160 MG/5ML solution 650 mg, 650 mg, Per Tube, Q4H PRN **OR** acetaminophen (TYLENOL) suppository 650 mg, 650 mg, Rectal, Q4H PRN, Domenic Polite, MD   aspirin suppository 300 mg, 300 mg, Rectal, Daily **OR** aspirin tablet 325 mg, 325 mg, Oral, Daily, Domenic Polite, MD   [START ON 06/09/2022] carvedilol (COREG) tablet 6.25 mg, 6.25 mg, Oral, BID WC, Domenic Polite, MD   Chlorhexidine Gluconate Cloth 2 % PADS 6 each, 6 each, Topical, Daily, Domenic Polite, MD   clevidipine (CLEVIPREX) infusion 0.5 mg/mL, 0-21 mg/hr, Intravenous, Continuous, Domenic Polite, MD,  Last Rate: 20 mL/hr at 06/08/22 1711, 10 mg/hr at 06/08/22 1711   enoxaparin (LOVENOX) injection 40 mg, 40 mg, Subcutaneous, Q24H, Domenic Polite, MD   Derrill Memo ON 06/09/2022] levothyroxine (SYNTHROID) tablet 88 mcg, 88 mcg, Oral, Q0600, Domenic Polite, MD   LORazepam (ATIVAN) injection 0.5 mg, 0.5 mg, Intravenous, PRN, Domenic Polite, MD   pravastatin (PRAVACHOL) tablet 20 mg, 20 mg, Oral, QHS, Domenic Polite, MD   senna-docusate (Senokot-S) tablet 1 tablet, 1 tablet, Oral, QHS PRN, Domenic Polite, MD  Exam: Current vital signs: BP (!) 149/70   Pulse 91   Temp 98.3 F (36.8 C) (Oral)   Resp 12   Ht '5\' 5"'$  (1.651 m)   Wt 76.7  kg   SpO2 94%   BMI 28.12 kg/m  Vital signs in last 24 hours: Temp:  [98.3 F (36.8 C)-98.5 F (36.9 C)] 98.3 F (36.8 C) (03/05 1710) Pulse Rate:  [74-110] 91 (03/05 1900) Resp:  [12-22] 12 (03/05 1900) BP: (136-245)/(67-212) 149/70 (03/05 1900) SpO2:  [93 %-100 %] 94 % (03/05 1900) Weight:  [76.7 kg] 76.7 kg (03/05 1320) General: Awake alert in no distress HEENT: Normocephalic atraumatic Lungs: Clear Cardiovascular: Regular rhythm Abdomen nondistended nontender Extremities warm well-perfused Neurological exam Awake alert oriented x 3 Speech is dysarthric No aphasia Cranial nerves: Pupils equal round react light, extraocular movements intact, visual fields full, face appears mildly asymmetric with left lower facial droop and left lid lag and mild weakness and closing the left leg compared to the right.  She is able to wrinkle her forehead symmetrically.  Facial sensation intact.  Hearing reduced bilaterally.  Tongue and palate midline. Motor examination with no drift in any of the 4 extremities Sensation intact Coordination with no dysmetria in the upper or lower extremities NIH stroke scale-2  Labs I have reviewed labs in epic and the results pertinent to this consultation are:  CBC    Component Value Date/Time   WBC 5.3 06/08/2022 1536   RBC 4.67 06/08/2022 1536   HGB 14.3 06/08/2022 1559   HCT 42.0 06/08/2022 1559   PLT 198 06/08/2022 1536   MCV 89.5 06/08/2022 1536   MCH 29.3 06/08/2022 1536   MCHC 32.8 06/08/2022 1536   RDW 13.2 06/08/2022 1536    CMP     Component Value Date/Time   NA 141 06/08/2022 1559   K 3.9 06/08/2022 1559   CL 105 06/08/2022 1559   CO2 26 06/08/2022 1536   GLUCOSE 89 06/08/2022 1559   BUN 8 06/08/2022 1559   CREATININE 0.60 06/08/2022 1559   CALCIUM 9.5 06/08/2022 1536   GFRNONAA >60 06/08/2022 1536    Imaging I have reviewed the images obtained:  CT-head: No acute changes.  Concern for basilar aneurysm.  No bleed. CT  angiography of the head confirms the tip of the basilar aneurysm measuring 7 mm.  No emergent LVO.  Assessment:  89 year old with multiple cerebrovascular risk factors presenting for multiple days of slurred speech and generalized weakness along with left facial weakness. Concern for small stroke Imaging-CT head and CT angio head and neck with a basilar tip aneurysm.  No evidence of bleeding. Came in hypertensive and was recommended by the day neurologist to maintain blood pressures below 160. At this point, she does not really need permissive hypertension since last known well is 4 to 5 days ago and I agree that her blood pressure should be normalized gradually to normotension. Agree with admission for workup  Impression: -Evaluate for stroke-etiology under investigation -  Basilar artery aneurysm-unruptured  Recommendations: Admit to hospitalist Frequent neurochecks Telemetry Aspirin 81+ Plavix 75 High intensity statin MRI brain without contrast 2D echo A1c EpiPen PT OT Speech therapy Cleared swallow screen what I was examining the patient-okay for heart healthy carb modified diet. Stroke team to follow Plan discussed with the patient and son at bedside. Admitting hospitalist off service at the time of dictation-note forwarded to their inbox.  -- Amie Portland, MD Neurologist [facility] Pager: [phone]

## 2022-06-08 NOTE — ED Notes (Signed)
Patient transported to CT 

## 2022-06-08 NOTE — ED Triage Notes (Signed)
Patient BIB EMS from home due to stroke like symptoms. EMS reports family talked to patient on Sunday and noticed slurred speech, delayed speech, and confusion. EMS reports minor left sided facial droop starting today. EMS reports family tried to feed patient ice cream and it would fall out of mouth on left side. Today patient complains of fatigue and headache rating pain 7/10. Patient endorses slurred speech at this time.

## 2022-06-08 NOTE — Progress Notes (Signed)
Patient refusing MRI at this time due to "very bad claustrophobia", Dr. Rory Percy aware will hold off on MRI tonight.

## 2022-06-08 NOTE — ED Notes (Signed)
This RN called 4N. Was told to wait till 1915 to call report.

## 2022-06-08 NOTE — ED Notes (Signed)
This RN assisted patient to bedpan.

## 2022-06-09 ENCOUNTER — Inpatient Hospital Stay (HOSPITAL_COMMUNITY): Payer: Medicare Other

## 2022-06-09 ENCOUNTER — Encounter (HOSPITAL_COMMUNITY): Payer: Self-pay | Admitting: Internal Medicine

## 2022-06-09 DIAGNOSIS — I639 Cerebral infarction, unspecified: Secondary | ICD-10-CM

## 2022-06-09 DIAGNOSIS — I63311 Cerebral infarction due to thrombosis of right middle cerebral artery: Secondary | ICD-10-CM

## 2022-06-09 DIAGNOSIS — I509 Heart failure, unspecified: Secondary | ICD-10-CM

## 2022-06-09 DIAGNOSIS — I2541 Coronary artery aneurysm: Secondary | ICD-10-CM

## 2022-06-09 DIAGNOSIS — R4781 Slurred speech: Secondary | ICD-10-CM | POA: Diagnosis not present

## 2022-06-09 LAB — LIPID PANEL
Cholesterol: 206 mg/dL — ABNORMAL HIGH (ref 0–200)
HDL: 48 mg/dL (ref >40)
LDL Cholesterol: 141 mg/dL — ABNORMAL HIGH (ref 0–99)
Total CHOL/HDL Ratio: 4.3 RATIO
Triglycerides: 84 mg/dL (ref <150)
VLDL: 17 mg/dL (ref 0–40)

## 2022-06-09 LAB — ECHOCARDIOGRAM COMPLETE
Area-P 1/2: 2.66 cm2
Calc EF: 73.2 %
Height: 65 in
S' Lateral: 2.3 cm
Single Plane A2C EF: 78.3 %
Single Plane A4C EF: 67.7 %
Weight: 2704 oz

## 2022-06-09 MED ORDER — CLOPIDOGREL BISULFATE 75 MG PO TABS
75.0000 mg | ORAL_TABLET | Freq: Every day | ORAL | Status: DC
  Administered 2022-06-09 – 2022-06-13 (×5): 75 mg via ORAL
  Filled 2022-06-09 (×5): qty 1

## 2022-06-09 MED ORDER — ROSUVASTATIN CALCIUM 20 MG PO TABS
20.0000 mg | ORAL_TABLET | Freq: Every day | ORAL | Status: DC
  Administered 2022-06-09 – 2022-06-13 (×5): 20 mg via ORAL
  Filled 2022-06-09 (×5): qty 1

## 2022-06-09 MED ORDER — LORAZEPAM 2 MG/ML IJ SOLN
1.0000 mg | INTRAMUSCULAR | Status: DC | PRN
  Administered 2022-06-09: 1 mg via INTRAVENOUS
  Filled 2022-06-09 (×2): qty 1

## 2022-06-09 MED ORDER — ASPIRIN 81 MG PO TBEC
81.0000 mg | DELAYED_RELEASE_TABLET | Freq: Every day | ORAL | Status: DC
  Administered 2022-06-09 – 2022-06-13 (×5): 81 mg via ORAL
  Filled 2022-06-09 (×5): qty 1

## 2022-06-09 NOTE — Progress Notes (Signed)
Carotid duplex bilateral study completed.   Please see CV Proc for preliminary results.   Supriya Beaston, RDMS, RVT  

## 2022-06-09 NOTE — Progress Notes (Addendum)
PROGRESS NOTE    Tiffany Rice  [id number] DOB: 1931/09/06 DOA: 06/08/2022 PCP: Haywood Pao, MD  89/F with history of hypertension, dyslipidemia, hypothyroidism was brought to the ED by her family for slurred speech, patient noticed that her speech was off about 5 days ago, then noticed by her daughter-in-law on the phone on Sunday, today her son was visiting from [place] and noticed that her speech was not normal, subsequently brought to the ED. patient denies any other symptoms, reports being weaker than usual in the last few days but no other focal symptoms. ED Course: Blood pressure 188/72 on arrival, trended up to 220 range, then started on nicardipine gtt. labs unremarkable, CTA head unremarkable except for small aneurysmal dilations  Subjective: -Feels okay, no events overnight, speech not back to normal yet  Assessment and Plan:   Slurred speech -Likely secondary to subacute CVA versus hypertensive urgency -Blood pressures have normalized we will discontinue nicardipine gtt. -Neurology consulting, recommended aspirin and Plavix -Due to have MRI brain today, CTA head noted aneurysmal dilations -FU 2D echo, carotid duplex, -LDL is 141, continue statin, FU HbA1c -PT OT, SLP eval     Hypertensive emergency -Required nicardipine gtt. on admission yesterday, will discontinue today  -Started on low-dose Coreg, BP in normal range now -Require permissive hypertension as onset of symptoms were > 5 days ago  dyslipidemia -Continue statin   Hypothyroidism -Continue Synthroid     DVT prophylaxis: Lovenox Code Status: DNR Family Communication: Son at bedside yesterday Disposition Plan: Home pending stroke workup  Consultants: neuro   Procedures:   Antimicrobials:    Objective: Vitals:   06/09/22 0900 06/09/22 0945 06/09/22 1016 06/09/22 1100  BP:  129/75 134/76 127/85  Pulse: 90 70 (!) 197 73  Resp: (!) 25 (!) 27    Temp:      TempSrc:      SpO2: 94%  (!) 87% 100% 94%  Weight:      Height:        Intake/Output Summary (Last 24 hours) at 06/09/2022 1132 Last data filed at 06/09/2022 0900 Gross per 24 hour  Intake 161.8 ml  Output 525 ml  Net -363.2 ml   Filed Weights   06/08/22 1320  Weight: 76.7 kg    Examination:  General exam: Appears calm and comfortable  Respiratory system: Clear to auscultation Cardiovascular system: S1 & S2 heard, RRR.  Abd: nondistended, soft and nontender.Normal bowel sounds heard. Central nervous system: Alert and oriented.  Mild dysarthria Extremities: no edema Skin: No rashes Psychiatry:  Mood & affect appropriate.     Data Reviewed:   CBC: Recent Labs  Lab 06/08/22 1536 06/08/22 1559 06/08/22 1957  WBC 5.3  --  5.1  HGB 13.7 14.3 16.0*  HCT 41.8 42.0 45.7  MCV 89.5  --  86.1  PLT 198  --  [id number]   Basic Metabolic Panel: Recent Labs  Lab 06/08/22 1536 06/08/22 1559 06/08/22 1957  NA 138 141  --   K 3.4* 3.9  --   CL 105 105  --   CO2 26  --   --   GLUCOSE 99 89  --   BUN 8 8  --   CREATININE 0.83 0.60 0.74  CALCIUM 9.5  --   --    GFR: Estimated Creatinine Clearance: 49.8 mL/min (by C-G formula based on SCr of 0.74 mg/dL). Liver Function Tests: No results for input(s): "AST", "ALT", "ALKPHOS", "BILITOT", "PROT", "ALBUMIN" in the last 168 hours. No  results for input(s): "LIPASE", "AMYLASE" in the last 168 hours. No results for input(s): "AMMONIA" in the last 168 hours. Coagulation Profile: No results for input(s): "INR", "PROTIME" in the last 168 hours. Cardiac Enzymes: No results for input(s): "CKTOTAL", "CKMB", "CKMBINDEX", "TROPONINI" in the last 168 hours. BNP (last 3 results) No results for input(s): "PROBNP" in the last 8760 hours. HbA1C: No results for input(s): "HGBA1C" in the last 72 hours. CBG: Recent Labs  Lab 06/08/22 1322  GLUCAP 141*   Lipid Profile: Recent Labs    06/09/22 0442  CHOL 206*  HDL 48  LDLCALC 141*  TRIG 84  CHOLHDL 4.3   Thyroid  Function Tests: No results for input(s): "TSH", "T4TOTAL", "FREET4", "T3FREE", "THYROIDAB" in the last 72 hours. Anemia Panel: No results for input(s): "VITAMINB12", "FOLATE", "FERRITIN", "TIBC", "IRON", "RETICCTPCT" in the last 72 hours. Urine analysis: No results found for: "COLORURINE", "APPEARANCEUR", "LABSPEC", "PHURINE", "GLUCOSEU", "HGBUR", "BILIRUBINUR", "KETONESUR", "PROTEINUR", "UROBILINOGEN", "NITRITE", "LEUKOCYTESUR" Sepsis Labs: '@LABRCNTIP'$ (procalcitonin:4,lacticidven:4)  ) Recent Results (from the past 240 hour(s))  MRSA Next Gen by PCR, Nasal     Status: None   Collection Time: 06/08/22  7:43 PM   Specimen: Nasal Mucosa; Nasal Swab  Result Value Ref Range Status   MRSA by PCR Next Gen NOT DETECTED NOT DETECTED Final    Comment: (NOTE) The GeneXpert MRSA Assay (FDA approved for NASAL specimens only), is one component of a comprehensive MRSA colonization surveillance program. It is not intended to diagnose MRSA infection nor to guide or monitor treatment for MRSA infections. Test performance is not FDA approved in patients less than 63 years old. Performed at [facility], [place] [address], [place], [place] [address]        Scheduled Meds:  aspirin EC  81 mg Oral Daily   carvedilol  6.25 mg Oral BID WC   Chlorhexidine Gluconate Cloth  6 each Topical Daily   clopidogrel  75 mg Oral Daily   enoxaparin (LOVENOX) injection  40 mg Subcutaneous Q24H   levothyroxine  88 mcg Oral Q0600   rosuvastatin  20 mg Oral Daily   Continuous Infusions:  sodium chloride       LOS: 1 day    Time spent: 24mn    PDomenic Polite MD [facility]   06/09/2022, 11:32 AM

## 2022-06-09 NOTE — Progress Notes (Signed)
OT Cancellation Note  Patient Details Name: Tiffany Rice MRN: [id number] DOB: 1932-02-18   Cancelled Treatment:    Reason Eval/Treat Not Completed: Patient at procedure or test/ unavailable (Pt received ativan and then was transported to MRI. Will re-attempt OT evaluation later today if pt is able.)  Ailene Ravel, OTR/L,CBIS  Supplemental OT - [facility] and [facility] Secure Chat Preferred   06/09/2022, 10:50 AM

## 2022-06-09 NOTE — Evaluation (Signed)
Speech Language Pathology Evaluation Patient Details Name: Tiffany Rice MRN: [id number] DOB: 1931/06/25 Today's Date: 06/09/2022 Time: [id number] SLP Time Calculation (min) (ACUTE ONLY): 42 min  Problem List:  Patient Active Problem List   Diagnosis Date Noted   Slurred speech 06/08/2022   Hypertensive emergency 06/08/2022   CVA (cerebral vascular accident) ([place]) 06/08/2022   Cyst of left ovary 03/04/2017   Essential hypertension 02/15/2013   Hyperlipidemia 02/15/2013   Hypothyroidism 02/15/2013   CKD (chronic kidney disease) stage 2, GFR 60-89 ml/min 02/15/2013   Chest pain on exertion 02/15/2013   Past Medical History:  Past Medical History:  Diagnosis Date   Closed fracture of humerus 08/05/13   pt fell while walking down the street   Complex ovarian cyst 2011   Left side.  Followed by ultrasound.   Depression    Diverticulosis    Esophageal stricture    GERD (gastroesophageal reflux disease)    Hemorrhoids    Hiatal hernia    HTN (hypertension)    Hyperlipidemia    Hypothyroidism    Obesity    Osteopenia 2010   Past Surgical History:  Past Surgical History:  Procedure Laterality Date   CHOLECYSTECTOMY     MOHS SURGERY  1990   TUBAL LIGATION     HPI:  Pt is an 89 y/o female who presented with slurred speech which started 5 days prior to admission. CTA Head: 2. 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 Mouth of the aneurysm measures 6 mm. PMH: hypertension, dyslipidemia, hypothyroidism.   Assessment / Plan / Recommendation Clinical Impression  Pt participated in speech-language-cognition evaluation with her son present. Pt reported that she completed high school and lived alone prior to admission. [name] reported that the pt has some difficulty with memory but that the pt's cognition is worse than at baseline. Both parties stated that the pt's speech is now "slurred" and agreed that it is now approximately 50% back to baseline. [facility]  Mental Status Examination was completed to evaluate the pt's cognitive-linguistic skills. She achieved a score of 13/30 which is below the normal limits of 27 or more out of 30. She exhibited difficulty in the areas of sustained attention, memory, and executive function as it relates to problem solving, mental flexibility, and awareness. She also presented with moderate-severe dysarthria characterized by reduced articulatory precision and a reduced ability to adequately coordinate respiration with speech; these negatively impacted speech intelligibility at the sentence and conversational levels. Skilled SLP services are clinically indicated at this time to improve motor speech and cognitive-linguistic function.    SLP Assessment  SLP Recommendation/Assessment: Patient needs continued Speech Lanaguage Pathology Services SLP Visit Diagnosis: Cognitive communication deficit (R41.841);Dysarthria and anarthria (R47.1)    Recommendations for follow up therapy are one component of a multi-disciplinary discharge planning process, led by the attending physician.  Recommendations may be updated based on patient status, additional functional criteria and insurance authorization.    Follow Up Recommendations  Home health SLP    Assistance Recommended at Discharge  Intermittent Supervision/Assistance  Functional Status Assessment Patient has had a recent decline in their functional status and demonstrates the ability to make significant improvements in function in a reasonable and predictable amount of time.  Frequency and Duration min 2x/week  2 weeks      SLP Evaluation Cognition  Overall Cognitive Status: Impaired/Different from baseline Arousal/Alertness: Awake/alert Orientation Level: Oriented X4 Year: 2024 Month: March Day of Week: Correct Attention: Focused;Sustained Focused Attention:  Appears intact Sustained Attention: Impaired Sustained Attention Impairment: Verbal complex Memory:  Impaired Memory Impairment:  (Immediate: 5/5 with repetition x3; delayed" 1/5; with cues: 1/4) Awareness: Impaired Awareness Impairment: Emergent impairment Problem Solving: Impaired Problem Solving Impairment: Verbal complex Executive Function: Sequencing;Organizing Sequencing: Impaired Sequencing Impairment: Verbal complex (clock: 2/4) Organizing: Impaired Organizing Impairment: Verbal complex (backward digit span: 0/2)       Comprehension  Auditory Comprehension Overall Auditory Comprehension: Appears within functional limits for tasks assessed Yes/No Questions: Within Functional Limits Commands: Within Functional Limits Conversation: Complex    Expression Expression Primary Mode of Expression: Verbal Verbal Expression Overall Verbal Expression: Appears within functional limits for tasks assessed Initiation: No impairment Level of Generative/Spontaneous Verbalization: Conversation Repetition: No impairment Naming: No impairment Pragmatics: No impairment Written Expression Dominant Hand: Right   Oral / Motor  Oral Motor/Sensory Function Overall Oral Motor/Sensory Function: Mild impairment Facial ROM: Reduced left;Suspected CN VII (facial) dysfunction Facial Symmetry: Abnormal symmetry left;Suspected CN VII (facial) dysfunction Facial Strength: Reduced left;Suspected CN VII (facial) dysfunction Lingual ROM: Reduced left;Reduced right;Suspected CN XII (hypoglossal) dysfunction Lingual Strength: Reduced;Suspected CN XII (hypoglossal) dysfunction Motor Speech Overall Motor Speech: Impaired Respiration: Impaired Level of Impairment: Conversation Resonance: Within functional limits Articulation: Impaired Level of Impairment: Sentence Intelligibility: Intelligibility reduced Word: 50-74% accurate Phrase: 50-74% accurate Sentence: 50-74% accurate Conversation: 50-74% accurate           Sophiah Rolin I. Hardin Negus, [place], [place] Office number  [phone]  Horton Marshall 06/09/2022, 1:49 PM

## 2022-06-09 NOTE — Evaluation (Signed)
Physical Therapy Evaluation Patient Details Name: Tiffany Rice MRN: [id number] DOB: [birth year] Today's Date: 06/09/2022  History of Present Illness  Pt is an 89yo female who was brough to ED with generalized weakness and impaired speech. 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 PMH: HTN, dyslipidemia, & hypothyroidism   Clinical Impression  Pt admitted with above. Pt was indep, living alone and driving PTA. Pt now presenting with impaired balance and is at increased fall risk requiring use of AD and physical assist for safe ambulation and transfers. Pt continues with expressive difficulty/slurred speech as well. Pt unsafe to return home alone at this time but states her son is coming in from [place] today and can stay as long as needed. Pt agrees her balance is off and would benefit from use of RW and HHPT. Acute PT to cont to follow.     Recommendations for follow up therapy are one component of a multi-disciplinary discharge planning process, led by the attending physician.  Recommendations may be updated based on patient status, additional functional criteria and insurance authorization.  Follow Up Recommendations Home health PT      Assistance Recommended at Discharge Frequent or constant Supervision/Assistance  Patient can return home with the following  A little help with walking and/or transfers;A little help with bathing/dressing/bathroom;Assist for transportation;Help with stairs or ramp for entrance    Equipment Recommendations Rolling walker (2 wheels)  Recommendations for Other Services       Functional Status Assessment Patient has had a recent decline in their functional status and demonstrates the ability to make significant improvements in function in a reasonable and predictable amount of time.     Precautions / Restrictions Precautions Precautions: Fall Restrictions Weight Bearing Restrictions: No      Mobility  Bed Mobility Overal bed mobility: Needs  Assistance Bed Mobility: Supine to Sit     Supine to sit: Supervision     General bed mobility comments: HOB flat, increased time, no physical assist, increased effort by patient to achieve full upright sitting    Transfers Overall transfer level: Needs assistance Equipment used: 1 person hand held assist Transfers: Sit to/from Stand Sit to Stand: Min assist           General transfer comment: minA to power up, pt with wide base of support, minA to stedy upon standing    Ambulation/Gait Ambulation/Gait assistance: Min assist, Mod assist Gait Distance (Feet): 120 Feet Assistive device: 1 person hand held assist Gait Pattern/deviations: Step-to pattern, Decreased stride length, Staggering left, Staggering right, Trunk flexed, Wide base of support Gait velocity: dec Gait velocity interpretation: <1.31 ft/sec, indicative of household ambulator   General Gait Details: attempted to amb without AD, PT offered hand on R side and pt with increased stability. When pt asked to try to amb again without UE support pt said "No i'm not letting go of your hand." Discussed using a RW until her balance improves  Stairs            Wheelchair Mobility    Modified Rankin (Stroke Patients Only)       Balance Overall balance assessment: Needs assistance Sitting-balance support: Feet supported, No upper extremity supported Sitting balance-Leahy Scale: Good Sitting balance - Comments: able to don socks at EOB   Standing balance support: Single extremity supported Standing balance-Leahy Scale: Poor Standing balance comment: reliant on external support, pt leaned against sink to wash hands  Pertinent Vitals/Pain Pain Assessment Pain Assessment: No/denies pain    Home Living Family/patient expects to be discharged to:: Private residence Living Arrangements: Alone Available Help at Discharge: Family;Friend(s);Available PRN/intermittently (pt  reports son from [place] coming in today and can stay for some time if needed) Type of Home: Other(Comment) (town home) Home Access: Stairs to enter Entrance Stairs-Rails: None Technical brewer of Steps: 1 Alternate Level Stairs-Number of Steps: flight Home Layout: Two level Home Equipment: None      Prior Function Prior Level of Function : Independent/Modified Independent             Mobility Comments: reports one fall last month and was thinking about getting a cane. does drive and do grocery shopping/errands ADLs Comments: indep     Hand Dominance   Dominant Hand: Right    Extremity/Trunk Assessment   Upper Extremity Assessment Upper Extremity Assessment: Generalized weakness    Lower Extremity Assessment Lower Extremity Assessment: Generalized weakness    Cervical / Trunk Assessment Cervical / Trunk Assessment: Normal  Communication   Communication: Expressive difficulties  Cognition Arousal/Alertness: Awake/alert Behavior During Therapy: Impulsive Overall Cognitive Status: No family/caregiver present to determine baseline cognitive functioning                                 General Comments: pt A&Ox4. Pt impulsive with decreased insight to safety. Pt aware her balance is off and request to hold onto PTs hand however also states "I just need to go home." Pt slightly agitated due to being hungry as she hasn't eaten in 2 days        General Comments General comments (skin integrity, edema, etc.): Pt assisted to commode, supervision for pericare. Pt min guard while pt washed hands at sink, pt supported self via leaning against sink to wash her hands    Exercises     Assessment/Plan    PT Assessment Patient needs continued PT services  PT Problem List Decreased strength;Decreased range of motion;Decreased activity tolerance;Decreased balance;Decreased mobility;Decreased knowledge of use of DME;Decreased safety awareness       PT  Treatment Interventions DME instruction;Gait training;Stair training;Functional mobility training;Therapeutic activities;Therapeutic exercise;Balance training    PT Goals (Current goals can be found in the Care Plan section)  Acute Rehab PT Goals Patient Stated Goal: home PT Goal Formulation: With patient Time For Goal Achievement: 06/23/22 Potential to Achieve Goals: Good    Frequency Min 4X/week     Co-evaluation               AM-PAC PT "6 Clicks" Mobility  Outcome Measure Help needed turning from your back to your side while in a flat bed without using bedrails?: A Little Help needed moving from lying on your back to sitting on the side of a flat bed without using bedrails?: A Little Help needed moving to and from a bed to a chair (including a wheelchair)?: A Little Help needed standing up from a chair using your arms (e.g., wheelchair or bedside chair)?: A Little Help needed to walk in hospital room?: A Lot Help needed climbing 3-5 steps with a railing? : A Lot 6 Click Score: 16    End of Session Equipment Utilized During Treatment: Gait belt Activity Tolerance: Patient limited by fatigue Patient left: in bed;with call bell/phone within reach;with bed alarm set;with nursing/sitter in room Nurse Communication: Mobility status PT Visit Diagnosis: Unsteadiness on feet (R26.81);Muscle weakness (generalized) (M62.81);Difficulty in walking,  not elsewhere classified (R26.2)    Time: [id number] PT Time Calculation (min) (ACUTE ONLY): 24 min   Charges:   PT Evaluation $PT Eval Moderate Complexity: 1 Mod PT Treatments $Gait Training: 8-22 mins        Kittie Plater, PT, DPT Acute Rehabilitation Services Secure chat preferred Office #: [phone]   Berline Lopes 06/09/2022, 10:53 AM

## 2022-06-09 NOTE — Progress Notes (Signed)
OT Cancellation Note  Patient Details Name: Tiffany Rice MRN: [id number] DOB: 18-May-1931   Cancelled Treatment:    Reason Eval/Treat Not Completed: Patient at procedure or test/ unavailable (Pt currently receiving Echo. Nursing reports that pt is still drowsy from Ativan. Will attempt eval on 3/7.)  Ailene Ravel, OTR/L,CBIS  Supplemental OT - [facility] and [facility] Secure Chat Preferred   06/09/2022, 3:01 PM

## 2022-06-09 NOTE — Progress Notes (Signed)
  Echocardiogram 2D Echocardiogram has been performed.  Tiffany Rice 06/09/2022, 2:53 PM

## 2022-06-09 NOTE — Progress Notes (Addendum)
STROKE TEAM PROGRESS NOTE   INTERVAL HISTORY Patient is seen in her room with her son at the bedside.  She presented yesterday with dysarthria and left-sided facial droop.  Her last known well time was unclear, she was she was not given TNK, and she was also noted to be outside the window for mechanical thrombectomy.  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  MRI reveals acute infarct in right putamen and corona radiata.  Vitals:   06/09/22 1200 06/09/22 1300 06/09/22 1400 06/09/22 1500  BP: 128/79 136/89 (!) 151/97 (!) 164/86  Pulse: 77 75 65 68  Resp: 15 (!) 25 (!) 25 (!) 23  Temp: 97.7 F (36.5 C)     TempSrc: Axillary     SpO2: 95% 94% 93% 94%  Weight:      Height:       CBC:  Recent Labs  Lab 06/08/22 1536 06/08/22 1559 06/08/22 1957  WBC 5.3  --  5.1  HGB 13.7 14.3 16.0*  HCT 41.8 42.0 45.7  MCV 89.5  --  86.1  PLT 198  --  [id number]   Basic Metabolic Panel:  Recent Labs  Lab 06/08/22 1536 06/08/22 1559 06/08/22 1957  NA 138 141  --   K 3.4* 3.9  --   CL 105 105  --   CO2 26  --   --   GLUCOSE 99 89  --   BUN 8 8  --   CREATININE 0.83 0.60 0.74  CALCIUM 9.5  --   --    Lipid Panel:  Recent Labs  Lab 06/09/22 0442  CHOL 206*  TRIG 84  HDL 48  CHOLHDL 4.3  VLDL 17  LDLCALC 141*   HgbA1c: No results for input(s): "HGBA1C" in the last 168 hours. Urine Drug Screen: No results for input(s): "LABOPIA", "COCAINSCRNUR", "LABBENZ", "AMPHETMU", "THCU", "LABBARB" in the last 168 hours.  Alcohol Level No results for input(s): "ETH" in the last 168 hours.  IMAGING past 24 hours MR BRAIN WO CONTRAST  Result Date: 06/09/2022 CLINICAL DATA:  Stroke, follow-up.  Slurred speech. EXAM: MRI HEAD WITHOUT CONTRAST TECHNIQUE: Multiplanar, multiecho pulse sequences of the brain and surrounding structures were obtained without intravenous contrast. COMPARISON:  CTA head 06/08/2022.  Head CT 06/08/2022. FINDINGS: Brain: Late acute infarct in the right posterior  putamen and corona radiata. No acute intracranial hemorrhage. No mass or midline shift. Unchanged moderate chronic small-vessel disease and old lacunar infarct in the left basal ganglia. Chronic microhemorrhage in the right globus pallidus. No hydrocephalus or extra-axial collection. Vascular: Redemonstrated ectasia of the right supraclinoid ICA and basilar tip aneurysm, better evaluated on previous day's CTA. Skull and upper cervical spine: Normal marrow signal. Sinuses/Orbits: Unremarkable. Other: None. IMPRESSION: 1. Late acute infarct in the right posterior putamen and corona radiata. 2. 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 Electronically Signed   By: Emmit Alexanders M.D.   On: 06/09/2022 10:22    PHYSICAL EXAM General: Alert, well-nourished, well-developed elderly patient in no acute distress Respiratory: Regular, unlabored respirations on room air  NEURO:  Mental Status: AA&Ox3  Speech/Language: speech is with slight dysarthria but no aphasia  Cranial Nerves:  II: PERRL. Visual fields full. III, IV, VI: EOMI. Eyelids elevate symmetrically.  V: Sensation is intact to light touch and symmetrical to face.  VII: Slight left facial droop VIII: hearing intact to voice. IX, X: Voice is dysarthric [id number]  shrug 5/5. XII: tongue is midline without fasciculations. Motor: 5/5 strength to right upper extremity and bilateral lower extremities, 4+ out of 5 strength to left upper extremity, diminished fine finger movements on the left Tone: is normal and bulk is normal Sensation- Intact to light touch bilaterally.  Coordination: FTN intact bilaterally.No drift.  Gait- deferred   ASSESSMENT/PLAN Tiffany Rice is a 89 y.o. female with history of hypertension, hyperlipidemia and depression presenting with dysarthria and left-sided facial droop.  Her last known well time was unclear, she was she was not given TNK, and she  was also noted to be outside the window for mechanical thrombectomy.  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  MRI reveals acute infarct in right putamen and corona radiata.  Stroke: Acute infarct in right BG/CR, etiology: Likely small vessel disease CT head No acute abnormality. Small vessel disease.  SPECT did basilar tip aneurysm CTA head no LVO or proximal stenosis MRI late acute infarct in right posterior putamen and corona radiata 2D Echo EF 65-70% Carotid ultrasound unremarkable LDL 141 HgbA1c pending VTE prophylaxis -Lovenox No antithrombotic prior to admission, now on aspirin 81 mg daily and clopidogrel 75 mg daily DAPT for 3 weeks and then ASA alone.  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  BP goal < 160  Hypertensive urgency Home meds: Olmesartan 40 mg daily Stable now Off cleviprex BP goal < 160 Long-term BP goal normotensive  Hyperlipidemia Home meds: Pravastatin 20 mg daily LDL 141, goal < 70 changed to rosuvastatin 20 mg daily Continue statin at discharge  Other Stroke Risk Factors Advanced Age >/= 76  Former cigarette smoker  Other Active Problems Hypothyroidism-continue home Synthroid depression  Hospital day # [place] , MSN, AGACNP-BC [facility] See Amion for schedule and pager information 06/09/2022 3:43 PM  ATTENDING NOTE: I reviewed above note and agree with the assessment and plan. Pt was seen and examined.   Son at the bedside. Pt is sitting in bed for breakfast. Pt awake, alert, eyes open, orientated to age, place, time and people. No aphasia, fluent language but moderate to severe dysarthria, following all simple commands. Able to name and repeat. No gaze palsy, tracking  bilaterally, visual field full, PERRL. Mild left facial droop. Tongue midline. RUE proximal 4/5 and finger grip 5/5, LUE proximal 4/5 and distal finger grip 4+/5. BLE proximal 3/5 and distally 5/5 Sensation symmetrical bilaterally, b/l FTN intact, gait not tested.    Pt stroke likely small vessel disease given location and risk factors.  On DAPT for 3 weeks and then as alone.  Increase statin from pravastatin 20 to Crestor 20.  PT started recommend home health.  Patient has 7 mm basilar artery tip aneurysm on CT and MRI.  Discussed about intervention benefit and risk with patient and son, they decided no intervention but outpatient monitoring.  BP stable, off Cleviprex, BP goal less than 160 given basilar tip aneurysm.  Passed swallow on diet.  Will follow  For detailed assessment and plan, please refer to above/below as I have made changes wherever appropriate.   Rosalin Hawking, MD PhD Stroke Neurology 06/09/2022 9:15 PM  I spent extensive face-to-face time with the patient and her son, more than 50% of which  was spent in counseling and coordination of care, reviewing test results, images and medication, and discussing the diagnosis, treatment plan and potential prognosis. This patient's care requiresreview of multiple databases, neurological assessment, discussion with family, other specialists and medical decision making of high complexity.      To contact Stroke Continuity provider, please refer to [email or web address] After hours, contact General Neurology

## 2022-06-10 ENCOUNTER — Encounter (HOSPITAL_COMMUNITY): Payer: Self-pay | Admitting: Internal Medicine

## 2022-06-10 ENCOUNTER — Other Ambulatory Visit: Payer: Self-pay

## 2022-06-10 DIAGNOSIS — I639 Cerebral infarction, unspecified: Secondary | ICD-10-CM

## 2022-06-10 DIAGNOSIS — I10 Essential (primary) hypertension: Secondary | ICD-10-CM

## 2022-06-10 DIAGNOSIS — R2990 Unspecified symptoms and signs involving the nervous system: Secondary | ICD-10-CM

## 2022-06-10 LAB — HEMOGLOBIN A1C
Hgb A1c MFr Bld: 5.6 % (ref 4.8–5.6)
Mean Plasma Glucose: 114 mg/dL

## 2022-06-10 MED ORDER — IRBESARTAN 300 MG PO TABS
300.0000 mg | ORAL_TABLET | Freq: Every day | ORAL | Status: DC
  Administered 2022-06-10 – 2022-06-13 (×4): 300 mg via ORAL
  Filled 2022-06-10 (×4): qty 1

## 2022-06-10 NOTE — Evaluation (Signed)
Occupational Therapy Evaluation Patient Details Name: Tiffany Rice MRN: [id number] DOB: [birth year] Today's Date: 06/10/2022   History of Present Illness Pt is an 89yo female who was brough to ED with generalized weakness and impaired speech. 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 PMH: HTN, dyslipidemia, & hypothyroidism   Clinical Impression   Pt currently with functional limitations due to the deficits listed below (see OT Problem List). Prior to admit, pt was living alone, independent with ADL tasks and functional mobility. Pt reports that she did have a recent fall and was thinking about getting a cane. Pt presents with very mild balance deficits and she is very close to baseline. Recommending HHOT to have them evaluate her safety at home.  Pt will benefit from skilled OT to increase their safety and independence with ADL and functional mobility for ADL to facilitate discharge to venue listed below. Next session: Pt would benefit from higher level ADL task and sit to stands from surface height of regular toilet seat. Assess need for shower chair.       Recommendations for follow up therapy are one component of a multi-disciplinary discharge planning process, led by the attending physician.  Recommendations may be updated based on patient status, additional functional criteria and insurance authorization.   Follow Up Recommendations  Home health OT     Assistance Recommended at Discharge PRN  Patient can return home with the following A little help with walking and/or transfers;A little help with bathing/dressing/bathroom;Help with stairs or ramp for entrance;Assistance with cooking/housework;Assist for transportation    Functional Status Assessment  Patient has had a recent decline in their functional status and demonstrates the ability to make significant improvements in function in a reasonable and predictable amount of time.  Equipment Recommendations  None recommended by OT        Precautions / Restrictions Precautions Precautions: Fall Restrictions Weight Bearing Restrictions: No      Mobility Bed Mobility Overal bed mobility: Needs Assistance Bed Mobility: Supine to Sit, Sit to Supine     Supine to sit: Supervision, HOB elevated Sit to supine: Supervision     Patient Response: Cooperative  Transfers Overall transfer level: Needs assistance Equipment used: None Transfers: Sit to/from Stand, Bed to chair/wheelchair/BSC Sit to Stand: Min guard     Step pivot transfers: Min guard     General transfer comment: For safety Min guard provided during transfer and sit to stand although no LOB noted and pt demonstrated safe technique. No device used.      Balance Overall balance assessment: Mild deficits observed, not formally tested       ADL either performed or assessed with clinical judgement   ADL Overall ADL's : Needs assistance/impaired   Eating/Feeding Details (indicate cue type and reason): When taking morning medications with nursing, pt demonstrated difficulty with swallowing pills with water. Holding water and pills in mouth with delayed swallow. Some coughing noted afterwards. Nursing to inform Speech therapy to re-assess pt's swallowing. Grooming: Wash/dry hands;Supervision/safety;Standing   Upper Body Bathing: Supervision/ safety;Sitting   Lower Body Bathing: Supervison/ safety;Sit to/from stand   Upper Body Dressing : Supervision/safety;Sitting   Lower Body Dressing: Supervision/safety;Sit to/from stand   Toilet Transfer: Supervision/safety;Grab bars;Ambulation;Regular Toilet   Toileting- Water quality scientist and Hygiene: Supervision/safety;Sitting/lateral lean               Vision Baseline Vision/History: 1 Wears glasses (reading) Ability to See in Adequate Light: 0 Adequate Patient Visual Report: No change from baseline Vision Assessment?: No apparent  visual deficits            Pertinent Vitals/Pain Pain  Assessment Pain Assessment: No/denies pain     Hand Dominance Right   Extremity/Trunk Assessment Upper Extremity Assessment Upper Extremity Assessment: RUE deficits/detail;LUE deficits/detail RUE Deficits / Details: Pt with baseline shoulder deficits with baseline limited ROM. shoulder flexion/abduction: 3-/5, elbow flexion/extension: 5/5. Intact gross grasp. LUE Deficits / Details: A/ROM WFL in all ranges. 5/5 strength in shoulder, elbow in all ranges. intact gross grasp.   Lower Extremity Assessment Lower Extremity Assessment: Defer to PT evaluation   Cervical / Trunk Assessment Cervical / Trunk Assessment: Normal   Communication Communication Communication: Expressive difficulties (Speech is slurred. Pt reports that it is improving although not 100%)   Cognition Arousal/Alertness: Awake/alert Behavior During Therapy: WFL for tasks assessed/performed Overall Cognitive Status: Within Functional Limits for tasks assessed       General Comments: No impulsiveness noted during evaluation. Pt is A&Ox4. She reports that her memory is still a little foggy although answered all questions appropriately.     General Comments  VSS.            Home Living Family/patient expects to be discharged to:: Private residence Living Arrangements: Alone Available Help at Discharge: Family;Friend(s);Available PRN/intermittently (Son is visitng from [place] and is able to stay with her for a short time.) Type of Home: Other(Comment) (townhouse) Home Access: Stairs to enter [employer] of Steps: 1 Entrance Stairs-Rails: None Home Layout: Two level;Bed/bath upstairs Alternate Level Stairs-Number of Steps: flight Alternate Level Stairs-Rails: Left Bathroom Shower/Tub: Occupational psychologist: Standard     Home Equipment: None          Prior Functioning/Environment Prior Level of Function : Independent/Modified Independent     Mobility Comments: reports one fall  last month and was thinking about getting a cane. does drive and do grocery shopping/errands          OT Problem List: Impaired balance (sitting and/or standing);Decreased activity tolerance      OT Treatment/Interventions: Self-care/ADL training;Therapeutic exercise;Therapeutic activities;Neuromuscular education;Balance training;DME and/or AE instruction;Patient/family education;Energy conservation    OT Goals(Current goals can be found in the care plan section) Acute Rehab OT Goals Patient Stated Goal: to go home OT Goal Formulation: With patient Time For Goal Achievement: 06/24/22 Potential to Achieve Goals: Good  OT Frequency: Min 2X/week       AM-PAC OT "6 Clicks" Daily Activity     Outcome Measure Help from another person eating meals?: None Help from another person taking care of personal grooming?: A Little Help from another person toileting, which includes using toliet, bedpan, or urinal?: A Little Help from another person bathing (including washing, rinsing, drying)?: A Little Help from another person to put on and taking off regular upper body clothing?: A Little Help from another person to put on and taking off regular lower body clothing?: A Little 6 Click Score: 19   End of Session Equipment Utilized During Treatment: Gait belt Nurse Communication: Mobility status  Activity Tolerance: Patient tolerated treatment well Patient left: in bed;with call bell/phone within reach;with bed alarm set;with family/visitor present  OT Visit Diagnosis: Unsteadiness on feet (R26.81);Muscle weakness (generalized) (M62.81)                Time: [id number] OT Time Calculation (min): 34 min Charges:  OT General Charges $OT Visit: 1 Visit OT Evaluation $OT Eval Moderate Complexity: 1 Mod  Therapist, nutritional, OTR/L,CBIS  Supplemental OT - [facility] and [facility] Secure  Chat Preferred    Jashon Ishida, Clarene Duke 06/10/2022, 9:50 AM

## 2022-06-10 NOTE — Progress Notes (Signed)
Patient received to the unit, vital signs stable

## 2022-06-10 NOTE — Evaluation (Signed)
Clinical/Bedside Swallow Evaluation Patient Details  Name: Tiffany Rice MRN: [id number] Date of Birth: [birth year]  Today's Date: 06/10/2022 Time: SLP Start Time (ACUTE ONLY): 0930 SLP Stop Time (ACUTE ONLY): 0945 SLP Time Calculation (min) (ACUTE ONLY): 15 min  Past Medical History:  Past Medical History:  Diagnosis Date   Closed fracture of humerus 08/05/13   pt fell while walking down the street   Complex ovarian cyst 2011   Left side.  Followed by ultrasound.   Depression    Diverticulosis    Esophageal stricture    GERD (gastroesophageal reflux disease)    Hemorrhoids    Hiatal hernia    HTN (hypertension)    Hyperlipidemia    Hypothyroidism    Obesity    Osteopenia 2010   Past Surgical History:  Past Surgical History:  Procedure Laterality Date   CHOLECYSTECTOMY     MOHS SURGERY  1990   TUBAL LIGATION     HPI:  Pt is an 89 y/o female who presented with slurred speech which started 5 days prior to admission. CTA Head: 2. 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 Mouth of the aneurysm measures 6 mm. Pt made NPO after episode of emesis with a meal and report of dysphagia. PMH: hypertension, dyslipidemia, hypothyroidism. Esophagram 03/14/12: Small hiatal hernia, barium pill lodged just above the hiatal hernia and did not pass, consistent with a short segment distal esophageal stricture. Prominent cricopharyngeus muscle. Mild tertiary contractions.    Assessment / Plan / Recommendation  Clinical Impression  Pt was seen for bedside swallow evaluation with her eldest son, Tiffany Rice, present. Both parties reported that the pt has been symptomatic of dysphagia for approximately 10 years, but that some improvement was noted ~5 years prior. Pt stated that foods "get stuck" when she eats too quickly/does not chew food well, and that this symptom is alleviated with vomiting. [name] added that coughing intermittently precedes her episodes of emesis. She denied any significant change  in symptoms since admission, but she stated that she typically avoids most meats except fish since it is softer. Oral mechanism exam was significant for reduced left-sided facial ROM and reduced lingual strength. Dentition was reduced and natural. Pt tolerated all solids and liquids without overt s/s of aspiration. Mastication was mildly prolonged due to dentition, but functional. No symptoms of esophageal dysphagia were noted or reported with trials today. Considering pt's esophageal history and report of avoiding tougher meats, pt agreed to attempt a dysphagia 3 diet with thin liquids and observe swallowing precautions to reduce pharyngeal/esophageal residue. SLP will follow pt to assess tolerance and need for further intervention. SLP Visit Diagnosis: Dysphagia, unspecified (R13.10)    Aspiration Risk  Mild aspiration risk    Diet Recommendation Dysphagia 3 (Mech soft);Thin liquid   Liquid Administration via: Cup;Straw Medication Administration: Whole meds with liquid Supervision: Patient able to self feed;Intermittent supervision to cue for compensatory strategies Compensations: Slow rate;Small sips/bites;Follow solids with liquid Postural Changes: Seated upright at 90 degrees    Other  Recommendations Oral Care Recommendations: Oral care BID    Recommendations for follow up therapy are one component of a multi-disciplinary discharge planning process, led by the attending physician.  Recommendations may be updated based on patient status, additional functional criteria and insurance authorization.  Follow up Recommendations Home health SLP      Assistance Recommended at Discharge    Functional Status Assessment Patient has had a recent decline in their functional status and demonstrates the ability  to make significant improvements in function in a reasonable and predictable amount of time.  Frequency and Duration min 2x/week  2 weeks       Prognosis Prognosis for improved  oropharyngeal function: Good Barriers to Reach Goals: Cognitive deficits      Swallow Study   General Date of Onset: 03/14/12 HPI: Pt is an 89 y/o female who presented with slurred speech which started 5 days prior to admission. CTA Head: 2. 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 Mouth of the aneurysm measures 6 mm. Pt made NPO after episode of emesis with a meal and report of dysphagia. PMH: hypertension, dyslipidemia, hypothyroidism. Esophagram 03/14/12: Small hiatal hernia, barium pill lodged just above the hiatal hernia and did not pass, consistent with a short segment distal esophageal stricture. Prominent cricopharyngeus muscle. Mild tertiary contractions. Type of Study: Bedside Swallow Evaluation Previous Swallow Assessment: none Diet Prior to this Study: NPO Temperature Spikes Noted: No Respiratory Status: Room air History of Recent Intubation: No Behavior/Cognition: Alert;Pleasant mood;Cooperative Oral Cavity Assessment: Within Functional Limits Oral Care Completed by SLP: No Oral Cavity - Dentition: Adequate natural dentition;Missing dentition;Poor condition Vision: Functional for self-feeding Self-Feeding Abilities: Able to feed self Patient Positioning: Upright in bed Baseline Vocal Quality: Low vocal intensity Volitional Cough: Weak Volitional Swallow: Able to elicit    Oral/Motor/Sensory Function Overall Oral Motor/Sensory Function: Mild impairment Facial ROM: Reduced left;Suspected CN VII (facial) dysfunction Facial Symmetry: Abnormal symmetry left;Suspected CN VII (facial) dysfunction Facial Strength: Reduced left;Suspected CN VII (facial) dysfunction Lingual ROM: Reduced left;Reduced right;Suspected CN XII (hypoglossal) dysfunction Lingual Strength: Reduced;Suspected CN XII (hypoglossal) dysfunction   Ice Chips Ice chips: Within functional limits Presentation: Spoon   Thin Liquid Thin Liquid: Within functional limits Presentation: Straw;Cup    Nectar Thick  Nectar Thick Liquid: Not tested   Honey Thick Honey Thick Liquid: Not tested   Puree Puree: Within functional limits Presentation: Spoon   Solid     Solid: Within functional limits Presentation: [place] [address]in Negus, [place], [place] Office number [phone]  Horton Marshall 06/10/2022,10:57 AM

## 2022-06-10 NOTE — Progress Notes (Signed)
[facility] PROGRESS NOTE    Progress Note  Tiffany Rice  [id number] DOB: 1932-03-16 DOA: 06/08/2022 PCP: Haywood Pao, MD     Brief Narrative:   Tiffany Rice is an 89 y.o. female past medical history of essential hypertension, hyperlipidemia brought into the by family for slurred speech that started about 5 days prior to admission, MRI of the brain showed late acute infarct of the posterior putamen and corona radiator  Assessment/Plan:   Acute lacunar infarct/hypertensive emergency: Initially required nicardipine which is now has been weaned off. Neurology was consulted recommended aspirin and Plavix for 3 weeks and then aspirin alone. 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 LDL 141 currently on a statin, A1c 5.6. PT evaluated the patient, she will need home health PT. 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 Speech evaluated the patient she is on a diet.  Dyslipidemia: Increased trends.  Hypothyroidism:  Continue Synthroid.   DVT prophylaxis: lovenox Family Communication:none Status is: Inpatient Remains inpatient appropriate because: Acute CVA    Code Status:     Code Status Orders  (From admission, onward)           Start     Ordered   06/08/22 1659  Do not attempt resuscitation (DNR)  Continuous       Question Answer Comment  If patient has no pulse and is not breathing Do Not Attempt Resuscitation   If patient has a pulse and/or is breathing: Medical Treatment Goals LIMITED ADDITIONAL INTERVENTIONS: Use medication/IV fluids and cardiac monitoring as indicated; Do not use intubation or mechanical ventilation (DNI), also provide comfort medications.  Transfer to Progressive/Stepdown as indicated, avoid Intensive Care.   Consent: Discussion documented in EHR or advanced directives reviewed      06/08/22 1700           Code Status History     This patient  has a current code status but no historical code status.      Advance Directive Documentation    Flowsheet Row Most Recent Value  Type of Advance Directive Healthcare Power of Attorney, Living will  Pre-existing out of facility DNR order (yellow form or pink MOST form) --  "MOST" Form in Place? --         IV Access:   Peripheral IV   Procedures and diagnostic studies:   ECHOCARDIOGRAM COMPLETE  Result Date: 06/09/2022    ECHOCARDIOGRAM REPORT   Patient Name:   Tiffany Rice Date of Exam: 06/09/2022 Medical Rec #:  [id number]          Height:       65.0 in Accession #:    [id number]         Weight:       169.0 lb Date of Birth:  1932/03/26          BSA:          1.841 m Patient Age:    38 years           BP:           136/89 mmHg Patient Gender: F                  HR:           67 bpm. Exam Location:  Inpatient Procedure: 2D Echo, Cardiac Doppler and Color Doppler Indications:    I50.40* Unspecified combined systolic (congestive) and  diastolic                 (congestive) heart failure  History:        Patient has no prior history of Echocardiogram examinations.                 Stroke, Signs/Symptoms:Chest Pain; Risk Factors:Hypertension and                 Dyslipidemia.  Sonographer:    Roseanna Rainbow RDCS Referring Phys: Wing  1. Left ventricular ejection fraction, by estimation, is 65 to 70%. The left ventricle has normal function. The left ventricle has no regional wall motion abnormalities. There is mild asymmetric left ventricular hypertrophy of the basal-septal segment. Left ventricular diastolic parameters are consistent with Grade I diastolic dysfunction (impaired relaxation).  2. Right ventricular systolic function is hyperdynamic. The right ventricular size is normal. There is normal pulmonary artery systolic pressure. The estimated right ventricular systolic pressure is 123XX123 mmHg.  3. Left atrial size was moderately dilated.  4. The mitral valve is normal in  structure. No evidence of mitral valve regurgitation. No evidence of mitral stenosis.  5. The aortic valve is tricuspid. There is mild calcification of the aortic valve. Aortic valve regurgitation is trivial. No aortic stenosis is present.  6. The inferior vena cava is normal in size with greater than 50% respiratory variability, suggesting right atrial pressure of 3 mmHg. FINDINGS  Left Ventricle: Left ventricular ejection fraction, by estimation, is 65 to 70%. The left ventricle has normal function. The left ventricle has no regional wall motion abnormalities. The left ventricular internal cavity size was normal in size. There is  mild asymmetric left ventricular hypertrophy of the basal-septal segment. Left ventricular diastolic parameters are consistent with Grade I diastolic dysfunction (impaired relaxation). Right Ventricle: The right ventricular size is normal. No increase in right ventricular wall thickness. Right ventricular systolic function is hyperdynamic. There is normal pulmonary artery systolic pressure. The tricuspid regurgitant velocity is 2.10 m/s, and with an assumed right atrial pressure of 3 mmHg, the estimated right ventricular systolic pressure is 123XX123 mmHg. Left Atrium: Left atrial size was moderately dilated. Right Atrium: Right atrial size was normal in size. Pericardium: There is no evidence of pericardial effusion. Mitral Valve: The mitral valve is normal in structure. No evidence of mitral valve regurgitation. No evidence of mitral valve stenosis. Tricuspid Valve: The tricuspid valve is normal in structure. Tricuspid valve regurgitation is mild. Aortic Valve: The aortic valve is tricuspid. There is mild calcification of the aortic valve. Aortic valve regurgitation is trivial. No aortic stenosis is present. Pulmonic Valve: The pulmonic valve was normal in structure. Pulmonic valve regurgitation is not visualized. Aorta: The aortic root is normal in size and structure. Venous: The inferior  vena cava is normal in size with greater than 50% respiratory variability, suggesting right atrial pressure of 3 mmHg. IAS/Shunts: No atrial level shunt detected by color flow Doppler.  LEFT VENTRICLE PLAX 2D LVIDd:         3.50 cm     Diastology LVIDs:         2.30 cm     LV e' medial:    5.44 cm/s LV PW:         0.90 cm     LV E/e' medial:  11.9 LV IVS:        1.30 cm     LV e' lateral:   6.85 cm/s LVOT diam:  2.10 cm     LV E/e' lateral: 9.4 LV SV:         90 LV SV Index:   49 LVOT Area:     3.46 cm  LV Volumes (MOD) LV vol d, MOD A2C: 49.2 ml LV vol d, MOD A4C: 31.6 ml LV vol s, MOD A2C: 10.7 ml LV vol s, MOD A4C: 10.2 ml LV SV MOD A2C:     38.5 ml LV SV MOD A4C:     31.6 ml LV SV MOD BP:      28.8 ml RIGHT VENTRICLE             IVC RV S prime:     11.90 cm/s  IVC diam: 1.50 cm TAPSE (M-mode): 1.7 cm LEFT ATRIUM             Index        RIGHT ATRIUM           Index LA diam:        1.70 cm 0.92 cm/m   RA Area:     13.10 cm LA Vol (A2C):   24.8 ml 13.47 ml/m  RA Volume:   33.40 ml  18.14 ml/m LA Vol (A4C):   13.4 ml 7.28 ml/m LA Biplane Vol: 19.1 ml 10.37 ml/m  AORTIC VALVE LVOT Vmax:   117.00 cm/s LVOT Vmean:  81.100 cm/s LVOT VTI:    0.259 m  AORTA Ao Root diam: 2.90 cm Ao Asc diam:  3.40 cm MITRAL VALVE                TRICUSPID VALVE MV Area (PHT): 2.66 cm     TR Peak grad:   17.6 mmHg MV Decel Time: 285 msec     TR Vmax:        210.00 cm/s MV E velocity: 64.50 cm/s MV A velocity: 114.00 cm/s  SHUNTS MV E/A ratio:  0.57         Systemic VTI:  0.26 m                             Systemic Diam: 2.10 cm Dalton McleanMD Electronically signed by Franki Monte Signature Date/Time: 06/09/2022/4:56:42 PM    Final    VAS US CAROTID (at [facility] and [facility] only)  Result Date: 06/09/2022 Carotid Arterial Duplex Study Patient Name:  Tiffany Rice  Date of Exam:   06/09/2022 Medical Rec #: [id number]           Accession #:    [id number] Date of Birth: 1931/06/26           Patient Gender: F Patient Age:   15 years Exam  Location:  [facility] Procedure:      VAS US CAROTID Referring Phys: Domenic Polite --------------------------------------------------------------------------------  Indications:       CVA. Risk Factors:      Hypertension, hyperlipidemia. Comparison Study:  10-09-2018 Prior carotid duplex study. Performing Technologist: Darlin Coco RDMS, RVT  Examination Guidelines: A complete evaluation includes B-mode imaging, spectral Doppler, color Doppler, and power Doppler as needed of all accessible portions of each vessel. Bilateral testing is considered an integral part of a complete examination. Limited examinations for reoccurring indications may be performed as noted.  Right Carotid Findings: +----------+--------+--------+--------+------------------+--------+           PSV cm/sEDV cm/sStenosisPlaque DescriptionComments +----------+--------+--------+--------+------------------+--------+ CCA Prox  54      12                                         +----------+--------+--------+--------+------------------+--------+  CCA Distal42      11              calcific                   +----------+--------+--------+--------+------------------+--------+ ICA Prox  30      8       1-39%   heterogenous               +----------+--------+--------+--------+------------------+--------+ ICA Distal29      9                                          +----------+--------+--------+--------+------------------+--------+ ECA       60      8                                          +----------+--------+--------+--------+------------------+--------+ +----------+--------+-------+----------------+-------------------+           PSV cm/sEDV cmsDescribe        Arm Pressure (mmHG) +----------+--------+-------+----------------+-------------------+ [id number]             Multiphasic, WNL                    +----------+--------+-------+----------------+-------------------+  +---------+--------+--+--------+-+---------+ VertebralPSV cm/s34EDV cm/s7Antegrade +---------+--------+--+--------+-+---------+  Left Carotid Findings: +----------+--------+--------+--------+------------------+--------+           PSV cm/sEDV cm/sStenosisPlaque DescriptionComments +----------+--------+--------+--------+------------------+--------+ CCA Prox  53      7                                          +----------+--------+--------+--------+------------------+--------+ CCA Distal42      8                                          +----------+--------+--------+--------+------------------+--------+ ICA Prox  26      6       1-39%   hyperechoic                +----------+--------+--------+--------+------------------+--------+ ICA Distal89      21                                         +----------+--------+--------+--------+------------------+--------+ ECA       54      9                                          +----------+--------+--------+--------+------------------+--------+ +----------+--------+--------+----------------+-------------------+           PSV cm/sEDV cm/sDescribe        Arm Pressure (mmHG) +----------+--------+--------+----------------+-------------------+ [id number]              Multiphasic, WNL                    +----------+--------+--------+----------------+-------------------+ +---------+--------+--+--------+-+---------+ VertebralPSV cm/s41EDV cm/s8Antegrade +---------+--------+--+--------+-+---------+   Summary: Right Carotid: Velocities in the right ICA are consistent with a 1-39% stenosis. Left Carotid: Velocities in the left ICA  are consistent with a 1-39% stenosis. Vertebrals:  Bilateral vertebral arteries demonstrate antegrade flow. Subclavians: Normal flow hemodynamics were seen in bilateral subclavian              arteries. *See table(s) above for measurements and observations.     Preliminary    MR BRAIN WO  CONTRAST  Result Date: 06/09/2022 CLINICAL DATA:  Stroke, follow-up.  Slurred speech. EXAM: MRI HEAD WITHOUT CONTRAST TECHNIQUE: Multiplanar, multiecho pulse sequences of the brain and surrounding structures were obtained without intravenous contrast. COMPARISON:  CTA head 06/08/2022.  Head CT 06/08/2022. FINDINGS: Brain: Late acute infarct in the right posterior putamen and corona radiata. No acute intracranial hemorrhage. No mass or midline shift. Unchanged moderate chronic small-vessel disease and old lacunar infarct in the left basal ganglia. Chronic microhemorrhage in the right globus pallidus. No hydrocephalus or extra-axial collection. Vascular: Redemonstrated ectasia of the right supraclinoid ICA and basilar tip aneurysm, better evaluated on previous day's CTA. Skull and upper cervical spine: Normal marrow signal. Sinuses/Orbits: Unremarkable. Other: None. IMPRESSION: 1. Late acute infarct in the right posterior putamen and corona radiata. 2. 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 Electronically Signed   By: Emmit Alexanders M.D.   On: 06/09/2022 10:22   CT Angio Head W or Wo Contrast  Result Date: 06/08/2022 CLINICAL DATA:  cerebral aneurysm. Stroke-like symptoms. Slurred speech. EXAM: CT ANGIOGRAPHY HEAD TECHNIQUE: Multidetector CT imaging of the head was performed using the standard protocol during bolus administration of intravenous contrast. Multiplanar CT image reconstructions and MIPs were obtained to evaluate the vascular anatomy. RADIATION DOSE REDUCTION: This exam was performed according to the departmental dose-optimization program which includes automated exposure control, adjustment of the mA and/or kV according to patient size and/or use of iterative reconstruction technique. CONTRAST:  58m OMNIPAQUE IOHEXOL 350 MG/ML SOLN COMPARISON:  Head CT earlier same day. FINDINGS: CTA HEAD Anterior circulation: Both internal carotid  arteries are patent through the skull base and siphon regions. There is atherosclerotic ectasia of the carotid siphons, right more than left. Aneurysmal dilatation with vascular diameter extending up to 8.4 mm on the right and 6.7 mm on the left. No focal berry aneurysm in this region. The anterior and middle cerebral vessels are patent. No large vessel occlusion or proximal stenosis. Posterior circulation: Both vertebral arteries are patent through the foramen magnum to the basilar artery. The left vertebral artery is dominant. No basilar stenosis. Posterior circulation branch vessels are patent. There is a wide mouth aneurysm at the basilar tip measuring up to 7 mm in diameter. Mouth of the aneurysm measures 6 mm. Venous sinuses: Patent and normal. Anatomic variants: None significant. Review of the MIP images confirms the above findings. IMPRESSION: 1. No intracranial large vessel occlusion or proximal stenosis. 2. 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 Mouth of the aneurysm measures 6 mm. 3. Atherosclerotic ectasia of the carotid siphons, right more than left. Aneurysmal dilatation with vascular diameter extending up to 8.4 mm on the right and 6.7 mm on the left. No focal berry aneurysm in this region. Electronically Signed   By: MNelson ChimesM.D.   On: 06/08/2022 15:41   CT Head Wo Contrast  Result Date: 06/08/2022 CLINICAL DATA:  Neuro deficit, acute, stroke suspected EXAM: CT HEAD WITHOUT CONTRAST TECHNIQUE: Contiguous axial images were obtained from the base of the skull through the vertex without intravenous contrast. RADIATION DOSE REDUCTION: This exam was performed according to  the departmental dose-optimization program which includes automated exposure control, adjustment of the mA and/or kV according to patient size and/or use of iterative reconstruction technique. COMPARISON:  None Available. FINDINGS: Brain: No evidence of acute large vascular territory infarction, hemorrhage, hydrocephalus,  extra-axial collection or mass lesion/mass effect. Patchy white matter hypodensities, nonspecific but compatible with chronic microvascular ischemic disease. Remote perforator infarct in the bilateral basal ganglia. Remote left cerebellar infarct. Vascular: Suspected aneurysm of the basilar tip. No hyperdense vessel identified. Calcific atherosclerosis. Skull: No acute fracture. Sinuses/Orbits: Clear sinuses.  No acute orbital findings. Other: No mastoid effusions. IMPRESSION: 1. Suspected aneurysm of the basilar tip. Recommend CTA to confirm and further evaluate. 2. Otherwise, no evidence of acute intracranial abnormality. Chronic microvascular ischemic disease and remote appearing infarcts. MRI could better assess for acute infarct if clinically warranted. Electronically Signed   By: Margaretha Sheffield M.D.   On: 06/08/2022 15:24     Medical Consultants:   None.   Subjective:    Alexandrya Lafevers Naser no complains  Objective:    Vitals:   06/10/22 0200 06/10/22 0300 06/10/22 0400 06/10/22 0500  BP: (!) 145/74 (!) 150/75 (!) 147/83 (!) 162/75  Pulse: 70 66 68 69  Resp: (!) '23 16 18 '$ (!) 23  Temp:   98.3 F (36.8 C)   TempSrc:   Oral   SpO2: 95% 95% 95% 97%  Weight:      Height:       SpO2: 97 % O2 Flow Rate (L/min): 2 L/min   Intake/Output Summary (Last 24 hours) at 06/10/2022 0654 Last data filed at 06/09/2022 1700 Gross per 24 hour  Intake 418.9 ml  Output --  Net 418.9 ml   Filed Weights   06/08/22 1320  Weight: 76.7 kg    Exam: General exam: In no acute distress. Respiratory system: Good air movement and clear to auscultation. Cardiovascular system: S1 & S2 heard, RRR. No JVD. Gastrointestinal system: Abdomen is nondistended, soft and nontender.  Central nervous system: Alert and oriented. No focal neurological deficits. Extremities: No pedal edema. Skin: No rashes, lesions or ulcers Psychiatry: Judgement and insight appear normal. Mood & affect appropriate.    Data  Reviewed:    Labs: Basic Metabolic Panel: Recent Labs  Lab 06/08/22 1536 06/08/22 1559 06/08/22 1957  NA 138 141  --   K 3.4* 3.9  --   CL 105 105  --   CO2 26  --   --   GLUCOSE 99 89  --   BUN 8 8  --   CREATININE 0.83 0.60 0.74  CALCIUM 9.5  --   --    GFR Estimated Creatinine Clearance: 49.8 mL/min (by C-G formula based on SCr of 0.74 mg/dL). Liver Function Tests: No results for input(s): "AST", "ALT", "ALKPHOS", "BILITOT", "PROT", "ALBUMIN" in the last 168 hours. No results for input(s): "LIPASE", "AMYLASE" in the last 168 hours. No results for input(s): "AMMONIA" in the last 168 hours. Coagulation profile No results for input(s): "INR", "PROTIME" in the last 168 hours. COVID-19 Labs  No results for input(s): "DDIMER", "FERRITIN", "LDH", "CRP" in the last 72 hours.  No results found for: "SARSCOV2NAA"  CBC: Recent Labs  Lab 06/08/22 1536 06/08/22 1559 06/08/22 1957  WBC 5.3  --  5.1  HGB 13.7 14.3 16.0*  HCT 41.8 42.0 45.7  MCV 89.5  --  86.1  PLT 198  --  209   Cardiac Enzymes: No results for input(s): "CKTOTAL", "CKMB", "CKMBINDEX", "TROPONINI" in the last  168 hours. BNP (last 3 results) No results for input(s): "PROBNP" in the last 8760 hours. CBG: Recent Labs  Lab 06/08/22 1322  GLUCAP 141*   D-Dimer: No results for input(s): "DDIMER" in the last 72 hours. Hgb A1c: Recent Labs    06/08/22 1957  HGBA1C 5.6   Lipid Profile: Recent Labs    06/09/22 0442  CHOL 206*  HDL 48  LDLCALC 141*  TRIG 84  CHOLHDL 4.3   Thyroid function studies: No results for input(s): "TSH", "T4TOTAL", "T3FREE", "THYROIDAB" in the last 72 hours.  Invalid input(s): "FREET3" Anemia work up: No results for input(s): "VITAMINB12", "FOLATE", "FERRITIN", "TIBC", "IRON", "RETICCTPCT" in the last 72 hours. Sepsis Labs: Recent Labs  Lab 06/08/22 1536 06/08/22 1957  WBC 5.3 5.1   Microbiology Recent Results (from the past 240 hour(s))  MRSA Next Gen by PCR,  Nasal     Status: None   Collection Time: 06/08/22  7:43 PM   Specimen: Nasal Mucosa; Nasal Swab  Result Value Ref Range Status   MRSA by PCR Next Gen NOT DETECTED NOT DETECTED Final    Comment: (NOTE) The GeneXpert MRSA Assay (FDA approved for NASAL specimens only), is one component of a comprehensive MRSA colonization surveillance program. It is not intended to diagnose MRSA infection nor to guide or monitor treatment for MRSA infections. Test performance is not FDA approved in patients less than 42 years old. Performed at [facility], [place] [address] [place], [place] [address]      Medications:    aspirin EC  81 mg Oral Daily   carvedilol  6.25 mg Oral BID WC   Chlorhexidine Gluconate Cloth  6 each Topical Daily   clopidogrel  75 mg Oral Daily   enoxaparin (LOVENOX) injection  40 mg Subcutaneous Q24H   levothyroxine  88 mcg Oral Q0600   rosuvastatin  20 mg Oral Daily   Continuous Infusions:  sodium chloride        LOS: 2 days   Charlynne Cousins  [facility]  06/10/2022, 6:54 AM

## 2022-06-10 NOTE — Progress Notes (Signed)
Speech Language Pathology Treatment:  (Dysarthria)  Patient Details Name: Tiffany Rice MRN: [id number] DOB: [birth year] Today's Date: 06/10/2022 Time: [id number] SLP Time Calculation (min) (ACUTE ONLY): 19 min  Assessment / Plan / Recommendation Clinical Impression  Pt was seen for dysarthria treatment with her son, [name], present and she was cooperative during the session. Both parties were educated regarding the nature of dysarthria, and compensatory strategies to improve speech intelligibility. Pt verbalized understanding regarding all areas of education. She used compensatory strategies at the phrase level with 67% accuracy increasing to 83% accuracy with moderate prompts and intermittent models for overarticulation, rate, and vocal intensity. Some generalization was noted to the conversational level and her son, who reportedly has a hearing impairment, stated that he was better able to understand her at the end of the session. SLP will continue to follow pt.     HPI HPI: Pt is an 89 y/o female who presented with slurred speech which started 5 days prior to admission. CTA Head: 2. 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 Mouth of the aneurysm measures 6 mm. Pt made NPO after episode of emesis with a meal and report of dysphagia. PMH: hypertension, dyslipidemia, hypothyroidism. Esophagram 03/14/12: Small hiatal hernia, barium pill lodged just above the hiatal hernia and did not pass, consistent with a short segment distal esophageal stricture. Prominent cricopharyngeus muscle. Mild tertiary contractions.      SLP Plan  Continue with current plan of care      Recommendations for follow up therapy are one component of a multi-disciplinary discharge planning process, led by the attending physician.  Recommendations may be updated based on patient status, additional functional criteria and insurance authorization.    Recommendations  Diet recommendations: Dysphagia 3 (mechanical soft);Thin  liquid Liquids provided via: Cup;Straw Medication Administration: Whole meds with liquid Supervision: Intermittent supervision to cue for compensatory strategies Compensations: Slow rate;Small sips/bites;Follow solids with liquid Postural Changes and/or Swallow Maneuvers: Seated upright 90 degrees;Upright 30-60 min after meal                Oral Care Recommendations: Oral care BID Follow Up Recommendations: Home health SLP Assistance recommended at discharge: Intermittent Supervision/Assistance SLP Visit Diagnosis: Dysphagia, unspecified (R13.10) Plan: Continue with current plan of care         Glorian Mcdonell I. Hardin Negus, [place], [place] Office number [phone]   Horton Marshall  06/10/2022, 11:12 AM

## 2022-06-10 NOTE — Progress Notes (Signed)
SLP Cancellation Note  Patient Details Name: SKYLETTE BAYARD MRN: [id number] DOB: 08-Jan-1932   Cancelled treatment:       Reason Eval/Treat Not Completed: Patient at procedure or test/unavailable (Pt working with OT at this time. SLP will follow up.)  Emmalea Treanor I. Hardin Negus, [place], [place] Office number [phone]  Horton Marshall 06/10/2022, 9:08 AM

## 2022-06-10 NOTE — Progress Notes (Addendum)
STROKE TEAM PROGRESS NOTE   INTERVAL HISTORY Patient is seen in her room with no family at the bedside.  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  PT/OT are recommending home health therapy.  Vitals:   06/10/22 0700 06/10/22 0800 06/10/22 0900 06/10/22 1000  BP: (!) 148/84 (!) 156/96 (!) 147/91 (!) 163/99  Pulse: 69 64 70 64  Resp: (!) 22 (!) '22 14 15  '$ Temp:  98.2 F (36.8 C)    TempSrc:  Oral    SpO2: 96% 93% 95% 97%  Weight:      Height:       CBC:  Recent Labs  Lab 06/08/22 1536 06/08/22 1559 06/08/22 1957  WBC 5.3  --  5.1  HGB 13.7 14.3 16.0*  HCT 41.8 42.0 45.7  MCV 89.5  --  86.1  PLT 198  --  [id number]    Basic Metabolic Panel:  Recent Labs  Lab 06/08/22 1536 06/08/22 1559 06/08/22 1957  NA 138 141  --   K 3.4* 3.9  --   CL 105 105  --   CO2 26  --   --   GLUCOSE 99 89  --   BUN 8 8  --   CREATININE 0.83 0.60 0.74  CALCIUM 9.5  --   --     Lipid Panel:  Recent Labs  Lab 06/09/22 0442  CHOL 206*  TRIG 84  HDL 48  CHOLHDL 4.3  VLDL 17  LDLCALC 141*    HgbA1c:  Recent Labs  Lab 06/08/22 1957  HGBA1C 5.6   Urine Drug Screen: No results for input(s): "LABOPIA", "COCAINSCRNUR", "LABBENZ", "AMPHETMU", "THCU", "LABBARB" in the last 168 hours.  Alcohol Level No results for input(s): "ETH" in the last 168 hours.  IMAGING past 24 hours ECHOCARDIOGRAM COMPLETE  Result Date: 06/09/2022    ECHOCARDIOGRAM REPORT   Patient Name:   JESSILYNN FOYE Date of Exam: 06/09/2022 Medical Rec #:  [id number]          Height:       65.0 in Accession #:    [id number]         Weight:       169.0 lb Date of Birth:  October 01, 1931          BSA:          1.841 m Patient Age:    33 years           BP:           136/89 mmHg Patient Gender: F                  HR:           67 bpm. Exam Location:  Inpatient Procedure: 2D Echo, Cardiac Doppler and Color Doppler Indications:    I50.40* Unspecified combined systolic (congestive) and diastolic                  (congestive) heart failure  History:        Patient has no prior history of Echocardiogram examinations.                 Stroke, Signs/Symptoms:Chest Pain; Risk Factors:Hypertension and                 Dyslipidemia.  Sonographer:    Roseanna Rainbow RDCS Referring Phys: [place]  1. Left ventricular ejection fraction, by estimation, is 65 to 70%. The left ventricle has normal  function. The left ventricle has no regional wall motion abnormalities. There is mild asymmetric left ventricular hypertrophy of the basal-septal segment. Left ventricular diastolic parameters are consistent with Grade I diastolic dysfunction (impaired relaxation).  2. Right ventricular systolic function is hyperdynamic. The right ventricular size is normal. There is normal pulmonary artery systolic pressure. The estimated right ventricular systolic pressure is 123XX123 mmHg.  3. Left atrial size was moderately dilated.  4. The mitral valve is normal in structure. No evidence of mitral valve regurgitation. No evidence of mitral stenosis.  5. The aortic valve is tricuspid. There is mild calcification of the aortic valve. Aortic valve regurgitation is trivial. No aortic stenosis is present.  6. The inferior vena cava is normal in size with greater than 50% respiratory variability, suggesting right atrial pressure of 3 mmHg. FINDINGS  Left Ventricle: Left ventricular ejection fraction, by estimation, is 65 to 70%. The left ventricle has normal function. The left ventricle has no regional wall motion abnormalities. The left ventricular internal cavity size was normal in size. There is  mild asymmetric left ventricular hypertrophy of the basal-septal segment. Left ventricular diastolic parameters are consistent with Grade I diastolic dysfunction (impaired relaxation). Right Ventricle: The right ventricular size is normal. No increase in right ventricular wall thickness. Right ventricular systolic function is hyperdynamic. There is normal  pulmonary artery systolic pressure. The tricuspid regurgitant velocity is 2.10 m/s, and with an assumed right atrial pressure of 3 mmHg, the estimated right ventricular systolic pressure is 123XX123 mmHg. Left Atrium: Left atrial size was moderately dilated. Right Atrium: Right atrial size was normal in size. Pericardium: There is no evidence of pericardial effusion. Mitral Valve: The mitral valve is normal in structure. No evidence of mitral valve regurgitation. No evidence of mitral valve stenosis. Tricuspid Valve: The tricuspid valve is normal in structure. Tricuspid valve regurgitation is mild. Aortic Valve: The aortic valve is tricuspid. There is mild calcification of the aortic valve. Aortic valve regurgitation is trivial. No aortic stenosis is present. Pulmonic Valve: The pulmonic valve was normal in structure. Pulmonic valve regurgitation is not visualized. Aorta: The aortic root is normal in size and structure. Venous: The inferior vena cava is normal in size with greater than 50% respiratory variability, suggesting right atrial pressure of 3 mmHg. IAS/Shunts: No atrial level shunt detected by color flow Doppler.  LEFT VENTRICLE PLAX 2D LVIDd:         3.50 cm     Diastology LVIDs:         2.30 cm     LV e' medial:    5.44 cm/s LV PW:         0.90 cm     LV E/e' medial:  11.9 LV IVS:        1.30 cm     LV e' lateral:   6.85 cm/s LVOT diam:     2.10 cm     LV E/e' lateral: 9.4 LV SV:         90 LV SV Index:   49 LVOT Area:     3.46 cm  LV Volumes (MOD) LV vol d, MOD A2C: 49.2 ml LV vol d, MOD A4C: 31.6 ml LV vol s, MOD A2C: 10.7 ml LV vol s, MOD A4C: 10.2 ml LV SV MOD A2C:     38.5 ml LV SV MOD A4C:     31.6 ml LV SV MOD BP:      28.8 ml RIGHT VENTRICLE  IVC RV S prime:     11.90 cm/s  IVC diam: 1.50 cm TAPSE (M-mode): 1.7 cm LEFT ATRIUM             Index        RIGHT ATRIUM           Index LA diam:        1.70 cm 0.92 cm/m   RA Area:     13.10 cm LA Vol (A2C):   24.8 ml 13.47 ml/m  RA Volume:    33.40 ml  18.14 ml/m LA Vol (A4C):   13.4 ml 7.28 ml/m LA Biplane Vol: 19.1 ml 10.37 ml/m  AORTIC VALVE LVOT Vmax:   117.00 cm/s LVOT Vmean:  81.100 cm/s LVOT VTI:    0.259 m  AORTA Ao Root diam: 2.90 cm Ao Asc diam:  3.40 cm MITRAL VALVE                TRICUSPID VALVE MV Area (PHT): 2.66 cm     TR Peak grad:   17.6 mmHg MV Decel Time: 285 msec     TR Vmax:        210.00 cm/s MV E velocity: 64.50 cm/s MV A velocity: 114.00 cm/s  SHUNTS MV E/A ratio:  0.57         Systemic VTI:  0.26 m                             Systemic Diam: 2.10 cm Dalton McleanMD Electronically signed by Franki Monte Signature Date/Time: 06/09/2022/4:56:42 PM    Final    VAS US CAROTID (at [facility] and [facility] only)  Result Date: 06/09/2022 Carotid Arterial Duplex Study Patient Name:  MAYBELLE FERO  Date of Exam:   06/09/2022 Medical Rec #: [id number]           Accession #:    [id number] Date of Birth: December 29, 1931           Patient Gender: F Patient Age:   61 years Exam Location[facility]  [facility] Procedure:      VAS US CAROTID Referring Phys: Domenic Polite --------------------------------------------------------------------------------  Indications:       CVA. Risk Factors:      Hypertension, hyperlipidemia. Comparison Study:  10-09-2018 Prior carotid duplex study. Performing Technologist: Darlin Coco RDMS, RVT  Examination Guidelines: A complete evaluation includes B-mode imaging, spectral Doppler, color Doppler, and power Doppler as needed of all accessible portions of each vessel. Bilateral testing is considered an integral part of a complete examination. Limited examinations for reoccurring indications may be performed as noted.  Right Carotid Findings: +----------+--------+--------+--------+------------------+--------+           PSV cm/sEDV cm/sStenosisPlaque DescriptionComments +----------+--------+--------+--------+------------------+--------+ CCA Prox  54      12                                          +----------+--------+--------+--------+------------------+--------+ CCA Distal42      11              calcific                   +----------+--------+--------+--------+------------------+--------+ ICA Prox  30      8       1-39%   heterogenous               +----------+--------+--------+--------+------------------+--------+  ICA Distal29      9                                          +----------+--------+--------+--------+------------------+--------+ ECA       60      8                                          +----------+--------+--------+--------+------------------+--------+ +----------+--------+-------+----------------+-------------------+           PSV cm/sEDV cmsDescribe        Arm Pressure (mmHG) +----------+--------+-------+----------------+-------------------+ [id number]             Multiphasic, WNL                    +----------+--------+-------+----------------+-------------------+ +---------+--------+--+--------+-+---------+ VertebralPSV cm/s34EDV cm/s7Antegrade +---------+--------+--+--------+-+---------+  Left Carotid Findings: +----------+--------+--------+--------+------------------+--------+           PSV cm/sEDV cm/sStenosisPlaque DescriptionComments +----------+--------+--------+--------+------------------+--------+ CCA Prox  53      7                                          +----------+--------+--------+--------+------------------+--------+ CCA Distal42      8                                          +----------+--------+--------+--------+------------------+--------+ ICA Prox  26      6       1-39%   hyperechoic                +----------+--------+--------+--------+------------------+--------+ ICA Distal89      21                                         +----------+--------+--------+--------+------------------+--------+ ECA       54      9                                           +----------+--------+--------+--------+------------------+--------+ +----------+--------+--------+----------------+-------------------+           PSV cm/sEDV cm/sDescribe        Arm Pressure (mmHG) +----------+--------+--------+----------------+-------------------+ [id number]              Multiphasic, WNL                    +----------+--------+--------+----------------+-------------------+ +---------+--------+--+--------+-+---------+ VertebralPSV cm/s41EDV cm/s8Antegrade +---------+--------+--+--------+-+---------+   Summary: Right Carotid: Velocities in the right ICA are consistent with a 1-39% stenosis. Left Carotid: Velocities in the left ICA are consistent with a 1-39% stenosis. Vertebrals:  Bilateral vertebral arteries demonstrate antegrade flow. Subclavians: Normal flow hemodynamics were seen in bilateral subclavian              arteries. *See table(s) above for measurements and observations.     Preliminary     PHYSICAL EXAM General: Alert, well-nourished, well-developed elderly patient in no acute distress Respiratory: Regular, unlabored respirations on room air  NEURO:  Mental  Status: AA&Ox3  Speech/Language: speech is with very slight dysarthria but no aphasia  Cranial Nerves:  II: PERRL. Visual fields full. III, IV, VI: EOMI. Eyelids elevate symmetrically.  V: Sensation is intact to light touch and symmetrical to face.  VII: Slight left facial droop VIII: hearing intact to voice. IX, X: Voice is dysarthric [id number] shrug 5/5. XII: tongue is midline without fasciculations. Motor: 5/5 strength to right upper extremity and bilateral lower extremities, 5 out of 5 strength to left upper extremity Tone: is normal and bulk is normal Sensation- Intact to light touch bilaterally.  Coordination: FTN intact bilaterally.No drift.  Gait- deferred   ASSESSMENT/PLAN Ms. GLENDELL Rice is a 89 y.o. female with history of hypertension, hyperlipidemia and depression  presenting with dysarthria and left-sided facial droop.  Her last known well time was unclear, she was she was not given TNK, and she was also noted to be outside the window for mechanical thrombectomy.  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  MRI reveals acute infarct in right putamen and corona radiata.  Stroke: Acute infarct in right BG/CR, etiology: Likely small vessel disease CT head No acute abnormality. Small vessel disease.  SPECT did basilar tip aneurysm CTA head no LVO or proximal stenosis MRI late acute infarct in right posterior putamen and corona radiata 2D Echo EF 65-70% Carotid ultrasound unremarkable LDL 141 HgbA1c 5.6 VTE prophylaxis -Lovenox No antithrombotic prior to admission, now on aspirin 81 mg daily and clopidogrel 75 mg daily DAPT for 3 weeks and then ASA alone.  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  BP goal < 160  Hypertensive urgency Home meds: Olmesartan 40 mg daily Stable now Off cleviprex Currently on coreg 6.25 Will resume home ARB BP goal < 160 Long-term BP goal normotensive  Hyperlipidemia Home meds: Pravastatin 20 mg daily LDL 141, goal < 70 changed to rosuvastatin 20 mg daily Continue statin at discharge  Other Stroke Risk Factors Advanced Age >/= 33  Former cigarette smoker  Other Active Problems Hypothyroidism-continue home Synthroid depression  Hospital day # [place] , MSN, AGACNP-BC [facility] See Amion for schedule and pager information 06/10/2022 10:28 AM  ATTENDING NOTE: I reviewed above note and agree with the assessment and plan. Pt was seen and examined.   PT/OT is at the bedside. Pt sitting at the edge of bed, complaining the BP cuff is painful when  inflated. BP 140s now, goal < 160. Currently on coreg, will resume home ARB. BP monitoring. Continue DAPT and statin. PT/OT recommend HH PT. Pt again declined endovascular intervention of the basilar artery tip aneurysm. She said she is 67 and not interested in those procedure or surgeries.   For detailed assessment and plan, please refer to above/below as I have made changes wherever appropriate.   Neurology will sign off. Please call with questions. Pt will follow up with stroke clinic NP at [facility] in about 4 weeks. Thanks for the consult.   Rosalin Hawking, MD PhD Stroke Neurology 06/10/2022 1:58 PM    To contact Stroke Continuity provider, please refer to [email or web address] After hours, contact General Neurology

## 2022-06-11 ENCOUNTER — Other Ambulatory Visit (HOSPITAL_COMMUNITY): Payer: Self-pay

## 2022-06-11 MED ORDER — ASPIRIN 81 MG PO TBEC
81.0000 mg | DELAYED_RELEASE_TABLET | Freq: Every day | ORAL | 12 refills | Status: DC
  Filled 2022-06-11: qty 30, 30d supply, fill #0

## 2022-06-11 MED ORDER — CLOPIDOGREL BISULFATE 75 MG PO TABS
75.0000 mg | ORAL_TABLET | Freq: Every day | ORAL | 0 refills | Status: AC
  Filled 2022-06-11: qty 19, 19d supply, fill #0

## 2022-06-11 MED ORDER — CARVEDILOL 6.25 MG PO TABS
6.2500 mg | ORAL_TABLET | Freq: Two times a day (BID) | ORAL | 3 refills | Status: DC
  Filled 2022-06-11: qty 60, 30d supply, fill #0

## 2022-06-11 MED ORDER — ROSUVASTATIN CALCIUM 20 MG PO TABS
20.0000 mg | ORAL_TABLET | Freq: Every day | ORAL | 3 refills | Status: DC
  Filled 2022-06-11: qty 30, 30d supply, fill #0

## 2022-06-11 NOTE — Progress Notes (Signed)
Patient TOC home medications delivered to room and AVS medication details updated. Primary RN aware. Patient awaiting walker and family for transportation.

## 2022-06-11 NOTE — Discharge Summary (Signed)
Physician Discharge Summary  Tiffany Rice [id number] DOB: 07/08/1931 DOA: 06/08/2022  PCP: Haywood Pao, MD  Admit date: 06/08/2022 Discharge date: 06/11/2022  Admitted From: Home Disposition:  Home  Recommendations for Outpatient Follow-up:  Follow up with neurology in 1-2 weeks Please obtain BMP/CBC in one week Home with physical therapy patient refused angiogram  Home Health:Yes Equipment/Devices:None  Discharge Condition:Stable CODE STATUS:Full Diet recommendation: Heart Healthy   Brief/Interim Summary: 89 y.o. female past medical history of essential hypertension, hyperlipidemia brought into the by family for slurred speech that started about 5 days prior to admission, MRI of the brain showed late acute infarct of the posterior putamen and corona radiata.  Discharge Diagnoses:  Principal Problem:   Slurred speech Active Problems:   Hypertensive emergency   CVA (cerebral vascular accident) ([place])  Acute lacunar infarct/hypertensive emergency: Initially required nicardipine placed in the ICU her blood pressure improved neurology was consulted. 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 Neurology agree with aspirin and Plavix for 3 weeks then aspirin alone. Her LDL was 141 she was started on a statin. A1c is 5.8. Physical therapy evaluated the patient recommended home health PT.  Dyslipidemia: Continue Crestor.  Hypothyroidism: Continue Synthroid  Discharge Instructions  Discharge Instructions     Ambulatory referral to Neurology   Complete by: As directed    Follow up with stroke clinic NP (Jessica Vanschaick or Cecille Rubin, if both not available, consider Zachery Dauer, or Ahern) at [facility] in about 4 weeks. Thanks.   Diet - low sodium heart healthy   Complete by: As directed    Increase activity slowly   Complete by: As directed       Allergies as of  06/11/2022       Reactions   Bactrim [sulfamethoxazole-trimethoprim] Other (See Comments)   Mood swings        Medication List     TAKE these medications    aspirin EC 81 MG tablet Take 1 tablet (81 mg total) by mouth daily. Swallow whole.   carvedilol 6.25 MG tablet Commonly known as: COREG Take 1 tablet (6.25 mg total) by mouth 2 (two) times daily with a meal.   clopidogrel 75 MG tablet Commonly known as: PLAVIX Take 1 tablet (75 mg total) by mouth daily for 19 days.   levothyroxine 88 MCG tablet Commonly known as: SYNTHROID Take 88 mcg by mouth daily.   olmesartan 40 MG tablet Commonly known as: BENICAR Take 40 mg by mouth daily.   potassium chloride 10 MEQ tablet Commonly known as: KLOR-CON Take 10 mEq by mouth daily.   pravastatin 20 MG tablet Commonly known as: PRAVACHOL Take 20 mg by mouth at bedtime.   rosuvastatin 20 MG tablet Commonly known as: CRESTOR Take 1 tablet (20 mg total) by mouth daily.   VITAMIN D-3 PO Take 1 capsule by mouth daily.        Follow-up Information     [facility]. Schedule an appointment as soon as possible for a visit in 1 month(s).   Specialty: Neurology Why: stroke clinic Contact information: [place] [address] [phone]               Allergies  Allergen Reactions   Bactrim [Sulfamethoxazole-Trimethoprim] Other (See Comments)    Mood swings     Consultations: Neurology   Procedures/Studies: ECHOCARDIOGRAM  COMPLETE  Result Date: 06/09/2022    ECHOCARDIOGRAM REPORT   Patient Name:   Tiffany Rice Date of Exam: 06/09/2022 Medical Rec #:  [id number]          Height:       65.0 in Accession #:    [id number]         Weight:       169.0 lb Date of Birth:  [birth year]          BSA:          1.841 m Patient Age:    52 years           BP:           136/89 mmHg Patient Gender: F                  HR:           67 bpm. Exam Location:   Inpatient Procedure: 2D Echo, Cardiac Doppler and Color Doppler Indications:    I50.40* Unspecified combined systolic (congestive) and diastolic                 (congestive) heart failure  History:        Patient has no prior history of Echocardiogram examinations.                 Stroke, Signs/Symptoms:Chest Pain; Risk Factors:Hypertension and                 Dyslipidemia.  Sonographer:    Roseanna Rainbow RDCS Referring Phys: [place]  1. Left ventricular ejection fraction, by estimation, is 65 to 70%. The left ventricle has normal function. The left ventricle has no regional wall motion abnormalities. There is mild asymmetric left ventricular hypertrophy of the basal-septal segment. Left ventricular diastolic parameters are consistent with Grade I diastolic dysfunction (impaired relaxation).  2. Right ventricular systolic function is hyperdynamic. The right ventricular size is normal. There is normal pulmonary artery systolic pressure. The estimated right ventricular systolic pressure is 123XX123 mmHg.  3. Left atrial size was moderately dilated.  4. The mitral valve is normal in structure. No evidence of mitral valve regurgitation. No evidence of mitral stenosis.  5. The aortic valve is tricuspid. There is mild calcification of the aortic valve. Aortic valve regurgitation is trivial. No aortic stenosis is present.  6. The inferior vena cava is normal in size with greater than 50% respiratory variability, suggesting right atrial pressure of 3 mmHg. FINDINGS  Left Ventricle: Left ventricular ejection fraction, by estimation, is 65 to 70%. The left ventricle has normal function. The left ventricle has no regional wall motion abnormalities. The left ventricular internal cavity size was normal in size. There is  mild asymmetric left ventricular hypertrophy of the basal-septal segment. Left ventricular diastolic parameters are consistent with Grade I diastolic dysfunction (impaired relaxation). Right  Ventricle: The right ventricular size is normal. No increase in right ventricular wall thickness. Right ventricular systolic function is hyperdynamic. There is normal pulmonary artery systolic pressure. The tricuspid regurgitant velocity is 2.10 m/s, and with an assumed right atrial pressure of 3 mmHg, the estimated right ventricular systolic pressure is 123XX123 mmHg. Left Atrium: Left atrial size was moderately dilated. Right Atrium: Right atrial size was normal in size. Pericardium: There is no evidence of pericardial effusion. Mitral Valve: The mitral valve is normal in structure. No evidence of mitral valve regurgitation. No evidence of mitral valve stenosis. Tricuspid Valve: The tricuspid valve is normal  in structure. Tricuspid valve regurgitation is mild. Aortic Valve: The aortic valve is tricuspid. There is mild calcification of the aortic valve. Aortic valve regurgitation is trivial. No aortic stenosis is present. Pulmonic Valve: The pulmonic valve was normal in structure. Pulmonic valve regurgitation is not visualized. Aorta: The aortic root is normal in size and structure. Venous: The inferior vena cava is normal in size with greater than 50% respiratory variability, suggesting right atrial pressure of 3 mmHg. IAS/Shunts: No atrial level shunt detected by color flow Doppler.  LEFT VENTRICLE PLAX 2D LVIDd:         3.50 cm     Diastology LVIDs:         2.30 cm     LV e' medial:    5.44 cm/s LV PW:         0.90 cm     LV E/e' medial:  11.9 LV IVS:        1.30 cm     LV e' lateral:   6.85 cm/s LVOT diam:     2.10 cm     LV E/e' lateral: 9.4 LV SV:         90 LV SV Index:   49 LVOT Area:     3.46 cm  LV Volumes (MOD) LV vol d, MOD A2C: 49.2 ml LV vol d, MOD A4C: 31.6 ml LV vol s, MOD A2C: 10.7 ml LV vol s, MOD A4C: 10.2 ml LV SV MOD A2C:     38.5 ml LV SV MOD A4C:     31.6 ml LV SV MOD BP:      28.8 ml RIGHT VENTRICLE             IVC RV S prime:     11.90 cm/s  IVC diam: 1.50 cm TAPSE (M-mode): 1.7 cm LEFT ATRIUM              Index        RIGHT ATRIUM           Index LA diam:        1.70 cm 0.92 cm/m   RA Area:     13.10 cm LA Vol (A2C):   24.8 ml 13.47 ml/m  RA Volume:   33.40 ml  18.14 ml/m LA Vol (A4C):   13.4 ml 7.28 ml/m LA Biplane Vol: 19.1 ml 10.37 ml/m  AORTIC VALVE LVOT Vmax:   117.00 cm/s LVOT Vmean:  81.100 cm/s LVOT VTI:    0.259 m  AORTA Ao Root diam: 2.90 cm Ao Asc diam:  3.40 cm MITRAL VALVE                TRICUSPID VALVE MV Area (PHT): 2.66 cm     TR Peak grad:   17.6 mmHg MV Decel Time: 285 msec     TR Vmax:        210.00 cm/s MV E velocity: 64.50 cm/s MV A velocity: 114.00 cm/s  SHUNTS MV E/A ratio:  0.57         Systemic VTI:  0.26 m                             Systemic Diam: 2.10 cm Dalton McleanMD Electronically signed by Franki Monte Signature Date/Time: 06/09/2022/4:56:42 PM    Final    VAS US CAROTID (at [facility] and [facility] only)  Result Date: 06/09/2022 Carotid Arterial Duplex Study Patient Name:  KANE HARMISON  Date  of Exam:   06/09/2022 Medical Rec #: [id number]           Accession #:    [id number] Date of Birth: Jun 11, 1931           Patient Gender: F Patient Age:   19 years Exam Location:  [facility] Procedure:      VAS US CAROTID Referring Phys: Domenic Polite --------------------------------------------------------------------------------  Indications:       CVA. Risk Factors:      Hypertension, hyperlipidemia. Comparison Study:  10-09-2018 Prior carotid duplex study. Performing Technologist: Darlin Coco RDMS, RVT  Examination Guidelines: A complete evaluation includes B-mode imaging, spectral Doppler, color Doppler, and power Doppler as needed of all accessible portions of each vessel. Bilateral testing is considered an integral part of a complete examination. Limited examinations for reoccurring indications may be performed as noted.  Right Carotid Findings: +----------+--------+--------+--------+------------------+--------+           PSV cm/sEDV cm/sStenosisPlaque  DescriptionComments +----------+--------+--------+--------+------------------+--------+ CCA Prox  54      12                                         +----------+--------+--------+--------+------------------+--------+ CCA Distal42      11              calcific                   +----------+--------+--------+--------+------------------+--------+ ICA Prox  30      8       1-39%   heterogenous               +----------+--------+--------+--------+------------------+--------+ ICA Distal29      9                                          +----------+--------+--------+--------+------------------+--------+ ECA       60      8                                          +----------+--------+--------+--------+------------------+--------+ +----------+--------+-------+----------------+-------------------+           PSV cm/sEDV cmsDescribe        Arm Pressure (mmHG) +----------+--------+-------+----------------+-------------------+ [id number]             Multiphasic, WNL                    +----------+--------+-------+----------------+-------------------+ +---------+--------+--+--------+-+---------+ VertebralPSV cm/s34EDV cm/s7Antegrade +---------+--------+--+--------+-+---------+  Left Carotid Findings: +----------+--------+--------+--------+------------------+--------+           PSV cm/sEDV cm/sStenosisPlaque DescriptionComments +----------+--------+--------+--------+------------------+--------+ CCA Prox  53      7                                          +----------+--------+--------+--------+------------------+--------+ CCA Distal42      8                                          +----------+--------+--------+--------+------------------+--------+ ICA Prox  26      6  1-39%   hyperechoic                +----------+--------+--------+--------+------------------+--------+ ICA Distal89      21                                          +----------+--------+--------+--------+------------------+--------+ ECA       54      9                                          +----------+--------+--------+--------+------------------+--------+ +----------+--------+--------+----------------+-------------------+           PSV cm/sEDV cm/sDescribe        Arm Pressure (mmHG) +----------+--------+--------+----------------+-------------------+ [id number]              Multiphasic, WNL                    +----------+--------+--------+----------------+-------------------+ +---------+--------+--+--------+-+---------+ VertebralPSV cm/s41EDV cm/s8Antegrade +---------+--------+--+--------+-+---------+   Summary: Right Carotid: Velocities in the right ICA are consistent with a 1-39% stenosis. Left Carotid: Velocities in the left ICA are consistent with a 1-39% stenosis. Vertebrals:  Bilateral vertebral arteries demonstrate antegrade flow. Subclavians: Normal flow hemodynamics were seen in bilateral subclavian              arteries. *See table(s) above for measurements and observations.     Preliminary    MR BRAIN WO CONTRAST  Result Date: 06/09/2022 CLINICAL DATA:  Stroke, follow-up.  Slurred speech. EXAM: MRI HEAD WITHOUT CONTRAST TECHNIQUE: Multiplanar, multiecho pulse sequences of the brain and surrounding structures were obtained without intravenous contrast. COMPARISON:  CTA head 06/08/2022.  Head CT 06/08/2022. FINDINGS: Brain: Late acute infarct in the right posterior putamen and corona radiata. No acute intracranial hemorrhage. No mass or midline shift. Unchanged moderate chronic small-vessel disease and old lacunar infarct in the left basal ganglia. Chronic microhemorrhage in the right globus pallidus. No hydrocephalus or extra-axial collection. Vascular: Redemonstrated ectasia of the right supraclinoid ICA and basilar tip aneurysm, better evaluated on previous day's CTA. Skull and upper cervical spine: Normal marrow signal.  Sinuses/Orbits: Unremarkable. Other: None. IMPRESSION: 1. Late acute infarct in the right posterior putamen and corona radiata. 2. 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 Electronically Signed   By: Emmit Alexanders M.D.   On: 06/09/2022 10:22   CT Angio Head W or Wo Contrast  Result Date: 06/08/2022 CLINICAL DATA:  cerebral aneurysm. Stroke-like symptoms. Slurred speech. EXAM: CT ANGIOGRAPHY HEAD TECHNIQUE: Multidetector CT imaging of the head was performed using the standard protocol during bolus administration of intravenous contrast. Multiplanar CT image reconstructions and MIPs were obtained to evaluate the vascular anatomy. RADIATION DOSE REDUCTION: This exam was performed according to the departmental dose-optimization program which includes automated exposure control, adjustment of the mA and/or kV according to patient size and/or use of iterative reconstruction technique. CONTRAST:  21m OMNIPAQUE IOHEXOL 350 MG/ML SOLN COMPARISON:  Head CT earlier same day. FINDINGS: CTA HEAD Anterior circulation: Both internal carotid arteries are patent through the skull base and siphon regions. There is atherosclerotic ectasia of the carotid siphons, right more than left. Aneurysmal dilatation with vascular diameter extending up to 8.4 mm on the right and 6.7 mm on the left. No focal berry aneurysm in this region. The  anterior and middle cerebral vessels are patent. No large vessel occlusion or proximal stenosis. Posterior circulation: Both vertebral arteries are patent through the foramen magnum to the basilar artery. The left vertebral artery is dominant. No basilar stenosis. Posterior circulation branch vessels are patent. There is a wide mouth aneurysm at the basilar tip measuring up to 7 mm in diameter. Mouth of the aneurysm measures 6 mm. Venous sinuses: Patent and normal. Anatomic variants: None significant. Review of the MIP images confirms the  above findings. IMPRESSION: 1. No intracranial large vessel occlusion or proximal stenosis. 2. 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 Mouth of the aneurysm measures 6 mm. 3. Atherosclerotic ectasia of the carotid siphons, right more than left. Aneurysmal dilatation with vascular diameter extending up to 8.4 mm on the right and 6.7 mm on the left. No focal berry aneurysm in this region. Electronically Signed   By: Nelson Chimes M.D.   On: 06/08/2022 15:41   CT Head Wo Contrast  Result Date: 06/08/2022 CLINICAL DATA:  Neuro deficit, acute, stroke suspected EXAM: CT HEAD WITHOUT CONTRAST TECHNIQUE: Contiguous axial images were obtained from the base of the skull through the vertex without intravenous contrast. RADIATION DOSE REDUCTION: This exam was performed according to the departmental dose-optimization program which includes automated exposure control, adjustment of the mA and/or kV according to patient size and/or use of iterative reconstruction technique. COMPARISON:  None Available. FINDINGS: Brain: No evidence of acute large vascular territory infarction, hemorrhage, hydrocephalus, extra-axial collection or mass lesion/mass effect. Patchy white matter hypodensities, nonspecific but compatible with chronic microvascular ischemic disease. Remote perforator infarct in the bilateral basal ganglia. Remote left cerebellar infarct. Vascular: Suspected aneurysm of the basilar tip. No hyperdense vessel identified. Calcific atherosclerosis. Skull: No acute fracture. Sinuses/Orbits: Clear sinuses.  No acute orbital findings. Other: No mastoid effusions. IMPRESSION: 1. Suspected aneurysm of the basilar tip. Recommend CTA to confirm and further evaluate. 2. Otherwise, no evidence of acute intracranial abnormality. Chronic microvascular ischemic disease and remote appearing infarcts. MRI could better assess for acute infarct if clinically warranted. Electronically Signed   By: Margaretha Sheffield M.D.   On: 06/08/2022  15:24   (Echo, Carotid, EGD, Colonoscopy, ERCP)    Subjective: No complaints  Discharge Exam: Vitals:   06/11/22 0327 06/11/22 0725  BP: 123/84 (!) 148/76  Pulse: 73 63  Resp: 17 16  Temp: 97.9 F (36.6 C) 98.4 F (36.9 C)  SpO2: 96% 96%   Vitals:   06/10/22 1928 06/10/22 2304 06/11/22 0327 06/11/22 0725  BP: (!) 148/82 139/70 123/84 (!) 148/76  Pulse: 72 69 73 63  Resp:  '16 17 16  '$ Temp: 97.7 F (36.5 C) 97.9 F (36.6 C) 97.9 F (36.6 C) 98.4 F (36.9 C)  TempSrc: Oral Oral Oral Oral  SpO2: 94% 93% 96% 96%  Weight:      Height:        General: Pt is alert, awake, not in acute distress Cardiovascular: RRR, S1/S2 +, no rubs, no gallops Respiratory: CTA bilaterally, no wheezing, no rhonchi Abdominal: Soft, NT, ND, bowel sounds + Extremities: no edema, no cyanosis    The results of significant diagnostics from this hospitalization (including imaging, microbiology, ancillary and laboratory) are listed below for reference.     Microbiology: Recent Results (from the past 240 hour(s))  MRSA Next Gen by PCR, Nasal     Status: None   Collection Time: 06/08/22  7:43 PM   Specimen: Nasal Mucosa; Nasal Swab  Result Value  Ref Range Status   MRSA by PCR Next Gen NOT DETECTED NOT DETECTED Final    Comment: (NOTE) The GeneXpert MRSA Assay (FDA approved for NASAL specimens only), is one component of a comprehensive MRSA colonization surveillance program. It is not intended to diagnose MRSA infection nor to guide or monitor treatment for MRSA infections. Test performance is not FDA approved in patients less than 58 years old. Performed at [facility], [place] [address] [place], [place] [address]      Labs: BNP (last 3 results) No results for input(s): "BNP" in the last 8760 hours. Basic Metabolic Panel: Recent Labs  Lab 06/08/22 1536 06/08/22 1559 06/08/22 1957  NA 138 141  --   K 3.4* 3.9  --   CL 105 105  --   CO2 26  --   --   GLUCOSE 99 89  --   BUN  8 8  --   CREATININE 0.83 0.60 0.74  CALCIUM 9.5  --   --    Liver Function Tests: No results for input(s): "AST", "ALT", "ALKPHOS", "BILITOT", "PROT", "ALBUMIN" in the last 168 hours. No results for input(s): "LIPASE", "AMYLASE" in the last 168 hours. No results for input(s): "AMMONIA" in the last 168 hours. CBC: Recent Labs  Lab 06/08/22 1536 06/08/22 1559 06/08/22 1957  WBC 5.3  --  5.1  HGB 13.7 14.3 16.0*  HCT 41.8 42.0 45.7  MCV 89.5  --  86.1  PLT 198  --  209   Cardiac Enzymes: No results for input(s): "CKTOTAL", "CKMB", "CKMBINDEX", "TROPONINI" in the last 168 hours. BNP: Invalid input(s): "POCBNP" CBG: Recent Labs  Lab 06/08/22 1322  GLUCAP 141*   D-Dimer No results for input(s): "DDIMER" in the last 72 hours. Hgb A1c Recent Labs    06/08/22 1957  HGBA1C 5.6   Lipid Profile Recent Labs    06/09/22 0442  CHOL 206*  HDL 48  LDLCALC 141*  TRIG 84  CHOLHDL 4.3   Thyroid function studies No results for input(s): "TSH", "T4TOTAL", "T3FREE", "THYROIDAB" in the last 72 hours.  Invalid input(s): "FREET3" Anemia work up No results for input(s): "VITAMINB12", "FOLATE", "FERRITIN", "TIBC", "IRON", "RETICCTPCT" in the last 72 hours. Urinalysis No results found for: "COLORURINE", "APPEARANCEUR", "LABSPEC", "PHURINE", "GLUCOSEU", "HGBUR", "BILIRUBINUR", "KETONESUR", "PROTEINUR", "UROBILINOGEN", "NITRITE", "LEUKOCYTESUR" Sepsis Labs Recent Labs  Lab 06/08/22 1536 06/08/22 1957  WBC 5.3 5.1   Microbiology Recent Results (from the past 240 hour(s))  MRSA Next Gen by PCR, Nasal     Status: None   Collection Time: 06/08/22  7:43 PM   Specimen: Nasal Mucosa; Nasal Swab  Result Value Ref Range Status   MRSA by PCR Next Gen NOT DETECTED NOT DETECTED Final    Comment: (NOTE) The GeneXpert MRSA Assay (FDA approved for NASAL specimens only), is one component of a comprehensive MRSA colonization surveillance program. It is not intended to diagnose MRSA  infection nor to guide or monitor treatment for MRSA infections. Test performance is not FDA approved in patients less than 67 years old. Performed at [facility], [place] [address] [place], [place] [address]     SIGNED:   Charlynne Cousins, MD  [facility] 06/11/2022, 7:52 AM Pager   If 7PM-7AM, please contact night-coverage [email or web address] Password [email or web address]

## 2022-06-11 NOTE — Plan of Care (Signed)
Problem: Education: Goal: Knowledge of disease or condition will improve 06/11/2022 1009 by Charlestine Night, RN Outcome: Adequate for Discharge 06/11/2022 1009 by Charlestine Night, RN Outcome: Adequate for Discharge Goal: Knowledge of secondary prevention will improve (MUST DOCUMENT ALL) 06/11/2022 1009 by Charlestine Night, RN Outcome: Adequate for Discharge 06/11/2022 1009 by Charlestine Night, RN Outcome: Adequate for Discharge Goal: Knowledge of patient specific risk factors will improve Tiffany Rice N/A or DELETE if not current risk factor) 06/11/2022 1009 by Charlestine Night, RN Outcome: Adequate for Discharge 06/11/2022 1009 by Charlestine Night, RN Outcome: Adequate for Discharge   Problem: Ischemic Stroke/TIA Tissue Perfusion: Goal: Complications of ischemic stroke/TIA will be minimized 06/11/2022 1009 by Charlestine Night, RN Outcome: Adequate for Discharge 06/11/2022 1009 by Charlestine Night, RN Outcome: Adequate for Discharge   Problem: Coping: Goal: Will verbalize positive feelings about self 06/11/2022 1009 by Charlestine Night, RN Outcome: Adequate for Discharge 06/11/2022 1009 by Charlestine Night, RN Outcome: Adequate for Discharge Goal: Will identify appropriate support needs 06/11/2022 1009 by Charlestine Night, RN Outcome: Adequate for Discharge 06/11/2022 1009 by Charlestine Night, RN Outcome: Adequate for Discharge   Problem: Health Behavior/Discharge Planning: Goal: Ability to manage health-related needs will improve 06/11/2022 1009 by Charlestine Night, RN Outcome: Adequate for Discharge 06/11/2022 1009 by Charlestine Night, RN Outcome: Adequate for Discharge Goal: Goals will be collaboratively established with patient/family 06/11/2022 1009 by Charlestine Night, RN Outcome: Adequate for Discharge 06/11/2022 1009 by Charlestine Night, RN Outcome: Adequate for Discharge   Problem: Self-Care: Goal: Ability to participate in self-care as condition permits will improve 06/11/2022 1009 by Charlestine Night,  RN Outcome: Adequate for Discharge 06/11/2022 1009 by Charlestine Night, RN Outcome: Adequate for Discharge Goal: Verbalization of feelings and concerns over difficulty with self-care will improve 06/11/2022 1009 by Charlestine Night, RN Outcome: Adequate for Discharge 06/11/2022 1009 by Charlestine Night, RN Outcome: Adequate for Discharge Goal: Ability to communicate needs accurately will improve 06/11/2022 1009 by Charlestine Night, RN Outcome: Adequate for Discharge 06/11/2022 1009 by Charlestine Night, RN Outcome: Adequate for Discharge   Problem: Nutrition: Goal: Risk of aspiration will decrease 06/11/2022 1009 by Charlestine Night, RN Outcome: Adequate for Discharge 06/11/2022 1009 by Charlestine Night, RN Outcome: Adequate for Discharge Goal: Dietary intake will improve 06/11/2022 1009 by Charlestine Night, RN Outcome: Adequate for Discharge 06/11/2022 1009 by Charlestine Night, RN Outcome: Adequate for Discharge   Problem: Education: Goal: Knowledge of General Education information will improve Description: Including pain rating scale, medication(s)/side effects and non-pharmacologic comfort measures 06/11/2022 1009 by Charlestine Night, RN Outcome: Adequate for Discharge 06/11/2022 1009 by Charlestine Night, RN Outcome: Adequate for Discharge   Problem: Health Behavior/Discharge Planning: Goal: Ability to manage health-related needs will improve 06/11/2022 1009 by Charlestine Night, RN Outcome: Adequate for Discharge 06/11/2022 1009 by Charlestine Night, RN Outcome: Adequate for Discharge   Problem: Clinical Measurements: Goal: Ability to maintain clinical measurements within normal limits will improve 06/11/2022 1009 by Charlestine Night, RN Outcome: Adequate for Discharge 06/11/2022 1009 by Charlestine Night, RN Outcome: Adequate for Discharge Goal: Will remain free from infection 06/11/2022 1009 by Charlestine Night, RN Outcome: Adequate for Discharge 06/11/2022 1009 by Charlestine Night, RN Outcome: Adequate for  Discharge Goal: Diagnostic test results will improve 06/11/2022 1009 by Charlestine Night, RN Outcome: Adequate for Discharge 06/11/2022 1009 by Charlestine Night, RN Outcome: Adequate for Discharge Goal: Respiratory complications will improve 06/11/2022 1009 by Charlestine Night, RN Outcome: Adequate for Discharge 06/11/2022 1009 by Charlestine Night, RN Outcome: Adequate for Discharge Goal: Cardiovascular complication will be avoided  06/11/2022 1009 by Charlestine Night, RN Outcome: Adequate for Discharge 06/11/2022 1009 by Charlestine Night, RN Outcome: Adequate for Discharge   Problem: Activity: Goal: Risk for activity intolerance will decrease 06/11/2022 1009 by Charlestine Night, RN Outcome: Adequate for Discharge 06/11/2022 1009 by Charlestine Night, RN Outcome: Adequate for Discharge   Problem: Nutrition: Goal: Adequate nutrition will be maintained 06/11/2022 1009 by Charlestine Night, RN Outcome: Adequate for Discharge 06/11/2022 1009 by Charlestine Night, RN Outcome: Adequate for Discharge   Problem: Coping: Goal: Level of anxiety will decrease 06/11/2022 1009 by Charlestine Night, RN Outcome: Adequate for Discharge 06/11/2022 1009 by Charlestine Night, RN Outcome: Adequate for Discharge   Problem: Elimination: Goal: Will not experience complications related to bowel motility 06/11/2022 1009 by Charlestine Night, RN Outcome: Adequate for Discharge 06/11/2022 1009 by Charlestine Night, RN Outcome: Adequate for Discharge Goal: Will not experience complications related to urinary retention 06/11/2022 1009 by Charlestine Night, RN Outcome: Adequate for Discharge 06/11/2022 1009 by Charlestine Night, RN Outcome: Adequate for Discharge   Problem: Pain Managment: Goal: General experience of comfort will improve 06/11/2022 1009 by Charlestine Night, RN Outcome: Adequate for Discharge 06/11/2022 1009 by Charlestine Night, RN Outcome: Adequate for Discharge   Problem: Safety: Goal: Ability to remain free from injury will  improve 06/11/2022 1009 by Charlestine Night, RN Outcome: Adequate for Discharge 06/11/2022 1009 by Charlestine Night, RN Outcome: Adequate for Discharge   Problem: Skin Integrity: Goal: Risk for impaired skin integrity will decrease 06/11/2022 1009 by Charlestine Night, RN Outcome: Adequate for Discharge 06/11/2022 1009 by Charlestine Night, RN Outcome: Adequate for Discharge

## 2022-06-11 NOTE — NC FL2 (Signed)
[place] LEVEL OF CARE FORM     IDENTIFICATION  Patient Name: Tiffany Rice Birthdate: [birth year] Sex: female Admission Date (Current Location): 06/08/2022  [facility] and [place] Number:  Herbalist and Address:  [facility] [place] [address], [place], [place] [address]      Provider Number: [id number]  Attending Physician Name and Address:  Charlynne Cousins, MD  Relative Name and Phone Number:       Current Level of Care: Hospital Recommended Level of Care: [place] Prior Approval Number:    Date Approved/Denied:   PASRR Number: [id number]  Discharge Plan: SNF    Current Diagnoses: Patient Active Problem List   Diagnosis Date Noted   Slurred speech 06/08/2022   Hypertensive emergency 06/08/2022   CVA (cerebral vascular accident) [facility]) 06/08/2022   Cyst of left ovary 03/04/2017   Essential hypertension 02/15/2013   Hyperlipidemia 02/15/2013   Hypothyroidism 02/15/2013   CKD (chronic kidney disease) stage 2, GFR 60-89 ml/min 02/15/2013   Chest pain on exertion 02/15/2013    Orientation RESPIRATION BLADDER Height & Weight     Self, Time, Situation, Place  Normal Continent Weight: 76.7 kg Height:  '5\' 5"'$  (165.1 cm)  BEHAVIORAL SYMPTOMS/MOOD NEUROLOGICAL BOWEL NUTRITION STATUS      Continent Diet (dysphagia 3 with thin liquids)  AMBULATORY STATUS COMMUNICATION OF NEEDS Skin   Limited Assist Verbally Normal                       Personal Care Assistance Level of Assistance  Bathing, Feeding, Dressing Bathing Assistance: Limited assistance Feeding assistance: Independent Dressing Assistance: Limited assistance     Functional Limitations Info  Sight, Hearing, Speech Sight Info: Adequate Hearing Info: Adequate Speech Info: Impaired (slur)    SPECIAL CARE FACTORS FREQUENCY  PT (By licensed PT), OT (By licensed OT), Speech therapy     PT Frequency: 5x/wk OT Frequency: 5x/wk      Speech Therapy Frequency: 5x/wk      Contractures Contractures Info: Not present    Additional Factors Info  Code Status, Allergies Code Status Info: DNR Allergies Info: Bactrim           Current Medications (06/11/2022):  This is the current hospital active medication list Current Facility-Administered Medications  Medication Dose Route Frequency Provider Last Rate Last Admin   0.9 %  sodium chloride infusion   Intravenous Continuous Charlynne Cousins, MD       acetaminophen (TYLENOL) tablet 650 mg  650 mg Oral Q4H PRN Charlynne Cousins, MD       Or   acetaminophen (TYLENOL) 160 MG/5ML solution 650 mg  650 mg Per Tube Q4H PRN Charlynne Cousins, MD       Or   acetaminophen (TYLENOL) suppository 650 mg  650 mg Rectal Q4H PRN Charlynne Cousins, MD       aspirin EC tablet 81 mg  81 mg Oral Daily Charlynne Cousins, MD   81 mg at 06/11/22 1100   carvedilol (COREG) tablet 6.25 mg  6.25 mg Oral BID WC Charlynne Cousins, MD   6.25 mg at 06/11/22 0845   Chlorhexidine Gluconate Cloth 2 % PADS 6 each  6 each Topical Daily Charlynne Cousins, MD   6 each at 06/11/22 1108   clopidogrel (PLAVIX) tablet 75 mg  75 mg Oral Daily Charlynne Cousins, MD   75 mg at 06/11/22 1100   enoxaparin (  LOVENOX) injection 40 mg  40 mg Subcutaneous Q24H Charlynne Cousins, MD   40 mg at 06/10/22 1954   irbesartan (AVAPRO) tablet 300 mg  300 mg Oral Daily Rosalin Hawking, MD   300 mg at 06/11/22 1100   levothyroxine (SYNTHROID) tablet 88 mcg  88 mcg Oral Q0600 Charlynne Cousins, MD   88 mcg at 06/11/22 [id number]   LORazepam (ATIVAN) injection 1 mg  1 mg Intravenous PRN Charlynne Cousins, MD   1 mg at 06/09/22 0932   rosuvastatin (CRESTOR) tablet 20 mg  20 mg Oral Daily Charlynne Cousins, MD   20 mg at 06/11/22 1100   senna-docusate (Senokot-S) tablet 1 tablet  1 tablet Oral QHS PRN Charlynne Cousins, MD         Discharge Medications: Please see discharge summary for a list of  discharge medications.  Relevant Imaging Results:  Relevant Lab Results:   Additional Information SS#: [id number]  Pollie Friar, RN

## 2022-06-11 NOTE — Plan of Care (Signed)

## 2022-06-11 NOTE — TOC Transition Note (Addendum)
Transition of Care [facility]) - CM/SW Discharge Note   Patient Details  Name: Tiffany Rice MRN: [id number] Date of Birth: 1931-08-25  [facility] of [facility]) CM/SW Contact:  Pollie Friar, RN Phone Number: 06/11/2022, 8:40 AM   Clinical Narrative:    Pt is discharging home with home health services through [facility] home health. Information on the AVS. The [facility] agency will contact the pt for the first appointment.  Walker for home ordered through [facility] and will be delivered to the room. Pt lives alone but says her children are currently in town. They will provide transportation home today.  Pt normally drives self as needed. She manages her own medications and denies any issues. [facility] [address]: Family doesn't feel she is capable of living at home independently. Family unable to provide supervision/ assistance at home. CM asked PT to see early and they feel SNF is the best option. CM has faxed pt out with her and family permission. They were provided bed offers and selected [facility] will have a bed for her tomorrow. Its a semi-private and she is on the list for a private on Tuesday. Family agrees. CM has asked the [facility] to begin insurance auth through [place].  TOC following.  Final next level of care: Home w Home Health Services Barriers to Discharge: No Barriers Identified   Patient Goals and CMS Choice CMS Medicare.gov Compare Post Acute Care list provided to:: Patient Choice offered to / list presented to : Patient  Discharge Placement                         Discharge Plan and Services Additional resources added to the After Visit Summary for                  DME Arranged: Walker rolling DME Agency: [employer] Date DME Agency Contacted: 06/11/22 Time DME Agency Contacted: [phone] Representative spoke with at DME Agency: Erasmo Downer HH Arranged: PT, OT, Speech Therapy HH Agency: [place] Date [place]: 06/11/22   Representative spoke with at [place]: [place] ([place]) Interventions SDOH Screenings   Food Insecurity: No Food Insecurity (06/10/2022)  Housing: Low Risk  (06/10/2022)  Transportation Needs: No Transportation Needs (06/10/2022)  Utilities: Not At Risk (06/10/2022)  Tobacco Use: Medium Risk (06/10/2022)     Readmission Risk Interventions     No data to display

## 2022-06-11 NOTE — Progress Notes (Signed)
Physical Therapy Treatment Patient Details Name: Tiffany Rice MRN: [id number] DOB: [birth year] Today's Date: 06/11/2022   History of Present Illness Pt is an 89yo female who was brough to ED with generalized weakness and impaired speech. 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 PMH: HTN, dyslipidemia, & hypothyroidism    PT Comments    Pt required supervision bed mobility, min guard assist transfers, and min assist ambulation 150' with RW. Pt continually drifting L during amb, requiring assist to maintain linear path and avoid obstacles on L side. Pt's children present during session. They all live out of town and are currently in a hotel for a short term visit. No family is available to stay with pt and provide 24-hour assist. Discharge recommendation, therefore, update to [facility].  Spoke with family at length regarding what rehab would entail and expected outcomes. All questions answered. PT to continue to follow in acute care.    Recommendations for follow up therapy are one component of a multi-disciplinary discharge planning process, led by the attending physician.  Recommendations may be updated based on patient status, additional functional criteria and insurance authorization.  Follow Up Recommendations  Skilled nursing-short term rehab (<3 hours/day) Can patient physically be transported by private vehicle: Yes   Assistance Recommended at Discharge Frequent or constant Supervision/Assistance  Patient can return home with the following A little help with walking and/or transfers;A little help with bathing/dressing/bathroom;Assist for transportation;Help with stairs or ramp for entrance   Equipment Recommendations  Rolling walker (2 wheels)    Recommendations for Other Services       Precautions / Restrictions Precautions Precautions: Fall     Mobility  Bed Mobility Overal bed mobility: Needs Assistance Bed Mobility: Supine to Sit     Supine to sit: Supervision, HOB  elevated     General bed mobility comments: +rail, supervision for safety    Transfers Overall transfer level: Needs assistance Equipment used: Rolling walker (2 wheels) Transfers: Sit to/from Stand Sit to Stand: Min guard           General transfer comment: cues for hand placement    Ambulation/Gait Ambulation/Gait assistance: Min assist Gait Distance (Feet): 150 Feet Assistive device: Rolling walker (2 wheels) Gait Pattern/deviations: Step-through pattern, Decreased stride length, Staggering left, Drifts right/left, Trunk flexed Gait velocity: decreased Gait velocity interpretation: 1.31 - 2.62 ft/sec, indicative of limited community ambulator   General Gait Details: Continually drifting L. Assist to maintain linear path and avoid obstacles on L.   Stairs             Wheelchair Mobility    Modified Rankin (Stroke Patients Only) Modified Rankin (Stroke Patients Only) Pre-Morbid Rankin Score: No significant disability Modified Rankin: Moderately severe disability     Balance Overall balance assessment: Needs assistance Sitting-balance support: Feet supported, No upper extremity supported Sitting balance-Leahy Scale: Good     Standing balance support: Bilateral upper extremity supported, During functional activity, Reliant on assistive device for balance Standing balance-Leahy Scale: Poor                              Cognition Arousal/Alertness: Awake/alert Behavior During Therapy: WFL for tasks assessed/performed Overall Cognitive Status: Impaired/Different from baseline Area of Impairment: Safety/judgement                         Safety/Judgement: Decreased awareness of safety, Decreased awareness of deficits  Exercises      General Comments        Pertinent Vitals/Pain Pain Assessment Pain Assessment: No/denies pain    Home Living                          Prior Function             PT Goals (current goals can now be found in the care plan section) Acute Rehab PT Goals Patient Stated Goal: home Progress towards PT goals: Progressing toward goals    Frequency    Min 4X/week      PT Plan Discharge plan needs to be updated    Co-evaluation              AM-PAC PT "6 Clicks" Mobility   Outcome Measure  Help needed turning from your back to your side while in a flat bed without using bedrails?: A Little Help needed moving from lying on your back to sitting on the side of a flat bed without using bedrails?: A Little Help needed moving to and from a bed to a chair (including a wheelchair)?: A Little Help needed standing up from a chair using your arms (e.g., wheelchair or bedside chair)?: A Little Help needed to walk in hospital room?: A Little Help needed climbing 3-5 steps with a railing? : A Lot 6 Click Score: 17    End of Session Equipment Utilized During Treatment: Gait belt Activity Tolerance: Patient tolerated treatment well Patient left: in chair;with call bell/phone within reach;with chair alarm set;with family/visitor present Nurse Communication: Mobility status PT Visit Diagnosis: Unsteadiness on feet (R26.81);Muscle weakness (generalized) (M62.81);Difficulty in walking, not elsewhere classified (R26.2)     Time: [id number] PT Time Calculation (min) (ACUTE ONLY): 41 min  Charges:  $Gait Training: 23-37 mins $Therapeutic Activity: 8-22 mins                     Gloriann Loan., PT  Office # [phone]    Lorriane Shire 06/11/2022, 12:04 PM

## 2022-06-11 NOTE — Care Management Important Message (Signed)
Important Message  Patient Details  Name: Tiffany Rice MRN: [id number] Date of Birth: October 12, 1931   Medicare Important Message Given:  Yes     Hannah Beat 06/11/2022, 12:27 PM

## 2022-06-11 NOTE — Plan of Care (Signed)
  Problem: Education: Goal: Knowledge of secondary prevention will improve (MUST DOCUMENT ALL) Outcome: Progressing   Problem: Coping: Goal: Will verbalize positive feelings about self Outcome: Progressing Goal: Will identify appropriate support needs Outcome: Progressing   Problem: Self-Care: Goal: Ability to participate in self-care as condition permits will improve Outcome: Progressing Goal: Ability to communicate needs accurately will improve Outcome: Progressing   Problem: Nutrition: Goal: Risk of aspiration will decrease Outcome: Progressing

## 2022-06-12 NOTE — Progress Notes (Signed)
[employer] auth remains pending for [facility] this morning. Will continue to check status and provide updates as available.   Wandra Feinstein, MSW, LCSW [phone] (coverage)

## 2022-06-12 NOTE — Plan of Care (Signed)
  Problem: Education: Goal: Knowledge of secondary prevention will improve (MUST DOCUMENT ALL) Outcome: Progressing Goal: Knowledge of patient specific risk factors will improve Tiffany Rice N/A or DELETE if not current risk factor) Outcome: Progressing   Problem: Ischemic Stroke/TIA Tissue Perfusion: Goal: Complications of ischemic stroke/TIA will be minimized Outcome: Progressing   Problem: Coping: Goal: Will verbalize positive feelings about self Outcome: Progressing

## 2022-06-12 NOTE — Progress Notes (Signed)
[facility] PROGRESS NOTE    Progress Note  Tiffany Rice  [id number] DOB: 1932/02/18 DOA: 06/08/2022 PCP: Haywood Pao, MD     Brief Narrative:   Tiffany Rice is an 89 y.o. female past medical history of essential hypertension, hyperlipidemia brought into the by family for slurred speech that started about 5 days prior to admission, MRI of the brain showed late acute infarct of the posterior putamen and corona radiator  Assessment/Plan:   Acute lacunar infarct/hypertensive emergency: Initially required nicardipine which is now has been weaned off. Neurology was consulted recommended aspirin and Plavix for 3 weeks and then aspirin alone. PT evaluated the patient, will need skilled nursing facility placement. 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  Dyslipidemia: Increased trends.  Hypothyroidism:  Continue Synthroid.   DVT prophylaxis: lovenox Family Communication:none Status is: Inpatient Remains inpatient appropriate because: Acute CVA    Code Status:     Code Status Orders  (From admission, onward)           Start     Ordered   06/08/22 1659  Do not attempt resuscitation (DNR)  Continuous       Question Answer Comment  If patient has no pulse and is not breathing Do Not Attempt Resuscitation   If patient has a pulse and/or is breathing: Medical Treatment Goals LIMITED ADDITIONAL INTERVENTIONS: Use medication/IV fluids and cardiac monitoring as indicated; Do not use intubation or mechanical ventilation (DNI), also provide comfort medications.  Transfer to Progressive/Stepdown as indicated, avoid Intensive Care.   Consent: Discussion documented in EHR or advanced directives reviewed      06/08/22 1700           Code Status History     This patient has a current code status but no historical code status.      Advance Directive Documentation    Flowsheet Row Most Recent Value  Type of Advance Directive  Healthcare Power of Attorney, Living will  Pre-existing out of facility DNR order (yellow form or pink MOST form) --  "MOST" Form in Place? --         IV Access:   Peripheral IV   Procedures and diagnostic studies:   No results found.   Medical Consultants:   None.   Subjective:    Tiffany Rice no complains  Objective:    Vitals:   06/11/22 1932 06/11/22 2312 06/12/22 0330 06/12/22 0716  BP: 125/65 126/66 (!) 107/59 (!) 140/84  Pulse: 72 68 70 80  Resp: '20 20 20 20  '$ Temp: 97.8 F (36.6 C) 98.1 F (36.7 C) 98 F (36.7 C) 99.1 F (37.3 C)  TempSrc: Oral Oral Oral Oral  SpO2: 96% 97% 94% 97%  Weight:      Height:       SpO2: 97 % O2 Flow Rate (L/min): 2 L/min  No intake or output data in the 24 hours ending 06/12/22 0938  Filed Weights   06/08/22 1320  Weight: 76.7 kg    Exam: General exam: In no acute distress. Respiratory system: Good air movement and clear to auscultation. Cardiovascular system: S1 & S2 heard, RRR. No JVD. Gastrointestinal system: Abdomen is nondistended, soft and nontender.  Central nervous system: Alert and oriented. No focal neurological deficits. Extremities: No pedal edema. Skin: No rashes, lesions or ulcers Psychiatry: Judgement and insight appear normal. Mood & affect appropriate.    Data Reviewed:    Labs: Basic Metabolic Panel: Recent Labs  Lab 06/08/22 1536 06/08/22 1559 06/08/22 1957  NA 138 141  --   K 3.4* 3.9  --   CL 105 105  --   CO2 26  --   --   GLUCOSE 99 89  --   BUN 8 8  --   CREATININE 0.83 0.60 0.74  CALCIUM 9.5  --   --     GFR Estimated Creatinine Clearance: 49.8 mL/min (by C-G formula based on SCr of 0.74 mg/dL). Liver Function Tests: No results for input(s): "AST", "ALT", "ALKPHOS", "BILITOT", "PROT", "ALBUMIN" in the last 168 hours. No results for input(s): "LIPASE", "AMYLASE" in the last 168 hours. No results for input(s): "AMMONIA" in the last 168 hours. Coagulation  profile No results for input(s): "INR", "PROTIME" in the last 168 hours. COVID-19 Labs  No results for input(s): "DDIMER", "FERRITIN", "LDH", "CRP" in the last 72 hours.  No results found for: "SARSCOV2NAA"  CBC: Recent Labs  Lab 06/08/22 1536 06/08/22 1559 06/08/22 1957  WBC 5.3  --  5.1  HGB 13.7 14.3 16.0*  HCT 41.8 42.0 45.7  MCV 89.5  --  86.1  PLT 198  --  209    Cardiac Enzymes: No results for input(s): "CKTOTAL", "CKMB", "CKMBINDEX", "TROPONINI" in the last 168 hours. BNP (last 3 results) No results for input(s): "PROBNP" in the last 8760 hours. CBG: Recent Labs  Lab 06/08/22 1322  GLUCAP 141*    D-Dimer: No results for input(s): "DDIMER" in the last 72 hours. Hgb A1c: No results for input(s): "HGBA1C" in the last 72 hours.  Lipid Profile: No results for input(s): "CHOL", "HDL", "LDLCALC", "TRIG", "CHOLHDL", "LDLDIRECT" in the last 72 hours.  Thyroid function studies: No results for input(s): "TSH", "T4TOTAL", "T3FREE", "THYROIDAB" in the last 72 hours.  Invalid input(s): "FREET3" Anemia work up: No results for input(s): "VITAMINB12", "FOLATE", "FERRITIN", "TIBC", "IRON", "RETICCTPCT" in the last 72 hours. Sepsis Labs: Recent Labs  Lab 06/08/22 1536 06/08/22 1957  WBC 5.3 5.1    Microbiology Recent Results (from the past 240 hour(s))  MRSA Next Gen by PCR, Nasal     Status: None   Collection Time: 06/08/22  7:43 PM   Specimen: Nasal Mucosa; Nasal Swab  Result Value Ref Range Status   MRSA by PCR Next Gen NOT DETECTED NOT DETECTED Final    Comment: (NOTE) The GeneXpert MRSA Assay (FDA approved for NASAL specimens only), is one component of a comprehensive MRSA colonization surveillance program. It is not intended to diagnose MRSA infection nor to guide or monitor treatment for MRSA infections. Test performance is not FDA approved in patients less than 81 years old. Performed at [facility], [place] [address] [place], [place] [address]       Medications:    aspirin EC  81 mg Oral Daily   carvedilol  6.25 mg Oral BID WC   Chlorhexidine Gluconate Cloth  6 each Topical Daily   clopidogrel  75 mg Oral Daily   enoxaparin (LOVENOX) injection  40 mg Subcutaneous Q24H   irbesartan  300 mg Oral Daily   levothyroxine  88 mcg Oral Q0600   rosuvastatin  20 mg Oral Daily   Continuous Infusions:  sodium chloride        LOS: 4 days   Charlynne Cousins  [facility]  06/12/2022, 9:38 AM

## 2022-06-12 NOTE — Progress Notes (Addendum)
Physical Therapy Treatment Patient Details Name: Tiffany Rice MRN: [id number] DOB: Feb 23, 1932 Today's Date: 06/12/2022   History of Present Illness Pt is an 89yo female who was brough to ED with generalized weakness and impaired speech. 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 PMH: HTN, dyslipidemia, & hypothyroidism    PT Comments    Pt progressing towards her physical therapy goals and remains motivated to participate. Session focused on progressive ambulation, standing balance and functional strengthening. Pt requiring min assist overall for functional mobility. Gait speed of 1.94 ft/s indicative that pt is at increased risk for falls. Continues to drift towards left and needs cueing to prevent running into obstacles. Continue to recommend SNF for ongoing Physical Therapy.      Recommendations for follow up therapy are one component of a multi-disciplinary discharge planning process, led by the attending physician.  Recommendations may be updated based on patient status, additional functional criteria and insurance authorization.  Follow Up Recommendations  Skilled nursing-short term rehab (<3 hours/day) Can patient physically be transported by private vehicle: Yes   Assistance Recommended at Discharge Frequent or constant Supervision/Assistance  Patient can return home with the following A little help with walking and/or transfers;A little help with bathing/dressing/bathroom;Assist for transportation;Help with stairs or ramp for entrance   Equipment Recommendations  Rolling walker (2 wheels)    Recommendations for Other Services       Precautions / Restrictions Precautions Precautions: Fall Restrictions Weight Bearing Restrictions: No     Mobility  Bed Mobility Overal bed mobility: Needs Assistance Bed Mobility: Supine to Sit     Supine to sit: Supervision, HOB elevated          Transfers Overall transfer level: Needs assistance Equipment used: Rolling walker (2  wheels), None Transfers: Sit to/from Stand Sit to Stand: Min assist           General transfer comment: cues for hand placement, light minA to rise and steady    Ambulation/Gait Ambulation/Gait assistance: Min assist Gait Distance (Feet): 400 Feet Assistive device: Rolling walker (2 wheels) Gait Pattern/deviations: Step-through pattern, Decreased stride length, Staggering left, Drifts right/left, Trunk flexed Gait velocity: decreased     General Gait Details: Continually drifting L. Assist to maintain linear path and avoid obstacles on L. Use of visual targets to walk in center of hallway.   Stairs             Wheelchair Mobility    Modified Rankin (Stroke Patients Only) Modified Rankin (Stroke Patients Only) Pre-Morbid Rankin Score: No significant disability Modified Rankin: Moderately severe disability     Balance Overall balance assessment: Needs assistance Sitting-balance support: Feet supported, No upper extremity supported Sitting balance-Leahy Scale: Good     Standing balance support: Bilateral upper extremity supported, During functional activity, Reliant on assistive device for balance Standing balance-Leahy Scale: Poor Standing balance comment: reliant on external support, pt leaned against sink to wash hands                            Cognition Arousal/Alertness: Awake/alert Behavior During Therapy: WFL for tasks assessed/performed Overall Cognitive Status: Impaired/Different from baseline Area of Impairment: Safety/judgement                         Safety/Judgement: Decreased awareness of safety, Decreased awareness of deficits              Exercises Other Exercises Other Exercises: x5 serial  sit to stands for functional strengthening    General Comments        Pertinent Vitals/Pain      Home Living                          Prior Function            PT Goals (current goals can now be found in  the care plan section) Acute Rehab PT Goals Potential to Achieve Goals: Good Progress towards PT goals: Progressing toward goals    Frequency    Min 3X/week      PT Plan Frequency needs to be updated    Co-evaluation              AM-PAC PT "6 Clicks" Mobility   Outcome Measure  Help needed turning from your back to your side while in a flat bed without using bedrails?: None Help needed moving from lying on your back to sitting on the side of a flat bed without using bedrails?: A Little Help needed moving to and from a bed to a chair (including a wheelchair)?: A Little Help needed standing up from a chair using your arms (e.g., wheelchair or bedside chair)?: A Little Help needed to walk in hospital room?: A Little Help needed climbing 3-5 steps with a railing? : A Little 6 Click Score: 19    End of Session Equipment Utilized During Treatment: Gait belt Activity Tolerance: Patient tolerated treatment well Patient left: in chair;with call bell/phone within reach;with chair alarm set;with family/visitor present Nurse Communication: Mobility status PT Visit Diagnosis: Unsteadiness on feet (R26.81);Muscle weakness (generalized) (M62.81);Difficulty in walking, not elsewhere classified (R26.2)     Time: [id number] PT Time Calculation (min) (ACUTE ONLY): 21 min  Charges:  $Therapeutic Activity: 8-22 mins                     Wyona Almas, PT, DPT Acute Rehabilitation Services Office [phone]    Deno Etienne 06/12/2022, 10:48 AM

## 2022-06-12 NOTE — Plan of Care (Signed)
  Problem: Education: Goal: Knowledge of secondary prevention will improve (MUST DOCUMENT ALL) Outcome: Progressing Goal: Knowledge of patient specific risk factors will improve Tiffany Rice N/A or DELETE if not current risk factor) Outcome: Progressing   Problem: Ischemic Stroke/TIA Tissue Perfusion: Goal: Complications of ischemic stroke/TIA will be minimized Outcome: Progressing   Problem: Coping: Goal: Will verbalize positive feelings about self Outcome: Progressing Goal: Will identify appropriate support needs Outcome: Progressing   Problem: Health Behavior/Discharge Planning: Goal: Ability to manage health-related needs will improve Outcome: Progressing   Problem: Self-Care: Goal: Ability to participate in self-care as condition permits will improve Outcome: Progressing Goal: Verbalization of feelings and concerns over difficulty with self-care will improve Outcome: Progressing Goal: Ability to communicate needs accurately will improve Outcome: Progressing   Problem: Nutrition: Goal: Risk of aspiration will decrease Outcome: Progressing   Problem: Education: Goal: Knowledge of General Education information will improve Description: Including pain rating scale, medication(s)/side effects and non-pharmacologic comfort measures Outcome: Progressing   Problem: Health Behavior/Discharge Planning: Goal: Ability to manage health-related needs will improve Outcome: Progressing   Problem: Clinical Measurements: Goal: Ability to maintain clinical measurements within normal limits will improve Outcome: Progressing Goal: Will remain free from infection Outcome: Progressing Goal: Diagnostic test results will improve Outcome: Progressing Goal: Respiratory complications will improve Outcome: Progressing Goal: Cardiovascular complication will be avoided Outcome: Progressing   Problem: Activity: Goal: Risk for activity intolerance will decrease Outcome: Progressing   Problem:  Nutrition: Goal: Adequate nutrition will be maintained Outcome: Progressing   Problem: Coping: Goal: Level of anxiety will decrease Outcome: Progressing   Problem: Elimination: Goal: Will not experience complications related to bowel motility Outcome: Progressing Goal: Will not experience complications related to urinary retention Outcome: Progressing   Problem: Pain Managment: Goal: General experience of comfort will improve Outcome: Progressing   Problem: Safety: Goal: Ability to remain free from injury will improve Outcome: Progressing   Problem: Skin Integrity: Goal: Risk for impaired skin integrity will decrease Outcome: Progressing

## 2022-06-13 NOTE — Plan of Care (Signed)
  Problem: Education: Goal: Knowledge of secondary prevention will improve (MUST DOCUMENT ALL) Outcome: Progressing Goal: Knowledge of patient specific risk factors will improve Tiffany Rice N/A or DELETE if not current risk factor) Outcome: Progressing   Problem: Ischemic Stroke/TIA Tissue Perfusion: Goal: Complications of ischemic stroke/TIA will be minimized Outcome: Progressing   Problem: Coping: Goal: Will verbalize positive feelings about self Outcome: Progressing   Problem: Self-Care: Goal: Ability to participate in self-care as condition permits will improve Outcome: Progressing   Problem: Nutrition: Goal: Risk of aspiration will decrease Outcome: Progressing

## 2022-06-13 NOTE — Progress Notes (Signed)
Discharge report called to [facility] at [phone]; report given to receiving RN.  Patients son Jenny Reichmann to transport to facility per Josie, LCSW.

## 2022-06-13 NOTE — Progress Notes (Signed)
[place] auth [id number], next review 3/12. Confirmed with Josh at [facility] they can accept pt today. DC order in, will need dc summary. MD updated.   Wandra Feinstein, MSW, LCSW [phone] (coverage)

## 2022-06-13 NOTE — Progress Notes (Signed)
[facility] PROGRESS NOTE    Progress Note  Tiffany Rice  [id number] DOB: June 30, 1931 DOA: 06/08/2022 PCP: Haywood Pao, MD     Brief Narrative:   Tiffany Rice is an 89 y.o. female past medical history of essential hypertension, hyperlipidemia brought into the by family for slurred speech that started about 5 days prior to admission, MRI of the brain showed late acute infarct of the posterior putamen and corona radiator  Assessment/Plan:   Acute lacunar infarct/hypertensive emergency: Initially required nicardipine which is now has been weaned off. Pressure is well-controlled on Coreg and Avapro. Neurology was consulted recommended aspirin and Plavix for 3 weeks and then aspirin alone. PT evaluated the patient, will need skilled nursing facility placement.  Awaiting insurance authorization. 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  Dyslipidemia: Increased trends.  Hypothyroidism:  Continue Synthroid.   DVT prophylaxis: lovenox Family Communication:none Status is: Inpatient Remains inpatient appropriate because: Acute CVA    Code Status:     Code Status Orders  (From admission, onward)           Start     Ordered   06/08/22 1659  Do not attempt resuscitation (DNR)  Continuous       Question Answer Comment  If patient has no pulse and is not breathing Do Not Attempt Resuscitation   If patient has a pulse and/or is breathing: Medical Treatment Goals LIMITED ADDITIONAL INTERVENTIONS: Use medication/IV fluids and cardiac monitoring as indicated; Do not use intubation or mechanical ventilation (DNI), also provide comfort medications.  Transfer to Progressive/Stepdown as indicated, avoid Intensive Care.   Consent: Discussion documented in EHR or advanced directives reviewed      06/08/22 1700           Code Status History     This patient has a current code status but no historical code status.      Advance  Directive Documentation    Flowsheet Row Most Recent Value  Type of Advance Directive Healthcare Power of Attorney, Living will  Pre-existing out of facility DNR order (yellow form or pink MOST form) --  "MOST" Form in Place? --         IV Access:   Peripheral IV   Procedures and diagnostic studies:   No results found.   Medical Consultants:   None.   Subjective:    Tiffany Rice no complains  Objective:    Vitals:   06/12/22 1517 06/12/22 2310 06/13/22 0317 06/13/22 0756  BP: (!) 111/53 (!) 154/83 123/65 (!) 142/70  Pulse: 71 66 74 66  Resp: '20 17 17 16  '$ Temp: 98 F (36.7 C) 98.2 F (36.8 C) 98.8 F (37.1 C) 97.9 F (36.6 C)  TempSrc: Oral Oral Oral Oral  SpO2: 95% 95% 92% 92%  Weight:      Height:       SpO2: 92 % O2 Flow Rate (L/min): 2 L/min   Intake/Output Summary (Last 24 hours) at 06/13/2022 0859 Last data filed at 06/12/2022 1700 Gross per 24 hour  Intake 720 ml  Output --  Net 720 ml    Filed Weights   06/08/22 1320  Weight: 76.7 kg    Exam: General exam: In no acute distress. Respiratory system: Good air movement and clear to auscultation. Cardiovascular system: S1 & S2 heard, RRR. No JVD. Gastrointestinal system: Abdomen is nondistended, soft and nontender.  Central nervous system: Alert and oriented. No focal neurological deficits. Extremities: No pedal  edema. Skin: No rashes, lesions or ulcers Psychiatry: Judgement and insight appear normal. Mood & affect appropriate.    Data Reviewed:    Labs: Basic Metabolic Panel: Recent Labs  Lab 06/08/22 1536 06/08/22 1559 06/08/22 1957  NA 138 141  --   K 3.4* 3.9  --   CL 105 105  --   CO2 26  --   --   GLUCOSE 99 89  --   BUN 8 8  --   CREATININE 0.83 0.60 0.74  CALCIUM 9.5  --   --     GFR Estimated Creatinine Clearance: 49.8 mL/min (by C-G formula based on SCr of 0.74 mg/dL). Liver Function Tests: No results for input(s): "AST", "ALT", "ALKPHOS", "BILITOT",  "PROT", "ALBUMIN" in the last 168 hours. No results for input(s): "LIPASE", "AMYLASE" in the last 168 hours. No results for input(s): "AMMONIA" in the last 168 hours. Coagulation profile No results for input(s): "INR", "PROTIME" in the last 168 hours. COVID-19 Labs  No results for input(s): "DDIMER", "FERRITIN", "LDH", "CRP" in the last 72 hours.  No results found for: "SARSCOV2NAA"  CBC: Recent Labs  Lab 06/08/22 1536 06/08/22 1559 06/08/22 1957  WBC 5.3  --  5.1  HGB 13.7 14.3 16.0*  HCT 41.8 42.0 45.7  MCV 89.5  --  86.1  PLT 198  --  209    Cardiac Enzymes: No results for input(s): "CKTOTAL", "CKMB", "CKMBINDEX", "TROPONINI" in the last 168 hours. BNP (last 3 results) No results for input(s): "PROBNP" in the last 8760 hours. CBG: Recent Labs  Lab 06/08/22 1322  GLUCAP 141*    D-Dimer: No results for input(s): "DDIMER" in the last 72 hours. Hgb A1c: No results for input(s): "HGBA1C" in the last 72 hours.  Lipid Profile: No results for input(s): "CHOL", "HDL", "LDLCALC", "TRIG", "CHOLHDL", "LDLDIRECT" in the last 72 hours.  Thyroid function studies: No results for input(s): "TSH", "T4TOTAL", "T3FREE", "THYROIDAB" in the last 72 hours.  Invalid input(s): "FREET3" Anemia work up: No results for input(s): "VITAMINB12", "FOLATE", "FERRITIN", "TIBC", "IRON", "RETICCTPCT" in the last 72 hours. Sepsis Labs: Recent Labs  Lab 06/08/22 1536 06/08/22 1957  WBC 5.3 5.1    Microbiology Recent Results (from the past 240 hour(s))  MRSA Next Gen by PCR, Nasal     Status: None   Collection Time: 06/08/22  7:43 PM   Specimen: Nasal Mucosa; Nasal Swab  Result Value Ref Range Status   MRSA by PCR Next Gen NOT DETECTED NOT DETECTED Final    Comment: (NOTE) The GeneXpert MRSA Assay (FDA approved for NASAL specimens only), is one component of a comprehensive MRSA colonization surveillance program. It is not intended to diagnose MRSA infection nor to guide or monitor  treatment for MRSA infections. Test performance is not FDA approved in patients less than 44 years old. Performed at [facility], [place] [address], [place], [place] [address]      Medications:    aspirin EC  81 mg Oral Daily   carvedilol  6.25 mg Oral BID WC   Chlorhexidine Gluconate Cloth  6 each Topical Daily   clopidogrel  75 mg Oral Daily   enoxaparin (LOVENOX) injection  40 mg Subcutaneous Q24H   irbesartan  300 mg Oral Daily   levothyroxine  88 mcg Oral Q0600   rosuvastatin  20 mg Oral Daily   Continuous Infusions:  sodium chloride        LOS: 5 days   Charlynne Cousins  [facility]  06/13/2022, 8:59 AM

## 2022-06-13 NOTE — Plan of Care (Signed)
  Problem: Education: Goal: Knowledge of secondary prevention will improve (MUST DOCUMENT ALL) 06/13/2022 1311 by Yolanda Bonine, RN Outcome: Completed/Met 06/13/2022 1034 by Yolanda Bonine, RN Outcome: Progressing Goal: Knowledge of patient specific risk factors will improve [place]ta [place] N/A or DELETE if not current risk factor) 06/13/2022 1311 by Yolanda Bonine, RN Outcome: Completed/Met 06/13/2022 1034 by Yolanda Bonine, RN Outcome: Progressing   Problem: Ischemic Stroke/TIA Tissue Perfusion: Goal: Complications of ischemic stroke/TIA will be minimized 06/13/2022 1311 by Yolanda Bonine, RN Outcome: Completed/Met 06/13/2022 1034 by Yolanda Bonine, RN Outcome: Progressing   Problem: Coping: Goal: Will verbalize positive feelings about self 06/13/2022 1311 by Yolanda Bonine, RN Outcome: Completed/Met 06/13/2022 1034 by Yolanda Bonine, RN Outcome: Progressing Goal: Will identify appropriate support needs Outcome: Completed/Met   Problem: Health Behavior/Discharge Planning: Goal: Ability to manage health-related needs will improve Outcome: Completed/Met   Problem: Self-Care: Goal: Ability to participate in self-care as condition permits will improve 06/13/2022 1311 by Yolanda Bonine, RN Outcome: Completed/Met 06/13/2022 1034 by Yolanda Bonine, RN Outcome: Progressing Goal: Verbalization of feelings and concerns over difficulty with self-care will improve Outcome: Completed/Met Goal: Ability to communicate needs accurately will improve Outcome: Completed/Met   Problem: Nutrition: Goal: Risk of aspiration will decrease 06/13/2022 1311 by Yolanda Bonine, RN Outcome: Completed/Met 06/13/2022 1034 by Yolanda Bonine, RN Outcome: Progressing   Problem: Education: Goal: Knowledge of General Education information will improve Description: Including pain rating scale, medication(s)/side effects and non-pharmacologic comfort measures Outcome: Completed/Met   Problem: Health  Behavior/Discharge Planning: Goal: Ability to manage health-related needs will improve Outcome: Completed/Met   Problem: Clinical Measurements: Goal: Ability to maintain clinical measurements within normal limits will improve Outcome: Completed/Met Goal: Will remain free from infection Outcome: Completed/Met Goal: Diagnostic test results will improve Outcome: Completed/Met Goal: Respiratory complications will improve Outcome: Completed/Met Goal: Cardiovascular complication will be avoided Outcome: Completed/Met   Problem: Activity: Goal: Risk for activity intolerance will decrease Outcome: Completed/Met   Problem: Nutrition: Goal: Adequate nutrition will be maintained Outcome: Completed/Met   Problem: Coping: Goal: Level of anxiety will decrease Outcome: Completed/Met   Problem: Elimination: Goal: Will not experience complications related to bowel motility Outcome: Completed/Met Goal: Will not experience complications related to urinary retention Outcome: Completed/Met   Problem: Pain Managment: Goal: General experience of comfort will improve Outcome: Completed/Met   Problem: Safety: Goal: Ability to remain free from injury will improve Outcome: Completed/Met   Problem: Skin Integrity: Goal: Risk for impaired skin integrity will decrease Outcome: Completed/Met

## 2022-06-13 NOTE — Progress Notes (Signed)
Occupational Therapy Treatment Patient Details Name: Tiffany Rice MRN: [id number] DOB: 1932-03-24 Today's Date: 06/13/2022   History of present illness Pt is an 89yo female who was brough to ED with generalized weakness and impaired speech. 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 PMH: HTN, dyslipidemia, & hypothyroidism   OT comments  Pt. Seen for skilled OT treatment session. Pt. Able to complete bed mobility mod I.  Lb dressing with cues for safe tech.  Toileting and grooming with min guard a.  Cues for rw management and hand placement for safe controlled sit/stand and stand/sit.  Cont. With current acute ot poc.  Agree with current d/c recommendations.     Recommendations for follow up therapy are one component of a multi-disciplinary discharge planning process, led by the attending physician.  Recommendations may be updated based on patient status, additional functional criteria and insurance authorization.    Follow Up Recommendations  Home health OT     Assistance Recommended at Discharge PRN  Patient can return home with the following  A little help with walking and/or transfers;A little help with bathing/dressing/bathroom;Help with stairs or ramp for entrance;Assistance with cooking/housework;Assist for transportation   Equipment Recommendations  None recommended by OT    Recommendations for Other Services      Precautions / Restrictions Precautions Precautions: Fall       Mobility Bed Mobility Overal bed mobility: Modified Independent                  Transfers Overall transfer level: Needs assistance Equipment used: Rolling walker (2 wheels), None Transfers: Sit to/from Stand, Bed to chair/wheelchair/BSC Sit to Stand: Min assist Stand pivot transfers: Min assist         General transfer comment: cues for hand placement, light minA to rise and steady     Balance                                           ADL either performed or  assessed with clinical judgement   ADL Overall ADL's : Needs assistance/impaired     Grooming: Wash/dry hands;Brushing hair;Min guard;Standing               Lower Body Dressing: Min guard;Sitting/lateral leans Lower Body Dressing Details (indicate cue type and reason): initially laying all the way back with legs in the air to reach feet. suggested seated and crossing legs, pt. was able to do that and it was a more safe/stable approach reviewed this with pt. also Toilet Transfer: Supervision/safety;Grab bars;Ambulation;Regular Toilet;Cueing for sequencing;Cueing for safety Toilet Transfer Details (indicate cue type and reason): cues for hand placement on arm rests prior to sit/stand and stand/sit pt. did not demonstrate carry over Toileting- Clothing Manipulation and Hygiene: Set up;Cueing for sequencing;Sitting/lateral lean Toileting - Clothing Manipulation Details (indicate cue type and reason): pt. asking and gesturing how to wipe seated. pointed to the 3n1 toilet seat and the space between that and the toilet. gesturing and saying "how can i wipe" showed pt. where her peri area was located and she said "oh" and then was able to wipe without assistance     Functional mobility during ADLs: Min guard;Cueing for sequencing;Cueing for safety;Rolling walker (2 wheels) General ADL Comments: focus on toileting task, lb dressing, grooming with cues for rw management and hand placement for safe standing and sitting    Extremity/Trunk Assessment  Vision       Perception     Praxis      Cognition Arousal/Alertness: Awake/alert Behavior During Therapy: WFL for tasks assessed/performed Overall Cognitive Status: Impaired/Different from baseline Area of Impairment: Safety/judgement                         Safety/Judgement: Decreased awareness of safety, Decreased awareness of deficits     General Comments: no impulsiveness but requires cues for rw  management        Exercises      Shoulder Instructions       General Comments      Pertinent Vitals/ Pain       Pain Assessment Pain Assessment: No/denies pain  Home Living                                          Prior Functioning/Environment              Frequency  Min 2X/week        Progress Toward Goals  OT Goals(current goals can now be found in the care plan section)  Progress towards OT goals: Progressing toward goals     Plan Discharge plan remains appropriate    Co-evaluation                 AM-PAC OT "6 Clicks" Daily Activity     Outcome Measure   Help from another person eating meals?: None Help from another person taking care of personal grooming?: A Little Help from another person toileting, which includes using toliet, bedpan, or urinal?: A Little Help from another person bathing (including washing, rinsing, drying)?: A Little Help from another person to put on and taking off regular upper body clothing?: A Little Help from another person to put on and taking off regular lower body clothing?: A Little 6 Click Score: 19    End of Session Equipment Utilized During Treatment: Gait belt;Rolling walker (2 wheels)  OT Visit Diagnosis: Unsteadiness on feet (R26.81);Muscle weakness (generalized) (M62.81)   Activity Tolerance Patient tolerated treatment well   Patient Left in chair;with call bell/phone within reach;with chair alarm set   Nurse Communication          Time: 1001-1014 OT Time Calculation (min): 13 min  Charges: OT General Charges $OT Visit: 1 Visit OT Treatments $Self Care/Home Management : 8-22 mins  Tiffany Rice, COTA/L Acute Rehabilitation [phone]   Tiffany Rice-COTA/L 06/13/2022, 12:34 PM

## 2022-06-13 NOTE — TOC Transition Note (Signed)
Transition of Care [facility]) - CM/SW Discharge Note   Patient Details  Name: Tiffany Rice MRN: [id number] Date of Birth: Oct 27, 1931  [facility] CM/SW Contact:  Amador Cunas, [place] Phone Number: 06/13/2022, 11:33 AM   Clinical Narrative:   Pt for dc to [facility] today. Spoke to [facility] in admissions who confirmed they are prepared to admit pt to room 503B. Pt's sons aware of dc and will provide transport. RN provided with number for report. SW signing off at dc.   Wandra Feinstein, MSW, LCSW [phone] (coverage)      Final next level of care: Skilled Nursing Facility Barriers to Discharge: No Barriers Identified   Patient Goals and CMS Choice CMS Medicare.gov Compare Post Acute Care list provided to:: Patient Choice offered to / list presented to : Patient  Discharge Placement                Patient chooses bed at: [facility] Patient to be transferred to facility by: sons to transport Name of family member notified: John/son Patient and family notified of of transfer: 06/13/22  Discharge Plan and Services Additional resources added to the After Visit Summary for                  DME Arranged: Walker rolling DME Agency: [employer] Date DME Agency Contacted: 06/11/22 Time DME Agency Contacted: [phone] Representative spoke with at DME Agency: Erasmo Downer HH Arranged: PT, OT, Speech Therapy HH Agency: [place] Date [place]: 06/11/22   Representative spoke with at [place]: [place]) Interventions [place]: No Food Insecurity (06/10/2022)  Housing: Low Risk  (06/10/2022)  Transportation Needs: No Transportation Needs (06/10/2022)  Utilities: Not At Risk (06/10/2022)  Tobacco Use: Medium Risk (06/10/2022)     Readmission Risk Interventions     No data to display

## 2022-06-13 NOTE — Discharge Summary (Signed)
Physician Discharge Summary  Tiffany Rice [id number] DOB: 20-Aug-1931 DOA: 06/08/2022  PCP: Haywood Pao, MD  Admit date: 06/08/2022 Discharge date: 06/13/2022  Admitted From: Home Disposition:  Home  Recommendations for Outpatient Follow-up:  Follow up with neurology in 1-2 weeks Please obtain BMP/CBC in one week SNF with PT,patient refused angiogram  Home Health:Yes Equipment/Devices:None  Discharge Condition:Stable CODE STATUS:Full Diet recommendation: Heart Healthy   Brief/Interim Summary: 89 y.o. female past medical history of essential hypertension, hyperlipidemia brought into the by family for slurred speech that started about 5 days prior to admission, MRI of the brain showed late acute infarct of the posterior putamen and corona radiata.  Discharge Diagnoses:  Principal Problem:   Slurred speech Active Problems:   Hypertensive emergency   CVA (cerebral vascular accident) ([facility])  Acute lacunar infarct/hypertensive emergency: Initially required nicardipine placed in the ICU her blood pressure improved neurology was consulted. 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 Neurology agree with aspirin and Plavix for 3 weeks then aspirin alone. Her LDL was 141 she was started on a statin. A1c is 5.8. Physical therapy evaluated the patient recommended home health PT.  Dyslipidemia: Continue Crestor.  Hypothyroidism: Continue Synthroid  Discharge Instructions  Discharge Instructions     Ambulatory referral to Neurology   Complete by: As directed    Follow up with stroke clinic NP (Jessica Vanschaick or Cecille Rubin, if both not available, consider Zachery Dauer, or Ahern) at [facility] in about 4 weeks. Thanks.   Diet - low sodium heart healthy   Complete by: As directed    Increase activity slowly   Complete by: As directed       Allergies as of 06/13/2022        Reactions   Bactrim [sulfamethoxazole-trimethoprim] Other (See Comments)   Mood swings        Medication List     TAKE these medications    Aspirin Low Dose 81 MG tablet Generic drug: aspirin EC Take 1 tablet (81 mg total) by mouth daily. Swallow whole.   carvedilol 6.25 MG tablet Commonly known as: COREG Take 1 tablet (6.25 mg total) by mouth 2 (two) times daily with a meal.   clopidogrel 75 MG tablet Commonly known as: PLAVIX Take 1 tablet (75 mg total) by mouth daily for 19 days.   levothyroxine 88 MCG tablet Commonly known as: SYNTHROID Take 88 mcg by mouth daily.   olmesartan 40 MG tablet Commonly known as: BENICAR Take 40 mg by mouth daily.   potassium chloride 10 MEQ tablet Commonly known as: KLOR-CON Take 10 mEq by mouth daily.   pravastatin 20 MG tablet Commonly known as: PRAVACHOL Take 20 mg by mouth at bedtime.   rosuvastatin 20 MG tablet Commonly known as: CRESTOR Take 1 tablet (20 mg total) by mouth daily.   VITAMIN D-3 PO Take 1 capsule by mouth daily.               Durable Medical Equipment  (From admission, onward)           Start     Ordered   06/11/22 0847  For home use only DME Walker rolling  Once       Question Answer Comment  Walker: With 5 Inch Wheels   Patient needs a walker to treat with the following condition Stroke ([place])      06/11/22 [id number]  Contact information for follow-up providers     [facility]. Schedule an appointment as soon as possible for a visit in 1 month(s).   Specialty: Neurology Why: stroke clinic Contact information: [place], [place] Follow up.   Specialty: [place] Why: The home health agency will contact you for the first home visit Contact information: [place] [address] [phone]              Contact  information for after-discharge care     Destination     [facility] .   Service: Skilled Nursing Contact information: [address] Address [place] [address] [phone]                    Allergies  Allergen Reactions   Bactrim [Sulfamethoxazole-Trimethoprim] Other (See Comments)    Mood swings     Consultations: Neurology   Procedures/Studies: VAS US CAROTID (at [facility] and [facility] only)  Result Date: 06/11/2022 Carotid Arterial Duplex Study Patient Name:  Tiffany Rice  Date of Exam:   06/09/2022 Medical Rec #: [id number]           Accession #:    [id number] Date of Birth: 29-Sep-1931           Patient Gender: F Patient Age:   43 years Exam Location:  [facility] Procedure:      VAS US CAROTID Referring Phys: Domenic Polite --------------------------------------------------------------------------------  Indications:       CVA. Risk Factors:      Hypertension, hyperlipidemia. Comparison Study:  10-09-2018 Prior carotid duplex study. Performing Technologist: Darlin Coco RDMS, RVT  Examination Guidelines: A complete evaluation includes B-mode imaging, spectral Doppler, color Doppler, and power Doppler as needed of all accessible portions of each vessel. Bilateral testing is considered an integral part of a complete examination. Limited examinations for reoccurring indications may be performed as noted.  Right Carotid Findings: +----------+--------+--------+--------+------------------+--------+           PSV cm/sEDV cm/sStenosisPlaque DescriptionComments +----------+--------+--------+--------+------------------+--------+ CCA Prox  54      12                                         +----------+--------+--------+--------+------------------+--------+ CCA Distal42      11              calcific                   +----------+--------+--------+--------+------------------+--------+ ICA Prox  30      8       1-39%    heterogenous               +----------+--------+--------+--------+------------------+--------+ ICA Distal29      9                                          +----------+--------+--------+--------+------------------+--------+ ECA       60      8                                          +----------+--------+--------+--------+------------------+--------+ +----------+--------+-------+----------------+-------------------+  PSV cm/sEDV cmsDescribe        Arm Pressure (mmHG) +----------+--------+-------+----------------+-------------------+ [id number]             Multiphasic, WNL                    +----------+--------+-------+----------------+-------------------+ +---------+--------+--+--------+-+---------+ VertebralPSV cm/s34EDV cm/s7Antegrade +---------+--------+--+--------+-+---------+  Left Carotid Findings: +----------+--------+--------+--------+------------------+--------+           PSV cm/sEDV cm/sStenosisPlaque DescriptionComments +----------+--------+--------+--------+------------------+--------+ CCA Prox  53      7                                          +----------+--------+--------+--------+------------------+--------+ CCA Distal42      8                                          +----------+--------+--------+--------+------------------+--------+ ICA Prox  26      6       1-39%   hyperechoic                +----------+--------+--------+--------+------------------+--------+ ICA Distal89      21                                         +----------+--------+--------+--------+------------------+--------+ ECA       54      9                                          +----------+--------+--------+--------+------------------+--------+ +----------+--------+--------+----------------+-------------------+           PSV cm/sEDV cm/sDescribe        Arm Pressure (mmHG)  +----------+--------+--------+----------------+-------------------+ [id number]              Multiphasic, WNL                    +----------+--------+--------+----------------+-------------------+ +---------+--------+--+--------+-+---------+ VertebralPSV cm/s41EDV cm/s8Antegrade +---------+--------+--+--------+-+---------+   Summary: Right Carotid: Velocities in the right ICA are consistent with a 1-39% stenosis. Left Carotid: Velocities in the left ICA are consistent with a 1-39% stenosis. Vertebrals:  Bilateral vertebral arteries demonstrate antegrade flow. Subclavians: Normal flow hemodynamics were seen in bilateral subclavian              arteries. *See table(s) above for measurements and observations.  Electronically signed by Antony Contras MD on 06/11/2022 at 8:30:37 AM.    Final    ECHOCARDIOGRAM COMPLETE  Result Date: 06/09/2022    ECHOCARDIOGRAM REPORT   Patient Name:   Tiffany Rice Date of Exam: 06/09/2022 Medical Rec #:  [id number]          Height:       65.0 in Accession #:    [id number]         Weight:       169.0 lb Date of Birth:  09-08-1931          BSA:          1.841 m Patient Age:    15 years           BP:           136/89 mmHg Patient Gender: F  HR:           67 bpm. 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                 Stroke, Signs/Symptoms:Chest Pain; Risk Factors:Hypertension and                 Dyslipidemia.  Sonographer:    Roseanna Rainbow RDCS Referring Phys: [place]  1. Left ventricular ejection fraction, by estimation, is 65 to 70%. The left ventricle has normal function. The left ventricle has no regional wall motion abnormalities. There is mild asymmetric left ventricular hypertrophy of the  basal-septal segment. Left ventricular diastolic parameters are consistent with Grade I diastolic dysfunction (impaired relaxation).  2. Right ventricular systolic function is hyperdynamic. The right ventricular size is normal. There is normal pulmonary artery systolic pressure. The estimated right ventricular systolic pressure is 123XX123 mmHg.  3. Left atrial size was moderately dilated.  4. The mitral valve is normal in structure. No evidence of mitral valve regurgitation. No evidence of mitral stenosis.  5. The aortic valve is tricuspid. There is mild calcification of the aortic valve. Aortic valve regurgitation is trivial. No aortic stenosis is present.  6. The inferior vena cava is normal in size with greater than 50% respiratory variability, suggesting right atrial pressure of 3 mmHg. FINDINGS  Left Ventricle: Left ventricular ejection fraction, by estimation, is 65 to 70%. The left ventricle has normal function. The left ventricle has no regional wall motion abnormalities. The left ventricular internal cavity size was normal in size. There is  mild asymmetric left ventricular hypertrophy of the basal-septal segment. Left ventricular diastolic parameters are consistent with Grade I diastolic dysfunction (impaired relaxation). Right Ventricle: The right ventricular size is normal. No increase in right ventricular wall thickness. Right ventricular systolic function is hyperdynamic. There is normal pulmonary artery systolic pressure. The tricuspid regurgitant velocity is 2.10 m/s, and with an assumed right atrial pressure of 3 mmHg, the estimated right ventricular systolic pressure is 123XX123 mmHg. Left Atrium: Left atrial size was moderately dilated. Right Atrium: Right atrial size was normal in size. Pericardium: There is no evidence of pericardial effusion. Mitral Valve: The mitral valve is normal in structure. No evidence of mitral valve regurgitation. No evidence of mitral valve stenosis. Tricuspid Valve: The  tricuspid valve is normal in structure. Tricuspid valve regurgitation is mild. Aortic Valve: The aortic valve is tricuspid. There is mild calcification of the aortic valve. Aortic valve regurgitation is trivial. No aortic stenosis is present. Pulmonic Valve: The pulmonic valve was normal in structure. Pulmonic valve regurgitation is not visualized. Aorta: The aortic root is normal in size and structure. Venous: The inferior vena cava is normal in size with greater than 50% respiratory variability, suggesting right atrial pressure of 3 mmHg. IAS/Shunts: No atrial level shunt detected by color flow Doppler.  LEFT VENTRICLE PLAX 2D LVIDd:         3.50 cm     Diastology LVIDs:         2.30 cm     LV e' medial:    5.44 cm/s LV PW:         0.90 cm  LV E/e' medial:  11.9 LV IVS:        1.30 cm     LV e' lateral:   6.85 cm/s LVOT diam:     2.10 cm     LV E/e' lateral: 9.4 LV SV:         90 LV SV Index:   49 LVOT Area:     3.46 cm  LV Volumes (MOD) LV vol d, MOD A2C: 49.2 ml LV vol d, MOD A4C: 31.6 ml LV vol s, MOD A2C: 10.7 ml LV vol s, MOD A4C: 10.2 ml LV SV MOD A2C:     38.5 ml LV SV MOD A4C:     31.6 ml LV SV MOD BP:      28.8 ml RIGHT VENTRICLE             IVC RV S prime:     11.90 cm/s  IVC diam: 1.50 cm TAPSE (M-mode): 1.7 cm LEFT ATRIUM             Index        RIGHT ATRIUM           Index LA diam:        1.70 cm 0.92 cm/m   RA Area:     13.10 cm LA Vol (A2C):   24.8 ml 13.47 ml/m  RA Volume:   33.40 ml  18.14 ml/m LA Vol (A4C):   13.4 ml 7.28 ml/m LA Biplane Vol: 19.1 ml 10.37 ml/m  AORTIC VALVE LVOT Vmax:   117.00 cm/s LVOT Vmean:  81.100 cm/s LVOT VTI:    0.259 m  AORTA Ao Root diam: 2.90 cm Ao Asc diam:  3.40 cm MITRAL VALVE                TRICUSPID VALVE MV Area (PHT): 2.66 cm     TR Peak grad:   17.6 mmHg MV Decel Time: 285 msec     TR Vmax:        210.00 cm/s MV E velocity: 64.50 cm/s MV A velocity: 114.00 cm/s  SHUNTS MV E/A ratio:  0.57         Systemic VTI:  0.26 m                              Systemic Diam: 2.10 cm Dalton McleanMD Electronically signed by Franki Monte Signature Date/Time: 06/09/2022/4:56:42 PM    Final    MR BRAIN WO CONTRAST  Result Date: 06/09/2022 CLINICAL DATA:  Stroke, follow-up.  Slurred speech. EXAM: MRI HEAD WITHOUT CONTRAST TECHNIQUE: Multiplanar, multiecho pulse sequences of the brain and surrounding structures were obtained without intravenous contrast. COMPARISON:  CTA head 06/08/2022.  Head CT 06/08/2022. FINDINGS: Brain: Late acute infarct in the right posterior putamen and corona radiata. No acute intracranial hemorrhage. No mass or midline shift. Unchanged moderate chronic small-vessel disease and old lacunar infarct in the left basal ganglia. Chronic microhemorrhage in the right globus pallidus. No hydrocephalus or extra-axial collection. Vascular: Redemonstrated ectasia of the right supraclinoid ICA and basilar tip aneurysm, better evaluated on previous day's CTA. Skull and upper cervical spine: Normal marrow signal. Sinuses/Orbits: Unremarkable. Other: None. IMPRESSION: 1. Late acute infarct in the right posterior putamen and corona radiata. 2. 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 Electronically Signed   By: Emmit Alexanders M.D.   On: 06/09/2022 10:22   CT Angio Head  W or Wo Contrast  Result Date: 06/08/2022 CLINICAL DATA:  cerebral aneurysm. Stroke-like symptoms. Slurred speech. EXAM: CT ANGIOGRAPHY HEAD TECHNIQUE: Multidetector CT imaging of the head was performed using the standard protocol during bolus administration of intravenous contrast. Multiplanar CT image reconstructions and MIPs were obtained to evaluate the vascular anatomy. RADIATION DOSE REDUCTION: This exam was performed according to the departmental dose-optimization program which includes automated exposure control, adjustment of the mA and/or kV according to patient size and/or use of iterative reconstruction technique.  CONTRAST:  13m OMNIPAQUE IOHEXOL 350 MG/ML SOLN COMPARISON:  Head CT earlier same day. FINDINGS: CTA HEAD Anterior circulation: Both internal carotid arteries are patent through the skull base and siphon regions. There is atherosclerotic ectasia of the carotid siphons, right more than left. Aneurysmal dilatation with vascular diameter extending up to 8.4 mm on the right and 6.7 mm on the left. No focal berry aneurysm in this region. The anterior and middle cerebral vessels are patent. No large vessel occlusion or proximal stenosis. Posterior circulation: Both vertebral arteries are patent through the foramen magnum to the basilar artery. The left vertebral artery is dominant. No basilar stenosis. Posterior circulation branch vessels are patent. There is a wide mouth aneurysm at the basilar tip measuring up to 7 mm in diameter. Mouth of the aneurysm measures 6 mm. Venous sinuses: Patent and normal. Anatomic variants: None significant. Review of the MIP images confirms the above findings. IMPRESSION: 1. No intracranial large vessel occlusion or proximal stenosis. 2. 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 Mouth of the aneurysm measures 6 mm. 3. Atherosclerotic ectasia of the carotid siphons, right more than left. Aneurysmal dilatation with vascular diameter extending up to 8.4 mm on the right and 6.7 mm on the left. No focal berry aneurysm in this region. Electronically Signed   By: MNelson ChimesM.D.   On: 06/08/2022 15:41   CT Head Wo Contrast  Result Date: 06/08/2022 CLINICAL DATA:  Neuro deficit, acute, stroke suspected EXAM: CT HEAD WITHOUT CONTRAST TECHNIQUE: Contiguous axial images were obtained from the base of the skull through the vertex without intravenous contrast. RADIATION DOSE REDUCTION: This exam was performed according to the departmental dose-optimization program which includes automated exposure control, adjustment of the mA and/or kV according to patient size and/or use of iterative  reconstruction technique. COMPARISON:  None Available. FINDINGS: Brain: No evidence of acute large vascular territory infarction, hemorrhage, hydrocephalus, extra-axial collection or mass lesion/mass effect. Patchy white matter hypodensities, nonspecific but compatible with chronic microvascular ischemic disease. Remote perforator infarct in the bilateral basal ganglia. Remote left cerebellar infarct. Vascular: Suspected aneurysm of the basilar tip. No hyperdense vessel identified. Calcific atherosclerosis. Skull: No acute fracture. Sinuses/Orbits: Clear sinuses.  No acute orbital findings. Other: No mastoid effusions. IMPRESSION: 1. Suspected aneurysm of the basilar tip. Recommend CTA to confirm and further evaluate. 2. Otherwise, no evidence of acute intracranial abnormality. Chronic microvascular ischemic disease and remote appearing infarcts. MRI could better assess for acute infarct if clinically warranted. Electronically Signed   By: FMargaretha SheffieldM.D.   On: 06/08/2022 15:24   (Echo, Carotid, EGD, Colonoscopy, ERCP)    Subjective: No complaints  Discharge Exam: Vitals:   06/13/22 0317 06/13/22 0756  BP: 123/65 (!) 142/70  Pulse: 74 66  Resp: 17 16  Temp: 98.8 F (37.1 C) 97.9 F (36.6 C)  SpO2: 92% 92%   Vitals:   06/12/22 1517 06/12/22 2310 06/13/22 0317 06/13/22 0756  BP: (!) 111/53 (!) 154/83 123/65 (Marland Kitchen  142/70  Pulse: 71 66 74 66  Resp: '20 17 17 16  '$ Temp: 98 F (36.7 C) 98.2 F (36.8 C) 98.8 F (37.1 C) 97.9 F (36.6 C)  TempSrc: Oral Oral Oral Oral  SpO2: 95% 95% 92% 92%  Weight:      Height:        General: Pt is alert, awake, not in acute distress Cardiovascular: RRR, S1/S2 +, no rubs, no gallops Respiratory: CTA bilaterally, no wheezing, no rhonchi Abdominal: Soft, NT, ND, bowel sounds + Extremities: no edema, no cyanosis    The results of significant diagnostics from this hospitalization (including imaging, microbiology, ancillary and laboratory) are listed  below for reference.     Microbiology: Recent Results (from the past 240 hour(s))  MRSA Next Gen by PCR, Nasal     Status: None   Collection Time: 06/08/22  7:43 PM   Specimen: Nasal Mucosa; Nasal Swab  Result Value Ref Range Status   MRSA by PCR Next Gen NOT DETECTED NOT DETECTED Final    Comment: (NOTE) The GeneXpert MRSA Assay (FDA approved for NASAL specimens only), is one component of a comprehensive MRSA colonization surveillance program. It is not intended to diagnose MRSA infection nor to guide or monitor treatment for MRSA infections. Test performance is not FDA approved in patients less than 69 years old. Performed at [facility], [place] [address] [place], [place] [address]      Labs: BNP (last 3 results) No results for input(s): "BNP" in the last 8760 hours. Basic Metabolic Panel: Recent Labs  Lab 06/08/22 1536 06/08/22 1559 06/08/22 1957  NA 138 141  --   K 3.4* 3.9  --   CL 105 105  --   CO2 26  --   --   GLUCOSE 99 89  --   BUN 8 8  --   CREATININE 0.83 0.60 0.74  CALCIUM 9.5  --   --     Liver Function Tests: No results for input(s): "AST", "ALT", "ALKPHOS", "BILITOT", "PROT", "ALBUMIN" in the last 168 hours. No results for input(s): "LIPASE", "AMYLASE" in the last 168 hours. No results for input(s): "AMMONIA" in the last 168 hours. CBC: Recent Labs  Lab 06/08/22 1536 06/08/22 1559 06/08/22 1957  WBC 5.3  --  5.1  HGB 13.7 14.3 16.0*  HCT 41.8 42.0 45.7  MCV 89.5  --  86.1  PLT 198  --  209    Cardiac Enzymes: No results for input(s): "CKTOTAL", "CKMB", "CKMBINDEX", "TROPONINI" in the last 168 hours. BNP: Invalid input(s): "POCBNP" CBG: Recent Labs  Lab 06/08/22 1322  GLUCAP 141*    D-Dimer No results for input(s): "DDIMER" in the last 72 hours. Hgb A1c No results for input(s): "HGBA1C" in the last 72 hours.  Lipid Profile No results for input(s): "CHOL", "HDL", "LDLCALC", "TRIG", "CHOLHDL", "LDLDIRECT" in the last 72  hours.  Thyroid function studies No results for input(s): "TSH", "T4TOTAL", "T3FREE", "THYROIDAB" in the last 72 hours.  Invalid input(s): "FREET3" Anemia work up No results for input(s): "VITAMINB12", "FOLATE", "FERRITIN", "TIBC", "IRON", "RETICCTPCT" in the last 72 hours. Urinalysis No results found for: "COLORURINE", "APPEARANCEUR", "LABSPEC", "PHURINE", "GLUCOSEU", "HGBUR", "BILIRUBINUR", "KETONESUR", "PROTEINUR", "UROBILINOGEN", "NITRITE", "LEUKOCYTESUR" Sepsis Labs Recent Labs  Lab 06/08/22 1536 06/08/22 1957  WBC 5.3 5.1    Microbiology Recent Results (from the past 240 hour(s))  MRSA Next Gen by PCR, Nasal     Status: None   Collection Time: 06/08/22  7:43 PM   Specimen:  Nasal Mucosa; Nasal Swab  Result Value Ref Range Status   MRSA by PCR Next Gen NOT DETECTED NOT DETECTED Final    Comment: (NOTE) The GeneXpert MRSA Assay (FDA approved for NASAL specimens only), is one component of a comprehensive MRSA colonization surveillance program. It is not intended to diagnose MRSA infection nor to guide or monitor treatment for MRSA infections. Test performance is not FDA approved in patients less than 69 years old. Performed at [facility], [place] [address] [place], [place] [address]     SIGNED:   Charlynne Cousins, MD  [facility] 06/13/2022, 11:12 AM Pager   If 7PM-7AM, please contact night-coverage [email or web address] Password [email or web address]

## 2022-06-24 ENCOUNTER — Telehealth: Payer: Self-pay | Admitting: Neurology

## 2022-06-24 NOTE — Telephone Encounter (Signed)
Patients son has come in prior to appointment on 4/16 and is asking that driving be discouraged to mother at visit. He states mom wants to continue to drive and it's not safe. He is her POA. He states if any questions please reach out via call back.

## 2022-07-19 NOTE — Progress Notes (Unsigned)
Patient: Tiffany Rice Date of Birth: 21-Jun-1931  Reason for Visit: Follow up for stroke visit History from: Patient, Son-John  Primary Neurologist: Pearlean Brownie   ASSESSMENT AND PLAN 89 y.o. 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  Presenting symptoms of dysarthria and left-sided facial droop.  Is not interested in endovascular intervention for basilar tip aneurysm.  Vascular risk factors of hypertension, hyperlipidemia, advanced age.  -Can stop Plavix, continue aspirin 81 mg daily for secondary stroke prevention -Keep close follow-up with PCP for management of vascular risk factors to include BP less than 130/90, LDL less than 70, A1c less than 7.0 for secondary stroke prevention -MoCA shows mild cognitive impairment, we had a long discussion about her refraining from driving, she seems agreeable -Return to our office on an as-needed basis  HISTORY OF PRESENT ILLNESS: Today 07/20/22 Tiffany Rice is here for stroke clinic follow-up.  Presented 06/08/2022 with dysarthria and left-sided facial droop. Unclear exact LKW, for at least 2-3 days.  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  MRI of the brain showed acute infarct in right putamen and corona radiata.  She is not interested on intervention for basilar tip aneurysm. Here with son, Tiffany Rice who lives in [place]. Living in [place] since stroke. Still some slurred speech, slight drooping to left face, no weakness to left side. Is now on a walker. No falls. She completed rehab at another facility prior. Then transferred to [place]. Eating okay. We talked about her memory, admits occasional short term memory issues. Son has concerns about driving, he is POA. MOCA 19/30.  Is still taking aspirin and Plavix.  -CT head no acute abnormality.  There was small vessel disease. -CTA head and neck 7 mm basilar tip aneurysm -MRI of the  brain late acute infarct in right posterior putamen and corona radiata -2D echo EF 65 to 70% -Carotid ultrasound unremarkable -LDL 141, changed from Pravastatin to Rosuvastatin in hospital -A1c 5.6 -No antithrombotic prior to admission, DAPT 3 weeks aspirin 81 mg and Plavix 75 mg then aspirin alone  HISTORY  06/08/22 Dr. Wilford Corner HPI: Tiffany Rice is a 89 y.o. female past medical history of hypertension, hyperlipidemia, depression, presented to the emergency room for evaluation of slurred speech and weakness with last known well which is unclear but symptoms of slurred speech was noticed on the phone by daughter-in-law on Sunday.  Son came to visit today from [place] and noticed that speech was not normal and brought her in for further evaluation.  He also felt that his left angle of the mouth was drooping and she was drooling some liquid while trying to drink from that side.  She reports generalized weakness over the past few days.  She reports having had a fall in her yard-lives in a townhome, and required neighbors to help her to get up.  She reports that she missed a step and did not realize that her foot had caught in the step. No prior history of strokes.  Lives by herself.  Takes care of ADLs and expenses herself.  Has 3 children, all of whom live out of town.   LKW: Multiple days ago-unclear last known well IV thrombolysis given?: no, outside the window EVT: No-outside the Premorbid modified Rankin scale (mRS): 0    REVIEW OF SYSTEMS: Out of a complete 14 system review of symptoms, the patient complains only  of the following symptoms, and all other reviewed systems are negative.  See HPI  ALLERGIES: Allergies  Allergen Reactions   Bactrim [Sulfamethoxazole-Trimethoprim] Other (See Comments)    Mood swings     HOME MEDICATIONS: Outpatient Medications Prior to Visit  Medication Sig Dispense Refill   aspirin EC 81 MG tablet Take 1 tablet (81 mg total) by mouth daily. Swallow  whole. 30 tablet 12   carvedilol (COREG) 6.25 MG tablet Take 1 tablet (6.25 mg total) by mouth 2 (two) times daily with a meal. 60 tablet 3   Cholecalciferol (VITAMIN D-3 PO) Take 1 capsule by mouth daily.     clopidogrel (PLAVIX) 75 MG tablet Take 75 mg by mouth daily.     levothyroxine (SYNTHROID, LEVOTHROID) 88 MCG tablet Take 88 mcg by mouth daily.     olmesartan (BENICAR) 40 MG tablet Take 40 mg by mouth daily.  3   potassium chloride (K-DUR) 10 MEQ tablet Take 10 mEq by mouth daily.     rosuvastatin (CRESTOR) 20 MG tablet Take 1 tablet (20 mg total) by mouth daily. 30 tablet 3   potassium chloride (KLOR-CON M) 10 MEQ tablet Take 10 mEq by mouth daily.     amitriptyline (ELAVIL) 10 MG tablet Take 10 mg by mouth 2 (two) times daily.     pravastatin (PRAVACHOL) 20 MG tablet Take 20 mg by mouth at bedtime.     No facility-administered medications prior to visit.    PAST MEDICAL HISTORY: Past Medical History:  Diagnosis Date   Closed fracture of humerus 08/05/2013   pt fell while walking down the street   Complex ovarian cyst 04/05/2009   Left side.  Followed by ultrasound.   Depression    Diverticulosis    Esophageal stricture    GERD (gastroesophageal reflux disease)    Hemorrhoids    Hiatal hernia    HTN (hypertension)    Hyperlipidemia    Hypothyroidism    Obesity    Osteopenia 04/05/2008   Stroke     PAST SURGICAL HISTORY: Past Surgical History:  Procedure Laterality Date   CHOLECYSTECTOMY     MOHS SURGERY  1990   TUBAL LIGATION      FAMILY HISTORY: Family History  Problem Relation Age of Onset   Clotting disorder Father    Depression Father    Breast cancer Mother    Colon cancer Son    Colon polyps Daughter    Hyperlipidemia Son        x2    Hypertension Son        x 2   Hyperlipidemia Daughter     SOCIAL HISTORY: Social History   Socioeconomic History   Marital status: Divorced    Spouse name: Not on file   Number of children: 3   Years of  education: Not on file   Highest education level: Not on file  Occupational History   Occupation: Retired    Associate Professor: RETIRED  Tobacco Use   Smoking status: Former    Types: Cigarettes   Smokeless tobacco: Never  Building services engineer Use: Never used  Substance and Sexual Activity   Alcohol use: No   Drug use: No   Sexual activity: Not Currently    Birth control/protection: Post-menopausal  Other Topics Concern   Not on file  Social History Narrative   Not on file   Social Determinants of Health   Financial Resource Strain: Not on file  Food Insecurity: No Food Insecurity (06/10/2022)  Hunger Vital Sign    Worried About Running Out of Food in the Last Year: Never true    Ran Out of Food in the Last Year: Never true  Transportation Needs: No Transportation Needs (06/10/2022)   PRAPARE - Administrator, Civil Service (Medical): No    Lack of Transportation (Non-Medical): No  Physical Activity: Not on file  Stress: Not on file  Social Connections: Not on file  Intimate Partner Violence: Not At Risk (06/10/2022)   Humiliation, Afraid, Rape, and Kick questionnaire    Fear of Current or Ex-Partner: No    Emotionally Abused: No    Physically Abused: No    Sexually Abused: No    PHYSICAL EXAM  Vitals:   07/20/22 1004  BP: (!) 143/96  Pulse: (!) 58  Weight: 165 lb 8 oz (75.1 kg)  Height:  (1.651 m)   Body mass index is 27.54 kg/m.    07/20/2022   10:13 AM  Montreal Cognitive Assessment   Visuospatial/ Executive (0/5) 3  Naming (0/3) 3  Attention: Read list of digits (0/2) 1  Attention: Read list of letters (0/1) 1  Attention: Serial 7 subtraction starting at 100 (0/3) 3  Language: Repeat phrase (0/2) 1  Language : Fluency (0/1) 0  Abstraction (0/2) 2  Delayed Recall (0/5) 0  Orientation (0/6) 5  Total 19    Generalized: Well developed, in no acute distress  Neurological examination  Mentation: Alert oriented to time, place, history taking.  Follows all commands, mild dysarthria, no aphasia Cranial nerve II-XII: Pupils were equal round reactive to light. Extraocular movements were full, visual field were full on confrontational test. Facial sensation and strength were normal.  Head turning and shoulder shrug  were normal and symmetric.  Mild left-sided facial droop to mouth Motor: 5/5 strength, exception right upper extremity 4/5 due to shoulder injury Sensory: Sensory testing is intact to soft touch on all 4 extremities. No evidence of extinction is noted.  Coordination: Cerebellar testing reveals good finger-nose-finger and heel-to-shin bilaterally.  Gait and station: Gait is slightly wide-based, cautious, can walk independently.  Has a walker in the hallway.  Is overall steady. Reflexes: Deep tendon reflexes are symmetric but decreased  DIAGNOSTIC DATA (LABS, IMAGING, TESTING) - I reviewed patient records, labs, notes, testing and imaging myself where available.  Lab Results  Component Value Date   WBC 5.1 06/08/2022   HGB 16.0 (H) 06/08/2022   HCT 45.7 06/08/2022   MCV 86.1 06/08/2022   PLT 209 06/08/2022      Component Value Date/Time   NA 141 06/08/2022 1559   K 3.9 06/08/2022 1559   CL 105 06/08/2022 1559   CO2 26 06/08/2022 1536   GLUCOSE 89 06/08/2022 1559   BUN 8 06/08/2022 1559   CREATININE 0.74 06/08/2022 1957   CALCIUM 9.5 06/08/2022 1536   GFRNONAA >60 06/08/2022 1957   Lab Results  Component Value Date   CHOL 206 (H) 06/09/2022   HDL 48 06/09/2022   LDLCALC 141 (H) 06/09/2022   TRIG 84 06/09/2022   CHOLHDL 4.3 06/09/2022   Lab Results  Component Value Date   HGBA1C 5.6 06/08/2022   No results found for: "VITAMINB12" No results found for: "TSH"  Margie Ege, AGNP-C, DNP 07/20/2022, 10:47 AM [facility] [address] [place], [place] [address] [phone]

## 2022-07-20 ENCOUNTER — Ambulatory Visit (INDEPENDENT_AMBULATORY_CARE_PROVIDER_SITE_OTHER): Payer: Medicare Other | Admitting: Neurology

## 2022-07-20 ENCOUNTER — Encounter: Payer: Self-pay | Admitting: Neurology

## 2022-07-20 VITALS — BP 143/96 | HR 58 | Ht 65.0 in | Wt 165.5 lb

## 2022-07-20 DIAGNOSIS — I10 Essential (primary) hypertension: Secondary | ICD-10-CM | POA: Diagnosis not present

## 2022-07-20 DIAGNOSIS — E785 Hyperlipidemia, unspecified: Secondary | ICD-10-CM | POA: Diagnosis not present

## 2022-07-20 DIAGNOSIS — I639 Cerebral infarction, unspecified: Secondary | ICD-10-CM | POA: Diagnosis not present

## 2022-07-20 NOTE — Patient Instructions (Addendum)
Stop the Plavix, continue aspirin 81 mg daily for stroke prevention  Keep BP < 130/90, LDL < 70, A1C < 7.0 for secondary stroke prevention  Recommend healthy eating, exercise as tolerated  See you back as needed, continue close follow up with PCP

## 2022-07-21 ENCOUNTER — Other Ambulatory Visit: Payer: Self-pay | Admitting: Internal Medicine

## 2022-07-21 NOTE — Progress Notes (Signed)
I agree with the above plan 

## 2023-08-27 ENCOUNTER — Other Ambulatory Visit: Payer: Self-pay

## 2023-08-27 ENCOUNTER — Inpatient Hospital Stay (HOSPITAL_COMMUNITY)
Admission: EM | Admit: 2023-08-27 | Discharge: 2023-09-05 | DRG: 082 | Disposition: A | Discharge status: Hospice | Source: Skilled Nursing Facility | Attending: Internal Medicine | Admitting: Internal Medicine

## 2023-08-27 ENCOUNTER — Encounter (HOSPITAL_COMMUNITY): Payer: Self-pay

## 2023-08-27 ENCOUNTER — Emergency Department (HOSPITAL_COMMUNITY)

## 2023-08-27 ENCOUNTER — Other Ambulatory Visit (HOSPITAL_COMMUNITY)

## 2023-08-27 DIAGNOSIS — I120 Hypertensive chronic kidney disease with stage 5 chronic kidney disease or end stage renal disease: Secondary | ICD-10-CM | POA: Diagnosis present

## 2023-08-27 DIAGNOSIS — E860 Dehydration: Secondary | ICD-10-CM | POA: Diagnosis present

## 2023-08-27 DIAGNOSIS — N179 Acute kidney failure, unspecified: Secondary | ICD-10-CM | POA: Diagnosis present

## 2023-08-27 DIAGNOSIS — R296 Repeated falls: Secondary | ICD-10-CM | POA: Diagnosis not present

## 2023-08-27 DIAGNOSIS — M6282 Rhabdomyolysis: Secondary | ICD-10-CM | POA: Diagnosis present

## 2023-08-27 DIAGNOSIS — Z8249 Family history of ischemic heart disease and other diseases of the circulatory system: Secondary | ICD-10-CM

## 2023-08-27 DIAGNOSIS — Z83719 Family history of colon polyps, unspecified: Secondary | ICD-10-CM

## 2023-08-27 DIAGNOSIS — R54 Age-related physical debility: Secondary | ICD-10-CM | POA: Diagnosis present

## 2023-08-27 DIAGNOSIS — R627 Adult failure to thrive: Secondary | ICD-10-CM | POA: Diagnosis present

## 2023-08-27 DIAGNOSIS — E785 Hyperlipidemia, unspecified: Secondary | ICD-10-CM | POA: Diagnosis present

## 2023-08-27 DIAGNOSIS — R4189 Other symptoms and signs involving cognitive functions and awareness: Secondary | ICD-10-CM | POA: Diagnosis present

## 2023-08-27 DIAGNOSIS — N320 Bladder-neck obstruction: Secondary | ICD-10-CM | POA: Diagnosis present

## 2023-08-27 DIAGNOSIS — Z79899 Other long term (current) drug therapy: Secondary | ICD-10-CM

## 2023-08-27 DIAGNOSIS — I6200 Nontraumatic subdural hemorrhage, unspecified: Secondary | ICD-10-CM

## 2023-08-27 DIAGNOSIS — Z66 Do not resuscitate: Secondary | ICD-10-CM | POA: Diagnosis present

## 2023-08-27 DIAGNOSIS — I10 Essential (primary) hypertension: Secondary | ICD-10-CM | POA: Diagnosis not present

## 2023-08-27 DIAGNOSIS — T796XXA Traumatic ischemia of muscle, initial encounter: Secondary | ICD-10-CM

## 2023-08-27 DIAGNOSIS — W19XXXA Unspecified fall, initial encounter: Secondary | ICD-10-CM | POA: Diagnosis present

## 2023-08-27 DIAGNOSIS — S065XAA Traumatic subdural hemorrhage with loss of consciousness status unknown, initial encounter: Principal | ICD-10-CM | POA: Diagnosis present

## 2023-08-27 DIAGNOSIS — R7401 Elevation of levels of liver transaminase levels: Secondary | ICD-10-CM | POA: Diagnosis not present

## 2023-08-27 DIAGNOSIS — Z7982 Long term (current) use of aspirin: Secondary | ICD-10-CM | POA: Diagnosis not present

## 2023-08-27 DIAGNOSIS — Z789 Other specified health status: Secondary | ICD-10-CM | POA: Diagnosis not present

## 2023-08-27 DIAGNOSIS — Z79890 Hormone replacement therapy (postmenopausal): Secondary | ICD-10-CM

## 2023-08-27 DIAGNOSIS — Z8673 Personal history of transient ischemic attack (TIA), and cerebral infarction without residual deficits: Secondary | ICD-10-CM

## 2023-08-27 DIAGNOSIS — E875 Hyperkalemia: Secondary | ICD-10-CM | POA: Diagnosis present

## 2023-08-27 DIAGNOSIS — Z803 Family history of malignant neoplasm of breast: Secondary | ICD-10-CM

## 2023-08-27 DIAGNOSIS — Z881 Allergy status to other antibiotic agents status: Secondary | ICD-10-CM

## 2023-08-27 DIAGNOSIS — Z7902 Long term (current) use of antithrombotics/antiplatelets: Secondary | ICD-10-CM | POA: Diagnosis not present

## 2023-08-27 DIAGNOSIS — K219 Gastro-esophageal reflux disease without esophagitis: Secondary | ICD-10-CM | POA: Diagnosis present

## 2023-08-27 DIAGNOSIS — K5909 Other constipation: Secondary | ICD-10-CM | POA: Diagnosis present

## 2023-08-27 DIAGNOSIS — E038 Other specified hypothyroidism: Secondary | ICD-10-CM | POA: Diagnosis not present

## 2023-08-27 DIAGNOSIS — R339 Retention of urine, unspecified: Secondary | ICD-10-CM | POA: Diagnosis present

## 2023-08-27 DIAGNOSIS — R338 Other retention of urine: Secondary | ICD-10-CM | POA: Diagnosis not present

## 2023-08-27 DIAGNOSIS — Z87891 Personal history of nicotine dependence: Secondary | ICD-10-CM

## 2023-08-27 DIAGNOSIS — E039 Hypothyroidism, unspecified: Secondary | ICD-10-CM | POA: Diagnosis present

## 2023-08-27 DIAGNOSIS — N189 Chronic kidney disease, unspecified: Secondary | ICD-10-CM | POA: Diagnosis not present

## 2023-08-27 DIAGNOSIS — S96912A Strain of unspecified muscle and tendon at ankle and foot level, left foot, initial encounter: Secondary | ICD-10-CM | POA: Diagnosis present

## 2023-08-27 DIAGNOSIS — R400 Somnolence: Secondary | ICD-10-CM | POA: Diagnosis not present

## 2023-08-27 DIAGNOSIS — D72829 Elevated white blood cell count, unspecified: Secondary | ICD-10-CM | POA: Diagnosis present

## 2023-08-27 DIAGNOSIS — N186 End stage renal disease: Secondary | ICD-10-CM | POA: Diagnosis present

## 2023-08-27 DIAGNOSIS — M8580 Other specified disorders of bone density and structure, unspecified site: Secondary | ICD-10-CM | POA: Diagnosis present

## 2023-08-27 DIAGNOSIS — Z832 Family history of diseases of the blood and blood-forming organs and certain disorders involving the immune mechanism: Secondary | ICD-10-CM

## 2023-08-27 DIAGNOSIS — Z7189 Other specified counseling: Secondary | ICD-10-CM | POA: Diagnosis not present

## 2023-08-27 DIAGNOSIS — Z800 Family history of malignant neoplasm of digestive organs: Secondary | ICD-10-CM

## 2023-08-27 DIAGNOSIS — N83202 Unspecified ovarian cyst, left side: Secondary | ICD-10-CM | POA: Diagnosis present

## 2023-08-27 DIAGNOSIS — Z9049 Acquired absence of other specified parts of digestive tract: Secondary | ICD-10-CM

## 2023-08-27 DIAGNOSIS — Z515 Encounter for palliative care: Secondary | ICD-10-CM

## 2023-08-27 DIAGNOSIS — Z818 Family history of other mental and behavioral disorders: Secondary | ICD-10-CM

## 2023-08-27 LAB — COMPREHENSIVE METABOLIC PANEL WITH GFR
ALT: 160 U/L — ABNORMAL HIGH (ref 0–44)
AST: 317 U/L — ABNORMAL HIGH (ref 15–41)
Albumin: 3.1 g/dL — ABNORMAL LOW (ref 3.5–5.0)
Alkaline Phosphatase: 60 U/L (ref 38–126)
Anion gap: 14 (ref 5–15)
BUN: 84 mg/dL — ABNORMAL HIGH (ref 8–23)
CO2: 17 mmol/L — ABNORMAL LOW (ref 22–32)
Calcium: 10.5 mg/dL — ABNORMAL HIGH (ref 8.9–10.3)
Chloride: 110 mmol/L (ref 98–111)
Creatinine, Ser: 5.89 mg/dL — ABNORMAL HIGH (ref 0.44–1.00)
GFR, Estimated: 6 mL/min — ABNORMAL LOW (ref >60)
Glucose, Bld: 115 mg/dL — ABNORMAL HIGH (ref 70–99)
Potassium: 5.5 mmol/L — ABNORMAL HIGH (ref 3.5–5.1)
Sodium: 141 mmol/L (ref 135–145)
Total Bilirubin: 1 mg/dL (ref 0.0–1.2)
Total Protein: 6.1 g/dL — ABNORMAL LOW (ref 6.5–8.1)

## 2023-08-27 LAB — CBC WITH DIFFERENTIAL/PLATELET
Abs Immature Granulocytes: 0.07 10*3/uL (ref 0.00–0.07)
Basophils Absolute: 0 10*3/uL (ref 0.0–0.1)
Basophils Relative: 0 %
Eosinophils Absolute: 0 10*3/uL (ref 0.0–0.5)
Eosinophils Relative: 0 %
HCT: 40.3 % (ref 36.0–46.0)
Hemoglobin: 13.5 g/dL (ref 12.0–15.0)
Immature Granulocytes: 1 %
Lymphocytes Relative: 4 %
Lymphs Abs: 0.5 10*3/uL — ABNORMAL LOW (ref 0.7–4.0)
MCH: 30.5 pg (ref 26.0–34.0)
MCHC: 33.5 g/dL (ref 30.0–36.0)
MCV: 91 fL (ref 80.0–100.0)
Monocytes Absolute: 1.5 10*3/uL — ABNORMAL HIGH (ref 0.1–1.0)
Monocytes Relative: 11 %
Neutro Abs: 10.8 10*3/uL — ABNORMAL HIGH (ref 1.7–7.7)
Neutrophils Relative %: 84 %
Platelets: 148 10*3/uL — ABNORMAL LOW (ref 150–400)
RBC: 4.43 MIL/uL (ref 3.87–5.11)
RDW: 13.8 % (ref 11.5–15.5)
WBC: 12.8 10*3/uL — ABNORMAL HIGH (ref 4.0–10.5)
nRBC: 0 % (ref 0.0–0.2)

## 2023-08-27 LAB — URINALYSIS, ROUTINE W REFLEX MICROSCOPIC
Bilirubin Urine: NEGATIVE
Glucose, UA: NEGATIVE mg/dL
Ketones, ur: 5 mg/dL — AB
Leukocytes,Ua: NEGATIVE
Nitrite: NEGATIVE
Protein, ur: 30 mg/dL — AB
Specific Gravity, Urine: 1.012 (ref 1.005–1.030)
pH: 5 (ref 5.0–8.0)

## 2023-08-27 LAB — BLOOD GAS, VENOUS
Acid-base deficit: 9 mmol/L — ABNORMAL HIGH (ref 0.0–2.0)
Bicarbonate: 16.9 mmol/L — ABNORMAL LOW (ref 20.0–28.0)
O2 Saturation: 58.2 %
Patient temperature: 37
pCO2, Ven: 36 mmHg — ABNORMAL LOW (ref 44–60)
pH, Ven: 7.28 (ref 7.25–7.43)
pO2, Ven: 31 mmHg — CL (ref 32–45)

## 2023-08-27 LAB — CK: Total CK: 8825 U/L — ABNORMAL HIGH (ref 38–234)

## 2023-08-27 MED ORDER — POLYETHYLENE GLYCOL 3350 17 G PO PACK
17.0000 g | PACK | Freq: Two times a day (BID) | ORAL | Status: DC
  Administered 2023-08-27 – 2023-09-05 (×9): 17 g via ORAL
  Filled 2023-08-27 (×12): qty 1

## 2023-08-27 MED ORDER — SODIUM ZIRCONIUM CYCLOSILICATE 10 G PO PACK
10.0000 g | PACK | Freq: Once | ORAL | Status: AC
  Administered 2023-08-27: 10 g via ORAL
  Filled 2023-08-27: qty 1

## 2023-08-27 MED ORDER — CHLORHEXIDINE GLUCONATE CLOTH 2 % EX PADS
6.0000 | MEDICATED_PAD | Freq: Every day | CUTANEOUS | Status: DC
  Administered 2023-08-28 – 2023-09-05 (×9): 6 via TOPICAL

## 2023-08-27 MED ORDER — SENNOSIDES-DOCUSATE SODIUM 8.6-50 MG PO TABS
1.0000 | ORAL_TABLET | Freq: Two times a day (BID) | ORAL | Status: DC
  Administered 2023-08-27 – 2023-09-05 (×8): 1 via ORAL
  Filled 2023-08-27 (×12): qty 1

## 2023-08-27 MED ORDER — ONDANSETRON HCL 4 MG/2ML IJ SOLN: 4.0000 mg | Freq: Four times a day (QID) | INTRAMUSCULAR | Status: DC | PRN

## 2023-08-27 MED ORDER — ACETAMINOPHEN 650 MG RE SUPP: 650.0000 mg | Freq: Four times a day (QID) | RECTAL | Status: DC | PRN

## 2023-08-27 MED ORDER — LACTATED RINGERS IV BOLUS
2000.0000 mL | Freq: Once | INTRAVENOUS | Status: AC
  Administered 2023-08-27: 2000 mL via INTRAVENOUS

## 2023-08-27 MED ORDER — SODIUM CHLORIDE 0.9% FLUSH
3.0000 mL | Freq: Two times a day (BID) | INTRAVENOUS | Status: DC
  Administered 2023-08-27: 3 mL via INTRAVENOUS

## 2023-08-27 MED ORDER — LACTATED RINGERS IV BOLUS
1000.0000 mL | Freq: Once | INTRAVENOUS | Status: DC
Start: 2023-08-27 — End: 2023-08-27

## 2023-08-27 MED ORDER — ONDANSETRON HCL 4 MG PO TABS: 4.0000 mg | ORAL_TABLET | Freq: Four times a day (QID) | ORAL | Status: DC | PRN

## 2023-08-27 MED ORDER — ACETAMINOPHEN 325 MG PO TABS: 650.0000 mg | ORAL_TABLET | Freq: Four times a day (QID) | ORAL | Status: DC | PRN

## 2023-08-27 MED ORDER — SODIUM CHLORIDE 0.9 % IV SOLN: 250.0000 mL | INTRAVENOUS | Status: DC | PRN

## 2023-08-27 MED ORDER — HYDRALAZINE HCL 20 MG/ML IJ SOLN: 10.0000 mg | Freq: Four times a day (QID) | INTRAMUSCULAR | Status: DC | PRN

## 2023-08-27 MED ORDER — LACTATED RINGERS IV SOLN: INTRAVENOUS | Status: DC

## 2023-08-27 MED ORDER — LEVOTHYROXINE SODIUM 75 MCG PO TABS
75.0000 ug | ORAL_TABLET | Freq: Every day | ORAL | Status: DC
  Administered 2023-08-28 – 2023-08-30 (×3): 75 ug via ORAL
  Filled 2023-08-27 (×3): qty 1

## 2023-08-27 MED ORDER — LEVOTHYROXINE SODIUM 88 MCG PO TABS: 88.0000 ug | ORAL_TABLET | Freq: Every day | ORAL | Status: DC

## 2023-08-27 MED ORDER — SODIUM CHLORIDE 0.9% FLUSH: 3.0000 mL | INTRAVENOUS | Status: DC | PRN

## 2023-08-27 NOTE — ED Provider Notes (Signed)
 [facility] Provider Note  CSN: [id number] Arrival date & time: 08/27/23 1802  Chief Complaint(s) Fall and Altered Mental Status  HPI Tiffany Rice is a 89 y.o. female with PMH HTN, HLD, hypothyroidism, lacunar infarct currently living in assisted living who presents emergency room for evaluation of lethargy and a fall.  Patient reportedly had a fall yesterday but was able to get up on her own.  Facility states that she was unable to bear weight today but did not attempt to walk the patient.  Patient son endorsing decreased appetite and some mild cognitive decline with generalized lethargy.  Patient states that she had company this weekend and it wore her out.  Currently denying any symptoms including chest pain, shortness of breath, abdominal pain, nausea, vomiting or other systemic symptoms.  Patient is alert and answering all questions appropriately today.   Past Medical History Past Medical History:  Diagnosis Date   Closed fracture of humerus 08/05/2013   pt fell while walking down the street   Complex ovarian cyst 04/05/2009   Left side.  Followed by ultrasound.   Depression    Diverticulosis    Esophageal stricture    GERD (gastroesophageal reflux disease)    Hemorrhoids    Hiatal hernia    HTN (hypertension)    Hyperlipidemia    Hypothyroidism    Obesity    Osteopenia 04/05/2008   Stroke [facility])    Patient Active Problem List   Diagnosis Date Noted   Subdural hemorrhage (HCC) 08/27/2023   Acute kidney injury superimposed on chronic kidney disease (HCC) 08/27/2023   Rhabdomyolysis 08/27/2023   Hypercalcemia 08/27/2023   Acute urinary retention 08/27/2023   Recurrent falls 08/27/2023   Hyperkalemia 08/27/2023   Transaminitis 08/27/2023   History of CVA (cerebrovascular accident) 08/27/2023   Slurred speech 06/08/2022   Hypertensive emergency 06/08/2022   CVA (cerebral vascular accident) (HCC) 06/08/2022   Cyst of left  ovary 03/04/2017   Essential hypertension 02/15/2013   Hyperlipidemia 02/15/2013   Hypothyroidism 02/15/2013   CKD (chronic kidney disease) stage 2, GFR 60-89 ml/min 02/15/2013   Chest pain on exertion 02/15/2013   Home Medication(s) Prior to Admission medications   Medication Sig Start Date End Date Taking? Authorizing Provider  aspirin  EC 81 MG tablet Take 1 tablet (81 mg total) by mouth daily. Swallow whole. 06/11/22   Macdonald Savoy, MD  carvedilol  (COREG ) 6.25 MG tablet Take 1 tablet (6.25 mg total) by mouth 2 (two) times daily with a meal. 06/11/22   Macdonald Savoy, MD  Cholecalciferol (VITAMIN D-3 PO) Take 1 capsule by mouth daily.    [provider]  clopidogrel  (PLAVIX ) 75 MG tablet Take 75 mg by mouth daily. 07/07/22   [provider]  levothyroxine  (SYNTHROID , LEVOTHROID) 88 MCG tablet Take 88 mcg by mouth daily.    [provider]  olmesartan (BENICAR) 40 MG tablet Take 40 mg by mouth daily. 02/22/18   [provider]  potassium chloride (K-DUR) 10 MEQ tablet Take 10 mEq by mouth daily.    [provider]  rosuvastatin  (CRESTOR ) 20 MG tablet Take 1 tablet (20 mg total) by mouth daily. 06/11/22   Macdonald Savoy, MD  Past Surgical History Past Surgical History:  Procedure Laterality Date   CHOLECYSTECTOMY     MOHS SURGERY  1990   TUBAL LIGATION     Family History Family History  Problem Relation Age of Onset   Clotting disorder Father    Depression Father    Breast cancer Mother    Colon cancer Son    Colon polyps Daughter    Hyperlipidemia Son        x2    Hypertension Son        x 2   Hyperlipidemia Daughter     Social History Social History   Tobacco Use   Smoking status: Former    Types: Cigarettes   Smokeless tobacco: Never  Vaping Use   Vaping status: Never Used   Substance Use Topics   Alcohol use: No   Drug use: No   Allergies Bactrim [sulfamethoxazole-trimethoprim]  Review of Systems Review of Systems  All other systems reviewed and are negative.   Physical Exam Vital Signs  I have reviewed the triage vital signs BP 132/86   Pulse 70   Resp 19   SpO2 100%   Physical Exam Vitals and nursing note reviewed.  Constitutional:      General: She is not in acute distress.    Appearance: She is well-developed.  HENT:     Head: Normocephalic and atraumatic.  Eyes:     Conjunctiva/sclera: Conjunctivae normal.  Cardiovascular:     Rate and Rhythm: Normal rate and regular rhythm.     Heart sounds: No murmur heard. Pulmonary:     Effort: Pulmonary effort is normal. No respiratory distress.     Breath sounds: Normal breath sounds.  Abdominal:     Palpations: Abdomen is soft.     Tenderness: There is no abdominal tenderness.  Musculoskeletal:        General: No swelling.     Cervical back: Neck supple.  Skin:    General: Skin is warm and dry.     Capillary Refill: Capillary refill takes less than 2 seconds.  Neurological:     Mental Status: She is alert.  Psychiatric:        Mood and Affect: Mood normal.     ED Results and Treatments Labs (all labs ordered are listed, but only abnormal results are displayed) Labs Reviewed  COMPREHENSIVE METABOLIC PANEL WITH GFR - Abnormal; Notable for the following components:      Result Value   Potassium 5.5 (*)    CO2 17 (*)    Glucose, Bld 115 (*)    BUN 84 (*)    Creatinine, Ser 5.89 (*)    Calcium  10.5 (*)    Total Protein 6.1 (*)    Albumin 3.1 (*)    AST 317 (*)    ALT 160 (*)    GFR, Estimated 6 (*)    All other components within normal limits  CBC WITH DIFFERENTIAL/PLATELET - Abnormal; Notable for the following components:   WBC 12.8 (*)    Platelets 148 (*)    Neutro Abs 10.8 (*)    Lymphs Abs 0.5 (*)    Monocytes Absolute 1.5 (*)    All other components within normal  limits  CK - Abnormal; Notable for the following components:   Total CK 8,825 (*)    All other components within normal limits  URINALYSIS, ROUTINE W REFLEX MICROSCOPIC  BLOOD GAS, VENOUS  Radiology CT CHEST ABDOMEN PELVIS WO CONTRAST Result Date: 08/27/2023 CLINICAL DATA:  Blunt polytrauma, severe AKI, concern for obstructive uropathy, fall, altered mental status EXAM: CT CHEST, ABDOMEN AND PELVIS WITHOUT CONTRAST TECHNIQUE: Multidetector CT imaging of the chest, abdomen and pelvis was performed following the standard protocol without IV contrast. RADIATION DOSE REDUCTION: This exam was performed according to the departmental dose-optimization program which includes automated exposure control, adjustment of the mA and/or kV according to patient size and/or use of iterative reconstruction technique. COMPARISON:  Same day chest and hip radiographs. CT abdomen pelvis 02/02/2016 FINDINGS: CT CHEST FINDINGS Cardiovascular: Normal heart size. No pericardial effusion. Coronary artery and aortic atherosclerotic calcification. Assessment for vascular injury is limited without IV contrast. Mediastinum/Nodes: Moderate hiatal hernia. Trachea is unremarkable. No mediastinal hematoma. Lungs/Pleura: No focal consolidation, pleural effusion, or pneumothorax. 5 mm nodule in the right middle lobe along the minor fissure (series 5/image 65). Musculoskeletal: Chronic posttraumatic and degenerative changes about the right shoulder. No acute fracture CT ABDOMEN PELVIS FINDINGS Hepatobiliary: Cholecystectomy. Prominent intra and extrahepatic biliary ducts have slightly increased from 2017 likely due to reservoir effect. The common bile duct measures 13 mm, previously 10 mm. Evaluation for solid organ injury is limited with out IV contrast. Pancreas: Unremarkable. Spleen: Unremarkable. Adrenals/Urinary Tract:  Stable adrenal glands. No urinary calculi or hydronephrosis. Unremarkable bladder. Stomach/Bowel: Moderate hiatal hernia. Stomach is otherwise within normal limits. No bowel wall thickening or evidence of obstruction. Moderate stool ball in the rectum. Perirectal fat stranding. Vascular/Lymphatic: Aortic atherosclerosis. No enlarged abdominal or pelvic lymph nodes. Reproductive: Uterus and right ovary are unremarkable. Complex cystic lesion with thin septation in the left ovary measuring 6.6 cm. This measured 4.6 cm on 02/02/2016. Other: Trace free fluid in the pelvis.  No free intraperitoneal air. Musculoskeletal: No acute fracture IMPRESSION: 1. Moderate stool ball in the rectum with perirectal fat stranding, suggestive of fecal impaction developing stercoral colitis. 2. Complex cystic lesion with thin septation in the left ovary measuring 6.6 cm. This measured 4.6 cm on 02/02/2016. Given slow interval growth over 8 years this is likely benign however low-grade cystic neoplasm is not excluded. This could be followed with ultrasound in 6-12 months if clinically appropriate given comorbidities and age. 3. 5 mm nodule in the right middle lobe along the minor fissure. No follow-up needed if patient is low-risk. 4. Moderate hiatal hernia. 5. Aortic Atherosclerosis (ICD10-I70.0). Electronically Signed   By: Rozell Cornet M.D.   On: 08/27/2023 21:10   DG Hip Unilat W or Wo Pelvis 2-3 Views Right Result Date: 08/27/2023 CLINICAL DATA:  Marvell Slider, right hip pain EXAM: DG HIP (WITH OR WITHOUT PELVIS) 2-3V RIGHT COMPARISON:  08/27/2023 FINDINGS: Frontal view of the pelvis as well as frontal and cross-table lateral views of the right hip are obtained. There are no acute displaced fractures. Alignment is anatomic. Joint spaces are well preserved. Sacroiliac joints are normal. IMPRESSION: 1. Unremarkable pelvis and right hip.  No acute fracture. Electronically Signed   By: Bobbye Burrow M.D.   On: 08/27/2023 20:12   CT HEAD  WO CONTRAST ( ) Result Date: 08/27/2023 CLINICAL DATA:  Minor head trauma and neck trauma EXAM: CT HEAD WITHOUT CONTRAST CT CERVICAL SPINE WITHOUT CONTRAST TECHNIQUE: Multidetector CT imaging of the head and cervical spine was performed following the standard protocol without intravenous contrast. Multiplanar CT image reconstructions of the cervical spine were also generated. RADIATION DOSE REDUCTION: This exam was performed according to the departmental dose-optimization program which includes automated exposure control, adjustment  of the mA and/or kV according to patient size and/or use of iterative reconstruction technique. COMPARISON:  MRI head 06/09/2022 and CT head 06/08/2018 FINDINGS: CT HEAD FINDINGS Brain: Trace subdural hemorrhage along the posterior falx measuring 4 mm in thickness (series 3/image 14). No mass effect or midline shift. No evidence of acute infarct. No hydrocephalus. No extra-axial fluid collection. Age-commensurate cerebral atrophy and chronic small vessel ischemic disease. Vascular: No hyperdense vessel. Intracranial arterial calcification. 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 Skull: No fracture or focal lesion. Sinuses/Orbits: No acute finding. Other: None. CT CERVICAL SPINE FINDINGS Alignment: No evidence of traumatic listhesis. Skull base and vertebrae: No acute fracture. Soft tissues and spinal canal: No prevertebral fluid or swelling. No visible canal hematoma. Disc levels: Age-related multilevel spondylosis and facet arthropathy. No severe spinal canal narrowing. Upper chest: Frothy debris in the esophagus reaching above the thoracic inlet. Other: None. IMPRESSION: CT HEAD: 1. Trace subdural hemorrhage along the posterior falx measuring 4 mm in thickness. No mass effect or midline shift. 2. 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 TRAUMATIC BRAIN INJURY RISK STRATIFICATION Skull Fracture: No - Low/mBIG 1 Subdural Hematoma (SDH): 4mm to <85mm - mBIG 2  Subarachnoid Hemorrhage [facility]): No Epidural Hematoma (EDH): No - Low/mBIG 1 Cerebral contusion, intra-axial, intraparenchymal Hemorrhage (IPH): No Intraventricular Hemorrhage (IVH): No - Low/mBIG 1 Midline Shift > 1mm or Edema/effacement of sulci/vents: No - Low/mBIG 1 CT CERVICAL SPINE: 1. No acute fracture in the cervical spine. 2. Frothy debris in the esophagus reaching above the thoracic inlet, which is an aspiration risk. Critical Value/emergent results were called by telephone at the time of interpretation on 08/27/2023 at 7:22 pm to provider Atif Chapple [facility] , who verbally acknowledged these results. Electronically Signed   By: Rozell Cornet M.D.   On: 08/27/2023 19:27   CT CERVICAL SPINE WO CONTRAST Result Date: 08/27/2023 CLINICAL DATA:  Minor head trauma and neck trauma EXAM: CT HEAD WITHOUT CONTRAST CT CERVICAL SPINE WITHOUT CONTRAST TECHNIQUE: Multidetector CT imaging of the head and cervical spine was performed following the standard protocol without intravenous contrast. Multiplanar CT image reconstructions of the cervical spine were also generated. RADIATION DOSE REDUCTION: This exam was performed according to the departmental dose-optimization program which includes automated exposure control, adjustment of the mA and/or kV according to patient size and/or use of iterative reconstruction technique. COMPARISON:  MRI head 06/09/2022 and CT head 06/08/2018 FINDINGS: CT HEAD FINDINGS Brain: Trace subdural hemorrhage along the posterior falx measuring 4 mm in thickness (series 3/image 14). No mass effect or midline shift. No evidence of acute infarct. No hydrocephalus. No extra-axial fluid collection. Age-commensurate cerebral atrophy and chronic small vessel ischemic disease. Vascular: No hyperdense vessel. Intracranial arterial calcification. 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 Skull: No fracture or focal lesion. Sinuses/Orbits: No acute finding. Other: None. CT CERVICAL SPINE FINDINGS  Alignment: No evidence of traumatic listhesis. Skull base and vertebrae: No acute fracture. Soft tissues and spinal canal: No prevertebral fluid or swelling. No visible canal hematoma. Disc levels: Age-related multilevel spondylosis and facet arthropathy. No severe spinal canal narrowing. Upper chest: Frothy debris in the esophagus reaching above the thoracic inlet. Other: None. IMPRESSION: CT HEAD: 1. Trace subdural hemorrhage along the posterior falx measuring 4 mm in thickness. No mass effect or midline shift. 2. 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 TRAUMATIC BRAIN INJURY RISK STRATIFICATION Skull Fracture: No - Low/mBIG 1 Subdural Hematoma (SDH): 4mm to <40mm - mBIG 2 Subarachnoid Hemorrhage [facility]): No Epidural  Hematoma (EDH): No - Low/mBIG 1 Cerebral contusion, intra-axial, intraparenchymal Hemorrhage (IPH): No Intraventricular Hemorrhage (IVH): No - Low/mBIG 1 Midline Shift > 1mm or Edema/effacement of sulci/vents: No - Low/mBIG 1 CT CERVICAL SPINE: 1. No acute fracture in the cervical spine. 2. Frothy debris in the esophagus reaching above the thoracic inlet, which is an aspiration risk. Critical Value/emergent results were called by telephone at the time of interpretation on 08/27/2023 at 7:22 pm to provider Traniya Prichett [facility] , who verbally acknowledged these results. Electronically Signed   By: Rozell Cornet M.D.   On: 08/27/2023 19:27   DG Chest Portable 1 View Result Date: 08/27/2023 CLINICAL DATA:  Fall, concern for fracture. EXAM: PORTABLE CHEST 1 VIEW, PORTABLE PELVIS COMPARISON:  None Available. FINDINGS: Chest: The heart size and mediastinal contours are within normal limits. There is atherosclerotic calcification of the aorta. Minimal atelectasis is noted at the left lung base. There is mild scarring at the right lung apex. No consolidation, effusion, or pneumothorax is seen. Surgical clips are noted in the right upper quadrant. No acute osseous abnormality is seen. Pelvis: No acute  pelvic fracture is seen. Overlapping structures are noted at the right femoral head and the possibility of right hip fracture cannot be excluded. There is no dislocation. Mild degenerative changes are noted at the hips bilaterally and lower lumbar spine. IMPRESSION: 1. No active disease in the chest. 2. No acute pelvic fracture. 3. Overlapping structures are present at the right hip and the possibility of subcapital femoral fracture cannot be excluded. Dedicated images of the right hip are recommended. Electronically Signed   By: Wyvonnia Heimlich M.D.   On: 08/27/2023 18:56   DG Pelvis Portable Result Date: 08/27/2023 CLINICAL DATA:  Fall, concern for fracture. EXAM: PORTABLE CHEST 1 VIEW, PORTABLE PELVIS COMPARISON:  None Available. FINDINGS: Chest: The heart size and mediastinal contours are within normal limits. There is atherosclerotic calcification of the aorta. Minimal atelectasis is noted at the left lung base. There is mild scarring at the right lung apex. No consolidation, effusion, or pneumothorax is seen. Surgical clips are noted in the right upper quadrant. No acute osseous abnormality is seen. Pelvis: No acute pelvic fracture is seen. Overlapping structures are noted at the right femoral head and the possibility of right hip fracture cannot be excluded. There is no dislocation. Mild degenerative changes are noted at the hips bilaterally and lower lumbar spine. IMPRESSION: 1. No active disease in the chest. 2. No acute pelvic fracture. 3. Overlapping structures are present at the right hip and the possibility of subcapital femoral fracture cannot be excluded. Dedicated images of the right hip are recommended. Electronically Signed   By: Wyvonnia Heimlich M.D.   On: 08/27/2023 18:56    Pertinent labs & imaging results that were available during my care of the patient were reviewed by me and considered in my medical decision making (see MDM for details).  Medications Ordered in ED Medications  lactated  ringers bolus 2,000 mL (has no administration in time range)  lactated ringers infusion (has no administration in time range)  Procedures .Critical Care  Performed by: Karlyn Overman, MD Authorized by: Karlyn Overman, MD   Critical care provider statement:    Critical care time (minutes):  30   Critical care was necessary to treat or prevent imminent or life-threatening deterioration of the following conditions:  Renal failure   Critical care was time spent personally by me on the following activities:  Development of treatment plan with patient or surrogate, discussions with consultants, evaluation of patient's response to treatment, examination of patient, ordering and review of laboratory studies, ordering and review of radiographic studies, ordering and performing treatments and interventions, pulse oximetry, re-evaluation of patient's condition and review of old charts   (including critical care time)  Medical Decision Making / ED Course   This patient presents to the ED for concern of fall, weakness, this involves an extensive number of treatment options, and is a complaint that carries with it a high risk of complications and morbidity.  The differential diagnosis includes fracture, contusion, hematoma, ligamentous injury, closed head injury, ICH, laceration, intrathoracic injury, intra-abdominal injury  MDM: Patient seen emergency room for evaluation of a fall.  Physical exam with an old bruise over the temple on the left, dry tacky mucous membranes but is otherwise unremarkable.  Initial trauma imaging including CT C-spine, chest x-ray, pelvis x-ray and hip x-ray reassuringly negative for acute traumatic injury.  CT head is showing a small acute subdural hematoma and I spoke with the neurosurgical nurse practitioner Maritza Sidles who is recommending 6-hour  head CT and no intervention unless significantly worsening on repeat CT.  Does recommend holding Plavix  for 2 weeks.  After trauma imaging returned, laboratory evaluation resulted and patient has a leukocytosis of 12.8 but chemistry is quite concerning with a new severe AKI with a BUN of 84, creatinine 5.89, CO2 17, CK is 8825, AST 317, ALT 160.  I spoke with the nephrologist on-call Dr. Ansel Kingdom is recommending Foley catheter placement, aggressive hydration and consideration of bicarb if fluids are not improving acidosis overnight.  Patient sent back for completion of trauma imaging with a chest abdomen pelvis that shows a moderate stool ball in the rectum as well as an ovarian cystic lesion and what appears to be large volume urine in the bladder.  Presentation consistent with rhabdomyolysis and acute kidney injury.  Foley catheter placed and patient will require hospital admission for severe acute kidney injury.  Patient admitted   Additional history obtained: -Additional history obtained from son -External records from outside source obtained and reviewed including: Chart review including previous notes, labs, imaging, consultation notes   Lab Tests: -I ordered, reviewed, and interpreted labs.   The pertinent results include:   Labs Reviewed  COMPREHENSIVE METABOLIC PANEL WITH GFR - Abnormal; Notable for the following components:      Result Value   Potassium 5.5 (*)    CO2 17 (*)    Glucose, Bld 115 (*)    BUN 84 (*)    Creatinine, Ser 5.89 (*)    Calcium  10.5 (*)    Total Protein 6.1 (*)    Albumin 3.1 (*)    AST 317 (*)    ALT 160 (*)    GFR, Estimated 6 (*)    All other components within normal limits  CBC WITH DIFFERENTIAL/PLATELET - Abnormal; Notable for the following components:   WBC 12.8 (*)    Platelets 148 (*)    Neutro Abs 10.8 (*)    Lymphs Abs 0.5 (*)    Monocytes Absolute  1.5 (*)    All other components within normal limits  CK - Abnormal; Notable for the  following components:   Total CK 8,825 (*)    All other components within normal limits  URINALYSIS, ROUTINE W REFLEX MICROSCOPIC  BLOOD GAS, VENOUS      EKG   EKG Interpretation Date/Time:  Saturday Aug 27 2023 18:10:05 EDT Ventricular Rate:  70 PR Interval:  137 QRS Duration:  99 QT Interval:  398 QTC Calculation: 430 R Axis:   24  Text Interpretation: Sinus rhythm Low voltage, precordial leads RSR' in V1 or V2, right VCD or RVH Confirmed by Analiza Cowger ([id number]) on 08/27/2023 7:57:56 PM         Imaging Studies ordered: I ordered imaging studies including CT head, C-spine, chest abdomen pelvis, chest x-ray, pelvis x-ray, hip x-ray I independently visualized and interpreted imaging. I agree with the radiologist interpretation   Medicines ordered and prescription drug management: Meds ordered this encounter  Medications   DISCONTD: lactated ringers bolus 1,000 mL   lactated ringers bolus 2,000 mL   lactated ringers infusion    -I have reviewed the patients home medicines and have made adjustments as needed  Critical interventions Aggressive fluid hydration  Consultations Obtained: I requested consultation with the neurosurgery NP on-call, nephrologist on-call,  and discussed lab and imaging findings as well as pertinent plan - they recommend: Repeat 6-hour head CT, aggressive hydration   Cardiac Monitoring: The patient was maintained on a cardiac monitor.  I personally viewed and interpreted the cardiac monitored which showed an underlying rhythm of: NSR  Social Determinants of Health:  Factors impacting patients care include: Lives in assisted living facility   Reevaluation: After the interventions noted above, I reevaluated the patient and found that they have :improved  Co morbidities that complicate the patient evaluation  Past Medical History:  Diagnosis Date   Closed fracture of humerus 08/05/2013   pt fell while walking down the street   Complex  ovarian cyst 04/05/2009   Left side.  Followed by ultrasound.   Depression    Diverticulosis    Esophageal stricture    GERD (gastroesophageal reflux disease)    Hemorrhoids    Hiatal hernia    HTN (hypertension)    Hyperlipidemia    Hypothyroidism    Obesity    Osteopenia 04/05/2008   Stroke [facility])       Dispostion: I considered admission for this patient, and patient require hospital admission for severe AKI and rhabdomyolysis     Final Clinical Impression(s) / ED Diagnoses Final diagnoses:  None     @PCDICTATION @    Karlyn Overman, MD 08/27/23 2124

## 2023-08-27 NOTE — ED Notes (Signed)
 Patient transported to CT

## 2023-08-27 NOTE — ED Notes (Signed)
 Pt defecated on herself in diaper; pt cleaned and placed on the bedpan Pt unable to urinate Pt placed in clean brief

## 2023-08-27 NOTE — ED Triage Notes (Signed)
 Patient BIB [facility] from [facility] after a fall yesterday, patient had no complaints after fall, today patient is "unable to bear weight" per facility, patient did not attempt to bear weight at facility but facility reports she is usually able to ambulate and has not today. Son reported FTT symptoms (decreased appetite, falls, cognitive decline). BP 110/62 HR 73 97% RA CBG 120 RR 16

## 2023-08-27 NOTE — H&P (Signed)
 History and Physical    Tiffany Rice:[id number] DOB: 07/10/1931 DOA: 08/27/2023  PCP: Amin, Saad, MD   Patient coming from: ALF   Chief Complaint:  Chief Complaint  Patient presents with   Fall   Altered Mental Status   ED TRIAGE note:Patient BIB [facility] from [facility] after a fall yesterday, patient had no complaints after fall, today patient is "unable to bear weight" per facility, patient did not attempt to bear weight at facility but facility reports she is usually able to ambulate and has not today. Son reported FTT symptoms (decreased appetite, falls, cognitive decline). BP 110/62 HR 73 97% RA CBG 120 RR 16  HPI:  Tiffany Rice is a 89 y.o. female with medical history significant of CVA on Plavix , CKD stage II, hypothyroidism, essential hypertension, hyperlipidemia and hypothyroidism presented emergency department complaining for fall and since the fall patient is unable to bear weight.  At baseline patient ambulates.  Patient is also complaining about decreased appetite since the fall and cognitive decline.  Family reported that over the course of last 1 week patient has more generalized weakness and that causing recurrent fall. During my evaluation at the bedside patient is alert oriented x 4.  Patient's son at the bedside reported that since fluid has been started patient is looking much better and hydrated.  Patient denies any headache, blurry vision, nausea, chest pain, abdominal pain, diarrhea and urinary symptoms.  Reporting ongoing constipation.   ED Course:  At presentation to ED O2 sat 73% on room air currently 100% on room air.  Initial heart rate about 43 which has been improved to 70.  Otherwise hemodynamically stable. EKG showed normal sinus rhythm heart rate 73. Pending VBG. Elevated CK around 9000. CBC showing leukocytosis 12.8, low platelet count 148 and normal H&H. CMP showing elevated potassium 5.5, elevated creatinine 5.89,  slightly elevated calcium  10.5 elevated AST/ALT, normal alkaline phosphatase and bilirubin level. Pending UA.  Chest x-ray no active disease process. X-ray of the pelvis possible subcapital femoral fracture which cannot be excluded.  Following CT hip ruled out any hip fracture.  CT head showed trace subdural hemorrhage along the posterior falx measuring 4 mm in thickness. No mass effect or midline shift. 2. 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    CT cervical spine no evidence of fracture. CT chest abdomen pelvis moderate stool burden, complex cyst of the of the left ovary-low-grade cystic neoplasm is not excluded, right middle lobe 5 mm nodule, moderate hiatal hernia and aortic atherosclerosis.  In the ED patient has been treated with 2 L of LR bolus and currently on LR 100 cc/h.  ED physician spoke with neurosurgery PA Darryl Endow who recommended repeat head CT in 6 hours if the subdural hemorrhage is increasing reach out to neurosurgery again versus if this is stable hold the Plavix  for 2 weeks and reach out in the morning.  ED physician also reported patient has significant urinary retention and planning to place a Foley catheter.  In the rectal vault patient has stool burden and CT abdomen pelvis was obtained after patient has a large bowel movement which is showing still large stool burden. Nephrology Dr. Ansel Kingdom recommended place a Foley catheter, IV hydration and if renal function does not improve reach out to nephrology for formal consult.   Hospitalist has been consulted for management of subdural hemorrhage, rhabdomyolysis, acute kidney injury, transaminitis, recurrent fall, acute kidney retention and constipation.   Significant labs in  the ED: Lab Orders         Comprehensive metabolic panel         CBC with Differential         CK         Urinalysis, Routine w reflex microscopic -Urine, Clean Catch         Blood gas, venous (at [facility] and [facility])          CK         CBC         Comprehensive metabolic panel         Hepatitis panel, acute         Protime-INR         APTT         Basic metabolic panel with GFR       Review of Systems:  Review of Systems  All other systems reviewed and are negative.   Past Medical History:  Diagnosis Date   Closed fracture of humerus 08/05/2013   pt fell while walking down the street   Complex ovarian cyst 04/05/2009   Left side.  Followed by ultrasound.   Depression    Diverticulosis    Esophageal stricture    GERD (gastroesophageal reflux disease)    Hemorrhoids    Hiatal hernia    HTN (hypertension)    Hyperlipidemia    Hypothyroidism    Obesity    Osteopenia 04/05/2008   Stroke [facility])     Past Surgical History:  Procedure Laterality Date   CHOLECYSTECTOMY     MOHS SURGERY  1990   TUBAL LIGATION       reports that she has quit smoking. Her smoking use included cigarettes. She has never used smokeless tobacco. She reports that she does not drink alcohol and does not use drugs.  Allergies  Allergen Reactions   Bactrim [Sulfamethoxazole-Trimethoprim] Other (See Comments)    Mood swings     Family History  Problem Relation Age of Onset   Clotting disorder Father    Depression Father    Breast cancer Mother    Colon cancer Son    Colon polyps Daughter    Hyperlipidemia Son        x2    Hypertension Son        x 2   Hyperlipidemia Daughter     Prior to Admission medications   Medication Sig Start Date End Date Taking? Authorizing Provider  aspirin  EC 81 MG tablet Take 1 tablet (81 mg total) by mouth daily. Swallow whole. 06/11/22   Macdonald Savoy, MD  carvedilol  (COREG ) 6.25 MG tablet Take 1 tablet (6.25 mg total) by mouth 2 (two) times daily with a meal. 06/11/22   Macdonald Savoy, MD  Cholecalciferol (VITAMIN D-3 PO) Take 1 capsule by mouth daily.    [provider]  clopidogrel  (PLAVIX ) 75 MG tablet Take 75 mg by mouth daily. 07/07/22   [provider]  levothyroxine  (SYNTHROID , LEVOTHROID) 88 MCG tablet Take 88 mcg by mouth daily.    [provider]  olmesartan (BENICAR) 40 MG tablet Take 40 mg by mouth daily. 02/22/18   [provider]  potassium chloride (K-DUR) 10 MEQ tablet Take 10 mEq by mouth daily.    [provider]  rosuvastatin  (CRESTOR ) 20 MG tablet Take 1 tablet (20 mg total) by mouth daily. 06/11/22   Macdonald Savoy, MD     Physical Exam: Vitals:   08/27/23 2336 08/28/23 0030 08/28/23  0045 08/28/23 0100  BP:  126/64 (!) 121/59 134/68  Pulse:  79 82 84  Resp:  20 (!) 24 (!) 26  Temp: 98.3 F (36.8 C)     TempSrc: Oral     SpO2:  100% 100% 100%  Weight: 73.5 kg     Height: 5\' 6"  (1.676 m)       Physical Exam Vitals and nursing note reviewed.  Constitutional:      General: She is not in acute distress.    Appearance: She is not ill-appearing.  HENT:     Mouth/Throat:     Mouth: Mucous membranes are dry.  Cardiovascular:     Rate and Rhythm: Normal rate and regular rhythm.     Pulses: Normal pulses.     Heart sounds: Normal heart sounds.  Pulmonary:     Effort: Pulmonary effort is normal.     Breath sounds: Normal breath sounds.  Abdominal:     General: There is no distension.     Palpations: Abdomen is soft.     Tenderness: There is no abdominal tenderness.  Musculoskeletal:        General: No swelling or deformity.     Cervical back: Neck supple.     Right lower leg: No edema.     Left lower leg: No edema.  Skin:    Capillary Refill: Capillary refill takes less than 2 seconds.  Neurological:     Mental Status: She is alert and oriented to person, place, and time.  Psychiatric:        Mood and Affect: Mood normal.      Labs on Admission: I have personally reviewed following labs and imaging studies  CBC: Recent Labs  Lab 08/27/23 1830  WBC 12.8*  NEUTROABS 10.8*  HGB 13.5  HCT 40.3  MCV 91.0  PLT 148*   Basic Metabolic Panel: Recent Labs   Lab 08/27/23 1830  NA 141  K 5.5*  CL 110  CO2 17*  GLUCOSE 115*  BUN 84*  CREATININE 5.89*  CALCIUM  10.5*   GFR: Estimated Creatinine Clearance: 6.6 mL/min (A) (by C-G formula based on SCr of 5.89 mg/dL (H)). Liver Function Tests: Recent Labs  Lab 08/27/23 1830  AST 317*  ALT 160*  ALKPHOS 60  BILITOT 1.0  PROT 6.1*  ALBUMIN 3.1*   No results for input(s): "LIPASE", "AMYLASE" in the last 168 hours. No results for input(s): "AMMONIA" in the last 168 hours. Coagulation Profile: No results for input(s): "INR", "PROTIME" in the last 168 hours. Cardiac Enzymes: Recent Labs  Lab 08/27/23 1830  CKTOTAL 8,825*   BNP (last 3 results) No results for input(s): "BNP" in the last 8760 hours. HbA1C: No results for input(s): "HGBA1C" in the last 72 hours. CBG: No results for input(s): "GLUCAP" in the last 168 hours. Lipid Profile: No results for input(s): "CHOL", "HDL", "LDLCALC", "TRIG", "CHOLHDL", "LDLDIRECT" in the last 72 hours. Thyroid  Function Tests: No results for input(s): "TSH", "T4TOTAL", "FREET4", "T3FREE", "THYROIDAB" in the last 72 hours. Anemia Panel: No results for input(s): "VITAMINB12", "FOLATE", "FERRITIN", "TIBC", "IRON", "RETICCTPCT" in the last 72 hours. Urine analysis:    Component Value Date/Time   COLORURINE YELLOW 08/27/2023 1813   APPEARANCEUR CLEAR 08/27/2023 1813   LABSPEC 1.012 08/27/2023 1813   PHURINE 5.0 08/27/2023 1813   GLUCOSEU NEGATIVE 08/27/2023 1813   HGBUR LARGE (A) 08/27/2023 1813   BILIRUBINUR NEGATIVE 08/27/2023 1813   KETONESUR 5 (A) 08/27/2023 1813   PROTEINUR 30 (A) 08/27/2023 1813  NITRITE NEGATIVE 08/27/2023 1813   LEUKOCYTESUR NEGATIVE 08/27/2023 1813    Radiological Exams on Admission: I have personally reviewed images CT HEAD WO CONTRAST ( ) Result Date: 08/28/2023 CLINICAL DATA:  Head trauma, minor (Age >= 65y) EXAM: CT HEAD WITHOUT CONTRAST TECHNIQUE: Contiguous axial images were obtained from the base of the  skull through the vertex without intravenous contrast. RADIATION DOSE REDUCTION: This exam was performed according to the departmental dose-optimization program which includes automated exposure control, adjustment of the mA and/or kV according to patient size and/or use of iterative reconstruction technique. COMPARISON:  CT head 08/27/2023 FINDINGS: Brain: Cerebral ventricle sizes are concordant with the degree of cerebral volume loss. Patchy and confluent areas of decreased attenuation are noted throughout the deep and periventricular white matter of the cerebral hemispheres bilaterally, compatible with chronic microvascular ischemic disease. No evidence of large-territorial acute infarction. No parenchymal hemorrhage. No mass lesion. Stable 3 mm right posterior falx cerebri subdural hematoma. No mass effect or midline shift. No hydrocephalus. Basilar cisterns are patent. Vascular: No hyperdense vessel. Atherosclerotic calcifications are present within the cavernous internal carotid and vertebral arteries. Skull: No acute fracture or focal lesion. Sinuses/Orbits: Paranasal sinuses and mastoid air cells are clear. The orbits are unremarkable. Other: None. IMPRESSION: 1. Stable 3 mm right posterior falx cerebri subdural hematoma. 2. No changes from prior CT head 08/27/2023. Electronically Signed   By: Morgane  Naveau M.D.   On: 08/28/2023 01:52   CT CHEST ABDOMEN PELVIS WO CONTRAST Result Date: 08/27/2023 CLINICAL DATA:  Blunt polytrauma, severe AKI, concern for obstructive uropathy, fall, altered mental status EXAM: CT CHEST, ABDOMEN AND PELVIS WITHOUT CONTRAST TECHNIQUE: Multidetector CT imaging of the chest, abdomen and pelvis was performed following the standard protocol without IV contrast. RADIATION DOSE REDUCTION: This exam was performed according to the departmental dose-optimization program which includes automated exposure control, adjustment of the mA and/or kV according to patient size and/or use of  iterative reconstruction technique. COMPARISON:  Same day chest and hip radiographs. CT abdomen pelvis 02/02/2016 FINDINGS: CT CHEST FINDINGS Cardiovascular: Normal heart size. No pericardial effusion. Coronary artery and aortic atherosclerotic calcification. Assessment for vascular injury is limited without IV contrast. Mediastinum/Nodes: Moderate hiatal hernia. Trachea is unremarkable. No mediastinal hematoma. Lungs/Pleura: No focal consolidation, pleural effusion, or pneumothorax. 5 mm nodule in the right middle lobe along the minor fissure (series 5/image 65). Musculoskeletal: Chronic posttraumatic and degenerative changes about the right shoulder. No acute fracture CT ABDOMEN PELVIS FINDINGS Hepatobiliary: Cholecystectomy. Prominent intra and extrahepatic biliary ducts have slightly increased from 2017 likely due to reservoir effect. The common bile duct measures 13 mm, previously 10 mm. Evaluation for solid organ injury is limited with out IV contrast. Pancreas: Unremarkable. Spleen: Unremarkable. Adrenals/Urinary Tract: Stable adrenal glands. No urinary calculi or hydronephrosis. Unremarkable bladder. Stomach/Bowel: Moderate hiatal hernia. Stomach is otherwise within normal limits. No bowel wall thickening or evidence of obstruction. Moderate stool ball in the rectum. Perirectal fat stranding. Vascular/Lymphatic: Aortic atherosclerosis. No enlarged abdominal or pelvic lymph nodes. Reproductive: Uterus and right ovary are unremarkable. Complex cystic lesion with thin septation in the left ovary measuring 6.6 cm. This measured 4.6 cm on 02/02/2016. Other: Trace free fluid in the pelvis.  No free intraperitoneal air. Musculoskeletal: No acute fracture IMPRESSION: 1. Moderate stool ball in the rectum with perirectal fat stranding, suggestive of fecal impaction developing stercoral colitis. 2. Complex cystic lesion with thin septation in the left ovary measuring 6.6 cm. This measured 4.6 cm on 02/02/2016. Given  slow interval  growth over 8 years this is likely benign however low-grade cystic neoplasm is not excluded. This could be followed with ultrasound in 6-12 months if clinically appropriate given comorbidities and age. 3. 5 mm nodule in the right middle lobe along the minor fissure. No follow-up needed if patient is low-risk. 4. Moderate hiatal hernia. 5. Aortic Atherosclerosis (ICD10-I70.0). Electronically Signed   By: Rozell Cornet M.D.   On: 08/27/2023 21:10   DG Hip Unilat W or Wo Pelvis 2-3 Views Right Result Date: 08/27/2023 CLINICAL DATA:  Marvell Slider, right hip pain EXAM: DG HIP (WITH OR WITHOUT PELVIS) 2-3V RIGHT COMPARISON:  08/27/2023 FINDINGS: Frontal view of the pelvis as well as frontal and cross-table lateral views of the right hip are obtained. There are no acute displaced fractures. Alignment is anatomic. Joint spaces are well preserved. Sacroiliac joints are normal. IMPRESSION: 1. Unremarkable pelvis and right hip.  No acute fracture. Electronically Signed   By: Bobbye Burrow M.D.   On: 08/27/2023 20:12   CT HEAD WO CONTRAST ( ) Result Date: 08/27/2023 CLINICAL DATA:  Minor head trauma and neck trauma EXAM: CT HEAD WITHOUT CONTRAST CT CERVICAL SPINE WITHOUT CONTRAST TECHNIQUE: Multidetector CT imaging of the head and cervical spine was performed following the standard protocol without intravenous contrast. Multiplanar CT image reconstructions of the cervical spine were also generated. RADIATION DOSE REDUCTION: This exam was performed according to the departmental dose-optimization program which includes automated exposure control, adjustment of the mA and/or kV according to patient size and/or use of iterative reconstruction technique. COMPARISON:  MRI head 06/09/2022 and CT head 06/08/2018 FINDINGS: CT HEAD FINDINGS Brain: Trace subdural hemorrhage along the posterior falx measuring 4 mm in thickness (series 3/image 14). No mass effect or midline shift. No evidence of acute infarct. No  hydrocephalus. No extra-axial fluid collection. Age-commensurate cerebral atrophy and chronic small vessel ischemic disease. Vascular: No hyperdense vessel. Intracranial arterial calcification. 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 Skull: No fracture or focal lesion. Sinuses/Orbits: No acute finding. Other: None. CT CERVICAL SPINE FINDINGS Alignment: No evidence of traumatic listhesis. Skull base and vertebrae: No acute fracture. Soft tissues and spinal canal: No prevertebral fluid or swelling. No visible canal hematoma. Disc levels: Age-related multilevel spondylosis and facet arthropathy. No severe spinal canal narrowing. Upper chest: Frothy debris in the esophagus reaching above the thoracic inlet. Other: None. IMPRESSION: CT HEAD: 1. Trace subdural hemorrhage along the posterior falx measuring 4 mm in thickness. No mass effect or midline shift. 2. 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 TRAUMATIC BRAIN INJURY RISK STRATIFICATION Skull Fracture: No - Low/mBIG 1 Subdural Hematoma (SDH): 4mm to <75mm - mBIG 2 Subarachnoid Hemorrhage [facility]): No Epidural Hematoma (EDH): No - Low/mBIG 1 Cerebral contusion, intra-axial, intraparenchymal Hemorrhage (IPH): No Intraventricular Hemorrhage (IVH): No - Low/mBIG 1 Midline Shift > 1mm or Edema/effacement of sulci/vents: No - Low/mBIG 1 CT CERVICAL SPINE: 1. No acute fracture in the cervical spine. 2. Frothy debris in the esophagus reaching above the thoracic inlet, which is an aspiration risk. Critical Value/emergent results were called by telephone at the time of interpretation on 08/27/2023 at 7:22 pm to provider MADISON [facility] , who verbally acknowledged these results. Electronically Signed   By: Rozell Cornet M.D.   On: 08/27/2023 19:27   CT CERVICAL SPINE WO CONTRAST Result Date: 08/27/2023 CLINICAL DATA:  Minor head trauma and neck trauma EXAM: CT HEAD WITHOUT CONTRAST CT CERVICAL SPINE WITHOUT CONTRAST TECHNIQUE: Multidetector CT imaging of the  head and cervical spine was  performed following the standard protocol without intravenous contrast. Multiplanar CT image reconstructions of the cervical spine were also generated. RADIATION DOSE REDUCTION: This exam was performed according to the departmental dose-optimization program which includes automated exposure control, adjustment of the mA and/or kV according to patient size and/or use of iterative reconstruction technique. COMPARISON:  MRI head 06/09/2022 and CT head 06/08/2018 FINDINGS: CT HEAD FINDINGS Brain: Trace subdural hemorrhage along the posterior falx measuring 4 mm in thickness (series 3/image 14). No mass effect or midline shift. No evidence of acute infarct. No hydrocephalus. No extra-axial fluid collection. Age-commensurate cerebral atrophy and chronic small vessel ischemic disease. Vascular: No hyperdense vessel. Intracranial arterial calcification. 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 Skull: No fracture or focal lesion. Sinuses/Orbits: No acute finding. Other: None. CT CERVICAL SPINE FINDINGS Alignment: No evidence of traumatic listhesis. Skull base and vertebrae: No acute fracture. Soft tissues and spinal canal: No prevertebral fluid or swelling. No visible canal hematoma. Disc levels: Age-related multilevel spondylosis and facet arthropathy. No severe spinal canal narrowing. Upper chest: Frothy debris in the esophagus reaching above the thoracic inlet. Other: None. IMPRESSION: CT HEAD: 1. Trace subdural hemorrhage along the posterior falx measuring 4 mm in thickness. No mass effect or midline shift. 2. 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 TRAUMATIC BRAIN INJURY RISK STRATIFICATION Skull Fracture: No - Low/mBIG 1 Subdural Hematoma (SDH): 4mm to <31mm - mBIG 2 Subarachnoid Hemorrhage [facility]): No Epidural Hematoma (EDH): No - Low/mBIG 1 Cerebral contusion, intra-axial, intraparenchymal Hemorrhage (IPH): No Intraventricular Hemorrhage (IVH): No - Low/mBIG 1 Midline Shift  > 1mm or Edema/effacement of sulci/vents: No - Low/mBIG 1 CT CERVICAL SPINE: 1. No acute fracture in the cervical spine. 2. Frothy debris in the esophagus reaching above the thoracic inlet, which is an aspiration risk. Critical Value/emergent results were called by telephone at the time of interpretation on 08/27/2023 at 7:22 pm to provider MADISON [facility] , who verbally acknowledged these results. Electronically Signed   By: Rozell Cornet M.D.   On: 08/27/2023 19:27   DG Chest Portable 1 View Result Date: 08/27/2023 CLINICAL DATA:  Fall, concern for fracture. EXAM: PORTABLE CHEST 1 VIEW, PORTABLE PELVIS COMPARISON:  None Available. FINDINGS: Chest: The heart size and mediastinal contours are within normal limits. There is atherosclerotic calcification of the aorta. Minimal atelectasis is noted at the left lung base. There is mild scarring at the right lung apex. No consolidation, effusion, or pneumothorax is seen. Surgical clips are noted in the right upper quadrant. No acute osseous abnormality is seen. Pelvis: No acute pelvic fracture is seen. Overlapping structures are noted at the right femoral head and the possibility of right hip fracture cannot be excluded. There is no dislocation. Mild degenerative changes are noted at the hips bilaterally and lower lumbar spine. IMPRESSION: 1. No active disease in the chest. 2. No acute pelvic fracture. 3. Overlapping structures are present at the right hip and the possibility of subcapital femoral fracture cannot be excluded. Dedicated images of the right hip are recommended. Electronically Signed   By: Wyvonnia Heimlich M.D.   On: 08/27/2023 18:56   DG Pelvis Portable Result Date: 08/27/2023 CLINICAL DATA:  Fall, concern for fracture. EXAM: PORTABLE CHEST 1 VIEW, PORTABLE PELVIS COMPARISON:  None Available. FINDINGS: Chest: The heart size and mediastinal contours are within normal limits. There is atherosclerotic calcification of the aorta. Minimal atelectasis is noted  at the left lung base. There is mild scarring at the right lung apex. No consolidation, effusion,  or pneumothorax is seen. Surgical clips are noted in the right upper quadrant. No acute osseous abnormality is seen. Pelvis: No acute pelvic fracture is seen. Overlapping structures are noted at the right femoral head and the possibility of right hip fracture cannot be excluded. There is no dislocation. Mild degenerative changes are noted at the hips bilaterally and lower lumbar spine. IMPRESSION: 1. No active disease in the chest. 2. No acute pelvic fracture. 3. Overlapping structures are present at the right hip and the possibility of subcapital femoral fracture cannot be excluded. Dedicated images of the right hip are recommended. Electronically Signed   By: Wyvonnia Heimlich M.D.   On: 08/27/2023 18:56     EKG: My personal interpretation of EKG shows: Normal sinus rhythm heart rate 73.  There is no ST-T wave abnormality.    Assessment/Plan: Principal Problem:   Subdural hemorrhage (HCC) Active Problems:   Acute kidney injury superimposed on chronic kidney disease (HCC)   Rhabdomyolysis   Hypercalcemia   Acute urinary retention   Essential hypertension   Hyperlipidemia   Hypothyroidism   Recurrent falls   Hyperkalemia   Transaminitis   History of CVA (cerebrovascular accident)    Assessment and Plan: Subdural hematoma -Patient presented emergency department with complaining of recurrent fall and generalized weakness over the course of last 1 week. -Presentation to ED hemodynamically stable. - CMP showing elevated potassium 5.5, low bicarb 17, creatinine 5.89, elevated calcium  10.5 - Extensive imaging has been done in the ED. -Chest x-ray, x-ray pelvis, CT cervical spine and CT chest abdomen pelvis no acute abnormality.  However CT head showed subdural hematoma. - CT head showed trace subdural hematoma along the posterior falx measuring 4 mm thickness.  No mass effect or midline shift. -ED  physician discussed case with on-call neurosurgery who recommended repeat head CT scan in 6 hours.  If there is extension of the bleeding need to reach out to neurosurgery for further recommendation versus if it is stable in that case need to hold the Plavix  and aspirin  for 2 weeks and call neurosurgery in the daytime again. - Repeat head CT at 1:22 AM.  Will follow-up with the result. -Need to continue fall precaution and strict bedrest. Addendum - Repeat head CT scan showing stable  3 mm right posterior falx cerebri subdural hematoma.  Please reach out to neurosurgery in the daytime regarding further care.   Unsteady gait and recurrent fall -At baseline patient uses walker however recently patient is having more cognitive decline, unsteady gait and recurrent fall. - Given patient has subdural hematoma at this time continue strict fall precaution and bedrest.  If repeat head CT scan shows stable bleeding in that case will consult PT and OT for evaluation and allow ambulation with assistance.  Acute kidney injury superimposed on CKD stage II Rhabdomyolysis Acute urinary retention - Elevated creatinine 5.89.  At baseline patient's GFR above 60.  CT abdomen pelvis also showing bladder fullness and urinary tension around 1 L - Elevated CK around 9000 in the setting of fall - Acute kidney injury secondary to urinary retention/bladder outlet obstruction and rhabdomyolysis. - Plan to place the Foley catheter to relieve the obstruction. - In the ED patient has been given 2 L of LR bolus.  Continue maintenance fluid LR 125 cc/h.  Hypercalcemia -Corrected calcium  is 11.2.  Treated with 2 L of LR bolus and continue maintenance fluid LR 150 cc/h.  Essential hypertension -Pending home medication reconciliation by pharmacy.  Hypothyroidism -Continue levothyroxine  75  mcg daily.   Hyperkalemia -Elevated potassium 5.5.  Hyperkalemia in the setting of AKI and rhabdomyolysis.  Treating with oral  Lokelma 10 g. -Repeat BMP at midnight.  Transaminitis -Elevated AST/ALT 370/106.  Normal bilirubin and alkaline phosphatase level.  Significant transaminitis in the setting of rhabdomyolysis and hemosiderin induced hepatic cellular injury. -CT abdomen pelvis showed cholecystectomy.  Otherwise normal finding. -Check hepatitis panel and monitor hepatic function panel.  History of CVA -Holding aspirin  and Plavix  in the setting of subdural hemorrhage.   Chronic constipation - While in the ED patient already has 1 bowel movement.  Continue MiraLAX and Senokot twice daily.  DVT prophylaxis:  SCDs.  Deferring pharmacological prophylaxis in the setting of subdural hemorrhage. Code Status:  DNR/DNI(Do NOT Intubate).  Verified with patient's son at the bedside. Diet: Heart healthy diet Family Communication:   Family was present at bedside, at the time of interview. Opportunity was given to ask question and all questions were answered satisfactorily.  Disposition Plan: Pending repeat head CT scan, continue monitor improvement of renal function, CK level and hepatic panel. Consults: Neurosurgery Admission status:   Inpatient, progressive unit  Severity of Illness: The appropriate patient status for this patient is INPATIENT. Inpatient status is judged to be reasonable and necessary in order to provide the required intensity of service to ensure the patient's safety. The patient's presenting symptoms, physical exam findings, and initial radiographic and laboratory data in the context of their chronic comorbidities is felt to place them at high risk for further clinical deterioration. Furthermore, it is not anticipated that the patient will be medically stable for discharge from the hospital within 2 midnights of admission.   * I certify that at the point of admission it is my clinical judgment that the patient will require inpatient hospital care spanning beyond 2 midnights from the point of admission  due to high intensity of service, high risk for further deterioration and high frequency of surveillance required.Aaron Aas    Bernadine Melecio, MD [facility]  How to contact the [facility] Attending or Consulting provider 7A - 7P or covering provider during after hours 7P -7A, for this patient.  Check the care team in [facility] and look for a) attending/consulting [facility] provider listed and b) the [facility] team listed Log into [email or web address] and use [facility]'s universal password to access. If you do not have the password, please contact the hospital operator. Locate the [facility] provider you are looking for under [facility] and page to a number that you can be directly reached. If you still have difficulty reaching the provider, please page the [facility] (Director on Call) for the Hospitalists listed on amion for assistance.  08/28/2023, 2:02 AM

## 2023-08-28 ENCOUNTER — Inpatient Hospital Stay (HOSPITAL_COMMUNITY)

## 2023-08-28 DIAGNOSIS — I6200 Nontraumatic subdural hemorrhage, unspecified: Secondary | ICD-10-CM | POA: Diagnosis not present

## 2023-08-28 LAB — COMPREHENSIVE METABOLIC PANEL WITH GFR
ALT: 136 U/L — ABNORMAL HIGH (ref 0–44)
AST: 258 U/L — ABNORMAL HIGH (ref 15–41)
Albumin: 2.5 g/dL — ABNORMAL LOW (ref 3.5–5.0)
Alkaline Phosphatase: 51 U/L (ref 38–126)
Anion gap: 11 (ref 5–15)
BUN: 81 mg/dL — ABNORMAL HIGH (ref 8–23)
CO2: 17 mmol/L — ABNORMAL LOW (ref 22–32)
Calcium: 9.6 mg/dL (ref 8.9–10.3)
Chloride: 112 mmol/L — ABNORMAL HIGH (ref 98–111)
Creatinine, Ser: 5.4 mg/dL — ABNORMAL HIGH (ref 0.44–1.00)
GFR, Estimated: 7 mL/min — ABNORMAL LOW (ref >60)
Glucose, Bld: 91 mg/dL (ref 70–99)
Potassium: 4.9 mmol/L (ref 3.5–5.1)
Sodium: 140 mmol/L (ref 135–145)
Total Bilirubin: 1.1 mg/dL (ref 0.0–1.2)
Total Protein: 4.8 g/dL — ABNORMAL LOW (ref 6.5–8.1)

## 2023-08-28 LAB — CBC
HCT: 34.6 % — ABNORMAL LOW (ref 36.0–46.0)
Hemoglobin: 11.6 g/dL — ABNORMAL LOW (ref 12.0–15.0)
MCH: 30.6 pg (ref 26.0–34.0)
MCHC: 33.5 g/dL (ref 30.0–36.0)
MCV: 91.3 fL (ref 80.0–100.0)
Platelets: 135 10*3/uL — ABNORMAL LOW (ref 150–400)
RBC: 3.79 MIL/uL — ABNORMAL LOW (ref 3.87–5.11)
RDW: 14 % (ref 11.5–15.5)
WBC: 10.7 10*3/uL — ABNORMAL HIGH (ref 4.0–10.5)
nRBC: 0 % (ref 0.0–0.2)

## 2023-08-28 LAB — APTT: aPTT: 25 s (ref 24–36)

## 2023-08-28 LAB — BASIC METABOLIC PANEL WITH GFR
Anion gap: 14 (ref 5–15)
BUN: 82 mg/dL — ABNORMAL HIGH (ref 8–23)
CO2: 16 mmol/L — ABNORMAL LOW (ref 22–32)
Calcium: 9.4 mg/dL (ref 8.9–10.3)
Chloride: 109 mmol/L (ref 98–111)
Creatinine, Ser: 5.38 mg/dL — ABNORMAL HIGH (ref 0.44–1.00)
GFR, Estimated: 7 mL/min — ABNORMAL LOW (ref >60)
Glucose, Bld: 88 mg/dL (ref 70–99)
Potassium: 4.8 mmol/L (ref 3.5–5.1)
Sodium: 139 mmol/L (ref 135–145)

## 2023-08-28 LAB — PROTIME-INR
INR: 1.2 (ref 0.8–1.2)
Prothrombin Time: 15.8 s — ABNORMAL HIGH (ref 11.4–15.2)

## 2023-08-28 LAB — HEPATITIS PANEL, ACUTE
HCV Ab: NONREACTIVE
Hep A IgM: NONREACTIVE
Hep B C IgM: NONREACTIVE
Hepatitis B Surface Ag: NONREACTIVE

## 2023-08-28 LAB — CK: Total CK: 6183 U/L — ABNORMAL HIGH (ref 38–234)

## 2023-08-28 MED ORDER — HYDRALAZINE HCL 20 MG/ML IJ SOLN: 10.0000 mg | Freq: Four times a day (QID) | INTRAMUSCULAR | Status: DC | PRN

## 2023-08-28 MED ORDER — LACTATED RINGERS IV SOLN: INTRAVENOUS | Status: DC

## 2023-08-28 MED ORDER — BISACODYL 10 MG RE SUPP
10.0000 mg | Freq: Every day | RECTAL | Status: AC
  Administered 2023-08-28: 10 mg via RECTAL
  Filled 2023-08-28 (×2): qty 1

## 2023-08-28 MED ORDER — PANTOPRAZOLE SODIUM 40 MG PO TBEC
40.0000 mg | DELAYED_RELEASE_TABLET | Freq: Every day | ORAL | Status: DC
  Administered 2023-08-28 – 2023-09-05 (×7): 40 mg via ORAL
  Filled 2023-08-28 (×7): qty 1

## 2023-08-28 MED ORDER — LACTULOSE 10 GM/15ML PO SOLN
30.0000 g | Freq: Two times a day (BID) | ORAL | Status: AC
  Administered 2023-08-28 (×2): 30 g via ORAL
  Filled 2023-08-28 (×2): qty 60

## 2023-08-28 MED ORDER — SODIUM BICARBONATE 650 MG PO TABS
650.0000 mg | ORAL_TABLET | Freq: Three times a day (TID) | ORAL | Status: AC
  Administered 2023-08-28 – 2023-08-30 (×6): 650 mg via ORAL
  Filled 2023-08-28 (×6): qty 1

## 2023-08-28 MED ORDER — DOCUSATE SODIUM 100 MG PO CAPS
200.0000 mg | ORAL_CAPSULE | Freq: Two times a day (BID) | ORAL | Status: DC
  Administered 2023-08-28 – 2023-09-05 (×8): 200 mg via ORAL
  Filled 2023-08-28 (×12): qty 2

## 2023-08-28 NOTE — ED Notes (Signed)
 Patient placed on hospital bed.

## 2023-08-28 NOTE — Plan of Care (Signed)
  Problem: Education: Goal: Knowledge of General Education information will improve Description: Including pain rating scale, medication(s)/side effects and non-pharmacologic comfort measures Outcome: Progressing   Problem: Health Behavior/Discharge Planning: Goal: Ability to manage health-related needs will improve Outcome: Progressing   Problem: Clinical Measurements: Goal: Will remain free from infection Outcome: Progressing   Problem: Coping: Goal: Level of anxiety will decrease Outcome: Progressing   Problem: Elimination: Goal: Will not experience complications related to bowel motility Outcome: Progressing   Problem: Pain Managment: Goal: General experience of comfort will improve and/or be controlled Outcome: Progressing   Problem: Safety: Goal: Ability to remain free from injury will improve Outcome: Progressing

## 2023-08-28 NOTE — Progress Notes (Signed)
 Pt presented to ED after a fall. On Plavix  for h/o CVA. Nonfocal exam per EDP. Initial CTH revealing small posterior falcine SDH, stable on repeat imaging. Recommend holding Plavix  x2 weeks. No add'l neurosurgical recommendations at this time.   Dionysios Massman CAYLIN Deliyah Muckle, PA-C

## 2023-08-28 NOTE — Progress Notes (Signed)
 PROGRESS NOTE                                                                                                                                                                                                             Patient Demographics:    Tiffany Rice, is a 89 y.o. female, DOB - 1931-10-12, GNF:[id number]  Outpatient Primary MD for the patient is Tiffany Form, MD    LOS - 1  Admit date - 08/27/2023    Chief Complaint  Patient presents with   Fall   Altered Mental Status       Brief Narrative (HPI from H&P)   89 y.o. female with medical history significant of CVA on Plavix , CKD stage II, hypothyroidism, essential hypertension, hyperlipidemia and hypothyroidism presented emergency department complaining for fall and since the fall patient is unable to bear weight.  At baseline patient ambulates.  Patient is also complaining about decreased appetite since the fall and cognitive decline.  Family reported that over the course of last 1 week patient has more generalized weakness and that causing recurrent fall. During my evaluation at the bedside patient is alert oriented x 4.  Patient's son at the bedside reported that since fluid has been started patient is looking much better and hydrated.  Patient denies any headache, blurry vision, nausea, chest pain, abdominal pain, diarrhea and urinary symptoms.  Reporting ongoing constipation.     Subjective:    Tiffany Rice today has, No headache, No chest pain, No abdominal pain - No Nausea, No new weakness tingling or numbness, no SOB   Assessment  & Plan :   Unsteady gait, generalized weakness with multiple falls at assisted living.  Causing subdural hematoma.  case discussed with neurosurgery by me this morning, CT head x 2 stable, no headache or focal deficits, hold antiplatelets for 2 weeks, PT OT and monitor clinically.  Require placement.   Acute kidney injury  superimposed on CKD stage II - Rhabdomyolysis - Acute urinary retention - Elevated creatinine 5.89.  At baseline patient's GFR above 60.  CT abdomen pelvis also showing bladder fullness and urinary tension around 1 L, also had rhabdomyolysis.  Hydrate, Foley placed, Flomax added, renal ultrasound nonacute.  Continue to monitor.   Hypercalcemia - due to dehydration, will hydrate and monitor   Essential hypertension  -as needed hydralazine  Hypothyroidism  - Continue levothyroxine  75 mcg daily.     Transaminitis  - Elevated AST/ALT 370/106.  Normal bilirubin and alkaline phosphatase level.  Likely due to rhabdomyolysis monitor hold statin.  CT abdomen pelvis stable from this aspect.   History of CVA  - Holding aspirin  and Plavix  in the setting of subdural hemorrhage.    Chronic constipation  - While in the ED patient already has 1 bowel movement.  Continue MiraLAX and Senokot twice daily.  Incidental finding of left ovarian cyst and right middle lobe lung nodule.  Outpatient age-appropriate follow-up by PCP.        Condition - Extremely Guarded  Family Communication  : Discussed with son Tiffany Rice [phone]  in detail on 08/28/2023  Code Status : DNR  Consults  : Neurosurgery  PUD Prophylaxis : PPI   Procedures  :     CT chest abdomen pelvis.  1. Moderate stool ball in the rectum with perirectal fat stranding, suggestive of fecal impaction developing stercoral colitis. 2. Complex cystic lesion with thin septation in the left ovary measuring 6.6 cm. This measured 4.6 cm on 02/02/2016. Given slow interval growth over 8 years this is likely benign however low-grade cystic neoplasm is not excluded. This could be followed with ultrasound in 6-12 months if clinically appropriate given comorbidities and age. 3. 5 mm nodule in the right middle lobe along the minor fissure. No follow-up needed if patient is low-risk. 4. Moderate hiatal hernia. 5. Aortic Atherosclerosis  CT head.  Trace subdural  hemorrhage along the posterior falx measuring 4 mm in thickness (series 3/image 14). No mass effect or midline shift. No evidence of acute infarct. No hydrocephalus. No extra-axial fluid collection. Age-commensurate cerebral atrophy and chronic small vessel ischemic disease. Vascular: No hyperdense vessel. Intracranial arterial calcification. 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   CT C-spine. 1. No acute fracture in the cervical spine. 2. Frothy debris in the esophagus reaching above the thoracic inlet, which is an aspiration risk. Critical Value/emergent results were called by telephone at the time of interpretation on 08/27/2023 at 7:22 pm to provider Tiffany Rice , who verbally acknowledged these results   Renal ultrasound.  Nonacute.      Disposition Plan  :    Status is: Inpatient   DVT Prophylaxis  :    SCDs Start: 08/27/23 2129 Place TED hose Start: 08/27/23 2129    Lab Results  Component Value Date   PLT 135 (L) 08/28/2023    Diet :  Diet Order             Diet Heart Room service appropriate? Yes; Fluid consistency: Thin  Diet effective now                    Inpatient Medications  Scheduled Meds:  bisacodyl  10 mg Rectal Q0600   Chlorhexidine  Gluconate Cloth  6 each Topical Daily   docusate sodium   200 mg Oral BID   lactulose  30 g Oral BID   levothyroxine   75 mcg Oral Daily   polyethylene glycol  17 g Oral BID   senna-docusate  1 tablet Oral BID   Continuous Infusions:  lactated ringers 75 mL/hr at 08/28/23 0559   PRN Meds:.hydrALAZINE, [DISCONTINUED] ondansetron **OR** ondansetron (ZOFRAN) IV  Antibiotics  :    Anti-infectives (From admission, onward)    None         Objective:   Vitals:   08/28/23 0601 08/28/23 0981 08/28/23 0640 08/28/23 0800  BP:  124/66  112/74  Pulse:  80  78  Resp:  20  (!) 25  Temp: 98.1 F (36.7 C) (!) 97.5 F (36.4 C)  97.7 F (36.5 C)  TempSrc: Oral Oral  Oral  SpO2:  96%  97%  Weight:   64.8 kg    Height:   5\' 6"  (1.676 m)     Wt Readings from Last 3 Encounters:  08/28/23 64.8 kg  07/20/22 75.1 kg  06/08/22 76.7 kg     Intake/Output Summary (Last 24 hours) at 08/28/2023 1021 Last data filed at 08/28/2023 0640 Gross per 24 hour  Intake --  Output 1450 ml  Net -1450 ml     Physical Exam  Awake Alert, No new F.N deficits, Normal affect [place].AT,PERRAL Supple Neck, No JVD,   Symmetrical Chest wall movement, Good air movement bilaterally, CTAB RRR,No Gallops,Rubs or new Murmurs,  +ve B.Sounds, Abd Soft, No tenderness,   No Cyanosis, Clubbing or edema, Foley       Data Review:    Recent Labs  Lab 08/27/23 1830 08/28/23 0330  WBC 12.8* 10.7*  HGB 13.5 11.6*  HCT 40.3 34.6*  PLT 148* 135*  MCV 91.0 91.3  MCH 30.5 30.6  MCHC 33.5 33.5  RDW 13.8 14.0  LYMPHSABS 0.5*  --   MONOABS 1.5*  --   EOSABS 0.0  --   BASOSABS 0.0  --     Recent Labs  Lab 08/27/23 1830 08/28/23 0330  NA 141 140  139  K 5.5* 4.9  4.8  CL 110 112*  109  CO2 17* 17*  16*  ANIONGAP 14 11  14   GLUCOSE 115* 91  88  BUN 84* 81*  82*  CREATININE 5.89* 5.40*  5.38*  AST 317* 258*  ALT 160* 136*  ALKPHOS 60 51  BILITOT 1.0 1.1  ALBUMIN 3.1* 2.5*  INR  --  1.2  CALCIUM  10.5* 9.6  9.4      Recent Labs  Lab 08/27/23 1830 08/28/23 0330  INR  --  1.2  CALCIUM  10.5* 9.6  9.4    --------------------------------------------------------------------------------------------------------------- Lab Results  Component Value Date   CHOL 206 (H) 06/09/2022   HDL 48 06/09/2022   LDLCALC 141 (H) 06/09/2022   TRIG 84 06/09/2022   CHOLHDL 4.3 06/09/2022    Lab Results  Component Value Date   HGBA1C 5.6 06/08/2022   No results for input(s): "TSH", "T4TOTAL", "FREET4", "T3FREE", "THYROIDAB" in the last 72 hours. No results for input(s): "VITAMINB12", "FOLATE", "FERRITIN", "TIBC", "IRON", "RETICCTPCT" in the last 72  hours. ------------------------------------------------------------------------------------------------------------------ Cardiac Enzymes No results for input(s): "CKMB", "TROPONINI", "MYOGLOBIN" in the last 168 hours.  Invalid input(s): "CK"  Micro Results No results found for this or any previous visit (from the past 240 hours).  Radiology Report US  RENAL Result Date: 08/28/2023 CLINICAL DATA:  Acute kidney injury.  Rhabdomyolysis. EXAM: RENAL / URINARY TRACT ULTRASOUND COMPLETE COMPARISON:  CT 08/27/2023 FINDINGS: Right Kidney: Renal measurements: 10.0 x 5.1 x 4.8 cm = volume: 129.4 mL. Echogenicity within normal limits. No mass or hydronephrosis visualized. Cyst within the upper and lower pole measure up to 2.3 cm. Left Kidney: Renal measurements: 9.4 x 5.2 x 5.3 cm = volume: 134.4 mL. Echogenicity within normal limits. No mass or hydronephrosis visualized. Bladder: The urinary bladder is partially decompressed with Foley catheter. Other: None. IMPRESSION: 1. No acute findings. No hydronephrosis. 2. Right renal cysts. Electronically Signed   By: Kimberley Penman M.D.   On: 08/28/2023  05:14   CT HEAD WO CONTRAST ( ) Result Date: 08/28/2023 CLINICAL DATA:  Head trauma, minor (Age >= 65y) EXAM: CT HEAD WITHOUT CONTRAST TECHNIQUE: Contiguous axial images were obtained from the base of the skull through the vertex without intravenous contrast. RADIATION DOSE REDUCTION: This exam was performed according to the departmental dose-optimization program which includes automated exposure control, adjustment of the mA and/or kV according to patient size and/or use of iterative reconstruction technique. COMPARISON:  CT head 08/27/2023 FINDINGS: Brain: Cerebral ventricle sizes are concordant with the degree of cerebral volume loss. Patchy and confluent areas of decreased attenuation are noted throughout the deep and periventricular white matter of the cerebral hemispheres bilaterally, compatible with chronic  microvascular ischemic disease. No evidence of large-territorial acute infarction. No parenchymal hemorrhage. No mass lesion. Stable 3 mm right posterior falx cerebri subdural hematoma. No mass effect or midline shift. No hydrocephalus. Basilar cisterns are patent. Vascular: No hyperdense vessel. Atherosclerotic calcifications are present within the cavernous internal carotid and vertebral arteries. Skull: No acute fracture or focal lesion. Sinuses/Orbits: Paranasal sinuses and mastoid air cells are clear. The orbits are unremarkable. Other: None. IMPRESSION: 1. Stable 3 mm right posterior falx cerebri subdural hematoma. 2. No changes from prior CT head 08/27/2023. Electronically Signed   By: Morgane  Naveau M.D.   On: 08/28/2023 01:52   CT CHEST ABDOMEN PELVIS WO CONTRAST Result Date: 08/27/2023 CLINICAL DATA:  Blunt polytrauma, severe AKI, concern for obstructive uropathy, fall, altered mental status EXAM: CT CHEST, ABDOMEN AND PELVIS WITHOUT CONTRAST TECHNIQUE: Multidetector CT imaging of the chest, abdomen and pelvis was performed following the standard protocol without IV contrast. RADIATION DOSE REDUCTION: This exam was performed according to the departmental dose-optimization program which includes automated exposure control, adjustment of the mA and/or kV according to patient size and/or use of iterative reconstruction technique. COMPARISON:  Same day chest and hip radiographs. CT abdomen pelvis 02/02/2016 FINDINGS: CT CHEST FINDINGS Cardiovascular: Normal heart size. No pericardial effusion. Coronary artery and aortic atherosclerotic calcification. Assessment for vascular injury is limited without IV contrast. Mediastinum/Nodes: Moderate hiatal hernia. Trachea is unremarkable. No mediastinal hematoma. Lungs/Pleura: No focal consolidation, pleural effusion, or pneumothorax. 5 mm nodule in the right middle lobe along the minor fissure (series 5/image 65). Musculoskeletal: Chronic posttraumatic and  degenerative changes about the right shoulder. No acute fracture CT ABDOMEN PELVIS FINDINGS Hepatobiliary: Cholecystectomy. Prominent intra and extrahepatic biliary ducts have slightly increased from 2017 likely due to reservoir effect. The common bile duct measures 13 mm, previously 10 mm. Evaluation for solid organ injury is limited with out IV contrast. Pancreas: Unremarkable. Spleen: Unremarkable. Adrenals/Urinary Tract: Stable adrenal glands. No urinary calculi or hydronephrosis. Unremarkable bladder. Stomach/Bowel: Moderate hiatal hernia. Stomach is otherwise within normal limits. No bowel wall thickening or evidence of obstruction. Moderate stool ball in the rectum. Perirectal fat stranding. Vascular/Lymphatic: Aortic atherosclerosis. No enlarged abdominal or pelvic lymph nodes. Reproductive: Uterus and right ovary are unremarkable. Complex cystic lesion with thin septation in the left ovary measuring 6.6 cm. This measured 4.6 cm on 02/02/2016. Other: Trace free fluid in the pelvis.  No free intraperitoneal air. Musculoskeletal: No acute fracture IMPRESSION: 1. Moderate stool ball in the rectum with perirectal fat stranding, suggestive of fecal impaction developing stercoral colitis. 2. Complex cystic lesion with thin septation in the left ovary measuring 6.6 cm. This measured 4.6 cm on 02/02/2016. Given slow interval growth over 8 years this is likely benign however low-grade cystic neoplasm is not excluded. This could be followed with  ultrasound in 6-12 months if clinically appropriate given comorbidities and age. 3. 5 mm nodule in the right middle lobe along the minor fissure. No follow-up needed if patient is low-risk. 4. Moderate hiatal hernia. 5. Aortic Atherosclerosis (ICD10-I70.0). Electronically Signed   By: Rozell Cornet M.D.   On: 08/27/2023 21:10   DG Hip Unilat W or Wo Pelvis 2-3 Views Right Result Date: 08/27/2023 CLINICAL DATA:  Marvell Slider, right hip pain EXAM: DG HIP (WITH OR WITHOUT PELVIS)  2-3V RIGHT COMPARISON:  08/27/2023 FINDINGS: Frontal view of the pelvis as well as frontal and cross-table lateral views of the right hip are obtained. There are no acute displaced fractures. Alignment is anatomic. Joint spaces are well preserved. Sacroiliac joints are normal. IMPRESSION: 1. Unremarkable pelvis and right hip.  No acute fracture. Electronically Signed   By: Bobbye Burrow M.D.   On: 08/27/2023 20:12   CT HEAD WO CONTRAST ( ) Result Date: 08/27/2023 CLINICAL DATA:  Minor head trauma and neck trauma EXAM: CT HEAD WITHOUT CONTRAST CT CERVICAL SPINE WITHOUT CONTRAST TECHNIQUE: Multidetector CT imaging of the head and cervical spine was performed following the standard protocol without intravenous contrast. Multiplanar CT image reconstructions of the cervical spine were also generated. RADIATION DOSE REDUCTION: This exam was performed according to the departmental dose-optimization program which includes automated exposure control, adjustment of the mA and/or kV according to patient size and/or use of iterative reconstruction technique. COMPARISON:  MRI head 06/09/2022 and CT head 06/08/2018 FINDINGS: CT HEAD FINDINGS Brain: Trace subdural hemorrhage along the posterior falx measuring 4 mm in thickness (series 3/image 14). No mass effect or midline shift. No evidence of acute infarct. No hydrocephalus. No extra-axial fluid collection. Age-commensurate cerebral atrophy and chronic small vessel ischemic disease. Vascular: No hyperdense vessel. Intracranial arterial calcification. 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 Skull: No fracture or focal lesion. Sinuses/Orbits: No acute finding. Other: None. CT CERVICAL SPINE FINDINGS Alignment: No evidence of traumatic listhesis. Skull base and vertebrae: No acute fracture. Soft tissues and spinal canal: No prevertebral fluid or swelling. No visible canal hematoma. Disc levels: Age-related multilevel spondylosis and facet arthropathy. No severe spinal  canal narrowing. Upper chest: Frothy debris in the esophagus reaching above the thoracic inlet. Other: None. IMPRESSION: CT HEAD: 1. Trace subdural hemorrhage along the posterior falx measuring 4 mm in thickness. No mass effect or midline shift. 2. 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 TRAUMATIC BRAIN INJURY RISK STRATIFICATION Skull Fracture: No - Low/mBIG 1 Subdural Hematoma (SDH): 4mm to <37mm - mBIG 2 Subarachnoid Hemorrhage [facility]): No Epidural Hematoma (EDH): No - Low/mBIG 1 Cerebral contusion, intra-axial, intraparenchymal Hemorrhage (IPH): No Intraventricular Hemorrhage (IVH): No - Low/mBIG 1 Midline Shift > 1mm or Edema/effacement of sulci/vents: No - Low/mBIG 1 CT CERVICAL SPINE: 1. No acute fracture in the cervical spine. 2. Frothy debris in the esophagus reaching above the thoracic inlet, which is an aspiration risk. Critical Value/emergent results were called by telephone at the time of interpretation on 08/27/2023 at 7:22 pm to provider Tiffany [facility] , who verbally acknowledged these results. Electronically Signed   By: Rozell Cornet M.D.   On: 08/27/2023 19:27   CT CERVICAL SPINE WO CONTRAST Result Date: 08/27/2023 CLINICAL DATA:  Minor head trauma and neck trauma EXAM: CT HEAD WITHOUT CONTRAST CT CERVICAL SPINE WITHOUT CONTRAST TECHNIQUE: Multidetector CT imaging of the head and cervical spine was performed following the standard protocol without intravenous contrast. Multiplanar CT image reconstructions of the cervical spine were also generated. RADIATION  DOSE REDUCTION: This exam was performed according to the departmental dose-optimization program which includes automated exposure control, adjustment of the mA and/or kV according to patient size and/or use of iterative reconstruction technique. COMPARISON:  MRI head 06/09/2022 and CT head 06/08/2018 FINDINGS: CT HEAD FINDINGS Brain: Trace subdural hemorrhage along the posterior falx measuring 4 mm in thickness (series 3/image  14). No mass effect or midline shift. No evidence of acute infarct. No hydrocephalus. No extra-axial fluid collection. Age-commensurate cerebral atrophy and chronic small vessel ischemic disease. Vascular: No hyperdense vessel. Intracranial arterial calcification. 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 Skull: No fracture or focal lesion. Sinuses/Orbits: No acute finding. Other: None. CT CERVICAL SPINE FINDINGS Alignment: No evidence of traumatic listhesis. Skull base and vertebrae: No acute fracture. Soft tissues and spinal canal: No prevertebral fluid or swelling. No visible canal hematoma. Disc levels: Age-related multilevel spondylosis and facet arthropathy. No severe spinal canal narrowing. Upper chest: Frothy debris in the esophagus reaching above the thoracic inlet. Other: None. IMPRESSION: CT HEAD: 1. Trace subdural hemorrhage along the posterior falx measuring 4 mm in thickness. No mass effect or midline shift. 2. 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 TRAUMATIC BRAIN INJURY RISK STRATIFICATION Skull Fracture: No - Low/mBIG 1 Subdural Hematoma (SDH): 4mm to <62mm - mBIG 2 Subarachnoid Hemorrhage [facility]): No Epidural Hematoma (EDH): No - Low/mBIG 1 Cerebral contusion, intra-axial, intraparenchymal Hemorrhage (IPH): No Intraventricular Hemorrhage (IVH): No - Low/mBIG 1 Midline Shift > 1mm or Edema/effacement of sulci/vents: No - Low/mBIG 1 CT CERVICAL SPINE: 1. No acute fracture in the cervical spine. 2. Frothy debris in the esophagus reaching above the thoracic inlet, which is an aspiration risk. Critical Value/emergent results were called by telephone at the time of interpretation on 08/27/2023 at 7:22 pm to provider Tiffany [facility] , who verbally acknowledged these results. Electronically Signed   By: Rozell Cornet M.D.   On: 08/27/2023 19:27   DG Chest Portable 1 View Result Date: 08/27/2023 CLINICAL DATA:  Fall, concern for fracture. EXAM: PORTABLE CHEST 1 VIEW, PORTABLE PELVIS  COMPARISON:  None Available. FINDINGS: Chest: The heart size and mediastinal contours are within normal limits. There is atherosclerotic calcification of the aorta. Minimal atelectasis is noted at the left lung base. There is mild scarring at the right lung apex. No consolidation, effusion, or pneumothorax is seen. Surgical clips are noted in the right upper quadrant. No acute osseous abnormality is seen. Pelvis: No acute pelvic fracture is seen. Overlapping structures are noted at the right femoral head and the possibility of right hip fracture cannot be excluded. There is no dislocation. Mild degenerative changes are noted at the hips bilaterally and lower lumbar spine. IMPRESSION: 1. No active disease in the chest. 2. No acute pelvic fracture. 3. Overlapping structures are present at the right hip and the possibility of subcapital femoral fracture cannot be excluded. Dedicated images of the right hip are recommended. Electronically Signed   By: Wyvonnia Heimlich M.D.   On: 08/27/2023 18:56   DG Pelvis Portable Result Date: 08/27/2023 CLINICAL DATA:  Fall, concern for fracture. EXAM: PORTABLE CHEST 1 VIEW, PORTABLE PELVIS COMPARISON:  None Available. FINDINGS: Chest: The heart size and mediastinal contours are within normal limits. There is atherosclerotic calcification of the aorta. Minimal atelectasis is noted at the left lung base. There is mild scarring at the right lung apex. No consolidation, effusion, or pneumothorax is seen. Surgical clips are noted in the right upper quadrant. No acute osseous abnormality is seen. Pelvis:  No acute pelvic fracture is seen. Overlapping structures are noted at the right femoral head and the possibility of right hip fracture cannot be excluded. There is no dislocation. Mild degenerative changes are noted at the hips bilaterally and lower lumbar spine. IMPRESSION: 1. No active disease in the chest. 2. No acute pelvic fracture. 3. Overlapping structures are present at the right  hip and the possibility of subcapital femoral fracture cannot be excluded. Dedicated images of the right hip are recommended. Electronically Signed   By: Wyvonnia Heimlich M.D.   On: 08/27/2023 18:56     Signature  -   Lynnwood Sauer M.D on 08/28/2023 at 10:21 AM   -  To page go to [email or web address]

## 2023-08-28 NOTE — Evaluation (Signed)
 Clinical/Bedside Swallow Evaluation Patient Details  Name: Tiffany Rice MRN: [id number] Date of Birth: [birth year]  Today's Date: 08/28/2023 Time: SLP Start Time (ACUTE ONLY): 1455 SLP Stop Time (ACUTE ONLY): 1507 SLP Time Calculation (min) (ACUTE ONLY): 12 min  Past Medical History:  Past Medical History:  Diagnosis Date   Closed fracture of humerus 08/05/2013   pt fell while walking down the street   Complex ovarian cyst 04/05/2009   Left side.  Followed by ultrasound.   Depression    Diverticulosis    Esophageal stricture    GERD (gastroesophageal reflux disease)    Hemorrhoids    Hiatal hernia    HTN (hypertension)    Hyperlipidemia    Hypothyroidism    Obesity    Osteopenia 04/05/2008   Stroke [facility])    Past Surgical History:  Past Surgical History:  Procedure Laterality Date   CHOLECYSTECTOMY     MOHS SURGERY  1990   TUBAL LIGATION     HPI:  Pt is an 89 y.o. female who presented to the ED 5/24 with c/o fall and being unable to bear weight since. CTH: 3 mm right posterior falx cerebri subdural hematoma.PMH: CVA on Plavix , CKD stage II, hypothyroidism, essential hypertension, hyperlipidemia and hypothyroidism. Esophagram 03/14/12: Small hiatal hernia, barium pill lodged just above the hiatal hernia and did not pass, consistent with a short segment distal esophageal stricture. Prominent cricopharyngeus muscle. Mild tertiary contractions. MBS 06/10/22: no overt s/s of aspiration. Mastication was mildly prolonged due to dentition, but functional. No symptoms of esophageal dysphagia were noted or reported with trials, but pt reported then that foods "get stuck" when she eats too quickly/does not chew food well, and that this symptom is alleviated with vomiting.    Assessment / Plan / Recommendation  Clinical Impression  Pt was seen for bedside swallow evaluation with her son present for part of the evaluation. [name] reported that the pt was pocketing food ~6 months prior,  but that he hasn't observed that or received report of it recently. Oral mechanism exam was [facility] and her natural dentition was adequate for mastication. She tolerated all solids and liquids without signs or symptoms of oropharyngeal dysphagia. A regular texture diet with thin liquids is recommended at this time and further skilled SLP services are not clinically indicated for swallowing. SLP Visit Diagnosis: Dysphagia, unspecified (R13.10)    Aspiration Risk  No limitations    Diet Recommendation Regular;Thin liquid    Liquid Administration via: Cup;Straw Medication Administration: Whole meds with liquid Supervision: Staff to assist with self feeding Postural Changes: Seated upright at 90 degrees    Other  Recommendations Oral Care Recommendations: Oral care BID    Recommendations for follow up therapy are one component of a multi-disciplinary discharge planning process, led by the attending physician.  Recommendations may be updated based on patient status, additional functional criteria and insurance authorization.  Follow up Recommendations No SLP follow up      Assistance Recommended at Discharge    Functional Status Assessment Patient has not had a recent decline in their functional status  Frequency and Duration            Prognosis        Swallow Study   General Date of Onset: 03/14/12 HPI: Pt is an 89 y.o. female who presented to the ED 5/24 with c/o fall and being unable to bear weight since. CTH: 3 mm right posterior falx cerebri subdural hematoma.PMH: CVA on Plavix , CKD stage II,  hypothyroidism, essential hypertension, hyperlipidemia and hypothyroidism. Esophagram 03/14/12: Small hiatal hernia, barium pill lodged just above the hiatal hernia and did not pass, consistent with a short segment distal esophageal stricture. Prominent cricopharyngeus muscle. Mild tertiary contractions. MBS 06/10/22: no overt s/s of aspiration. Mastication was mildly prolonged due to dentition, but  functional. No symptoms of esophageal dysphagia were noted or reported with trials, but pt reported then that foods "get stuck" when she eats too quickly/does not chew food well, and that this symptom is alleviated with vomiting. Type of Study: Bedside Swallow Evaluation Previous Swallow Assessment: See HPI Diet Prior to this Study: Regular;Thin liquids (Level 0) Temperature Spikes Noted: No Respiratory Status: Room air History of Recent Intubation: No Behavior/Cognition: Alert;Pleasant mood;Cooperative Oral Cavity Assessment: Within Functional Limits Oral Care Completed by SLP: No Oral Cavity - Dentition: Adequate natural dentition;Poor condition Vision: Functional for self-feeding Self-Feeding Abilities: Needs assist Patient Positioning: Upright in bed;Postural control adequate for testing Baseline Vocal Quality: Normal Volitional Cough: Strong Volitional Swallow: Able to elicit    Oral/Motor/Sensory Function Overall Oral Motor/Sensory Function: Within functional limits   Ice Chips Ice chips: Not tested   Thin Liquid Thin Liquid: Within functional limits Presentation: Straw    Nectar Thick Nectar Thick Liquid: Not tested   Honey Thick Honey Thick Liquid: Not tested   Puree Puree: Within functional limits Presentation: Spoon   Solid     Solid: Within functional limits Presentation: Self Fed     Obryan Radu I. Valda Garnet, MS, CCC-SLP Acute Rehabilitation Services Office number [phone]  Toya Friar 08/28/2023,4:19 PM

## 2023-08-28 NOTE — Plan of Care (Signed)

## 2023-08-29 ENCOUNTER — Inpatient Hospital Stay (HOSPITAL_COMMUNITY)

## 2023-08-29 DIAGNOSIS — I6200 Nontraumatic subdural hemorrhage, unspecified: Secondary | ICD-10-CM | POA: Diagnosis not present

## 2023-08-29 LAB — BASIC METABOLIC PANEL WITH GFR
Anion gap: 10 (ref 5–15)
BUN: 82 mg/dL — ABNORMAL HIGH (ref 8–23)
CO2: 18 mmol/L — ABNORMAL LOW (ref 22–32)
Calcium: 9.1 mg/dL (ref 8.9–10.3)
Chloride: 110 mmol/L (ref 98–111)
Creatinine, Ser: 5.64 mg/dL — ABNORMAL HIGH (ref 0.44–1.00)
GFR, Estimated: 7 mL/min — ABNORMAL LOW (ref >60)
Glucose, Bld: 101 mg/dL — ABNORMAL HIGH (ref 70–99)
Potassium: 4.2 mmol/L (ref 3.5–5.1)
Sodium: 138 mmol/L (ref 135–145)

## 2023-08-29 LAB — GLUCOSE, CAPILLARY
Glucose-Capillary: 94 mg/dL (ref 70–99)
Glucose-Capillary: 138 mg/dL — ABNORMAL HIGH (ref 70–99)

## 2023-08-29 LAB — CBC WITH DIFFERENTIAL/PLATELET
Abs Immature Granulocytes: 0.04 10*3/uL (ref 0.00–0.07)
Basophils Absolute: 0 10*3/uL (ref 0.0–0.1)
Basophils Relative: 0 %
Eosinophils Absolute: 0 10*3/uL (ref 0.0–0.5)
Eosinophils Relative: 0 %
HCT: 33.3 % — ABNORMAL LOW (ref 36.0–46.0)
Hemoglobin: 11.3 g/dL — ABNORMAL LOW (ref 12.0–15.0)
Immature Granulocytes: 0 %
Lymphocytes Relative: 3 %
Lymphs Abs: 0.4 10*3/uL — ABNORMAL LOW (ref 0.7–4.0)
MCH: 30.5 pg (ref 26.0–34.0)
MCHC: 33.9 g/dL (ref 30.0–36.0)
MCV: 89.8 fL (ref 80.0–100.0)
Monocytes Absolute: 1.4 10*3/uL — ABNORMAL HIGH (ref 0.1–1.0)
Monocytes Relative: 12 %
Neutro Abs: 10.1 10*3/uL — ABNORMAL HIGH (ref 1.7–7.7)
Neutrophils Relative %: 85 %
Platelets: 130 10*3/uL — ABNORMAL LOW (ref 150–400)
RBC: 3.71 MIL/uL — ABNORMAL LOW (ref 3.87–5.11)
RDW: 13.8 % (ref 11.5–15.5)
WBC: 12 10*3/uL — ABNORMAL HIGH (ref 4.0–10.5)
nRBC: 0 % (ref 0.0–0.2)

## 2023-08-29 LAB — MAGNESIUM: Magnesium: 1.8 mg/dL (ref 1.7–2.4)

## 2023-08-29 LAB — CK: Total CK: 4199 U/L — ABNORMAL HIGH (ref 38–234)

## 2023-08-29 LAB — PHOSPHORUS: Phosphorus: 3.9 mg/dL (ref 2.5–4.6)

## 2023-08-29 MED ORDER — LACTATED RINGERS IV SOLN: INTRAVENOUS | Status: DC

## 2023-08-29 NOTE — Progress Notes (Signed)
 PROGRESS NOTE                                                                                                                                                                                                             Patient Demographics:    Tiffany Rice, is a 89 y.o. female, DOB - 30-Nov-1931, ZOX:[id number]  Outpatient Primary MD for the patient is Tita Form, MD    LOS - 2  Admit date - 08/27/2023    Chief Complaint  Patient presents with   Fall   Altered Mental Status       Brief Narrative (HPI from H&P)   89 y.o. female with medical history significant of CVA on Plavix , CKD stage II, hypothyroidism, essential hypertension, hyperlipidemia and hypothyroidism presented emergency department complaining for fall and since the fall patient is unable to bear weight.  At baseline patient ambulates.  Patient is also complaining about decreased appetite since the fall and cognitive decline.  Family reported that over the course of last 1 week patient has more generalized weakness and that causing recurrent fall. During my evaluation at the bedside patient is alert oriented x 4.  Patient's son at the bedside reported that since fluid has been started patient is looking much better and hydrated.  Patient denies any headache, blurry vision, nausea, chest pain, abdominal pain, diarrhea and urinary symptoms.  Reporting ongoing constipation.     Subjective:   Patient in bed, appears comfortable, denies any headache, no fever, no chest pain or pressure, no shortness of breath , no abdominal pain. No new focal weakness.   Assessment  & Plan :   Unsteady gait, generalized weakness with multiple falls at assisted living.  Causing subdural hematoma.  case discussed with neurosurgery by me this morning, CT head x 2 stable, no headache or focal deficits, hold antiplatelets for 2 weeks, PT OT and monitor clinically.  Require placement.    Acute kidney injury superimposed on CKD stage II - Rhabdomyolysis - Acute urinary retention -  Elevated creatinine 5.89.  At baseline patient's GFR above 60.  CT abdomen pelvis also showing bladder fullness and urinary tension around 1 L, also had rhabdomyolysis.  Hydrate, Foley placed, Flomax added, renal ultrasound nonacute.  Continue to monitor IV fluids, will get nephrology input as well.  Poor candidate for HD.   Hypercalcemia -  due to dehydration, will hydrate and monitor   Essential hypertension  - as needed hydralazine   Hypothyroidism  - Continue levothyroxine  75 mcg daily.     Transaminitis  - Elevated AST/ALT 370/106.  Normal bilirubin and alkaline phosphatase level.  Likely due to rhabdomyolysis monitor hold statin.  CT abdomen pelvis stable from this aspect.   History of CVA  - Holding aspirin  and Plavix  in the setting of subdural hemorrhage.    Chronic constipation  - While in the ED patient already has 1 bowel movement.  Continue MiraLAX and Senokot twice daily.  Working with PT patient reported some left ankle pain.  Will check x-rays.  Incidental finding of left ovarian cyst and right middle lobe lung nodule.  Outpatient age-appropriate follow-up by PCP.        Condition - Extremely Guarded  Family Communication  :   Discussed with son [name] [phone]  in detail on 08/28/2023, message left on 08/29/2023 at 9:43 AM, called [phone] on 08/29/2023 at 9:44 AM, no response.  Called and updated son [name] [phone]  on 08/29/2023    Code Status : DNR  Consults  : Neurosurgery  PUD Prophylaxis : PPI   Procedures  :     CT chest abdomen pelvis.  1. Moderate stool ball in the rectum with perirectal fat stranding, suggestive of fecal impaction developing stercoral colitis. 2. Complex cystic lesion with thin septation in the left ovary measuring 6.6 cm. This measured 4.6 cm on 02/02/2016. Given slow interval growth over 8 years this is likely benign however low-grade  cystic neoplasm is not excluded. This could be followed with ultrasound in 6-12 months if clinically appropriate given comorbidities and age. 3. 5 mm nodule in the right middle lobe along the minor fissure. No follow-up needed if patient is low-risk. 4. Moderate hiatal hernia. 5. Aortic Atherosclerosis  CT head.  Trace subdural hemorrhage along the posterior falx measuring 4 mm in thickness (series 3/image 14). No mass effect or midline shift. No evidence of acute infarct. No hydrocephalus. No extra-axial fluid collection. Age-commensurate cerebral atrophy and chronic small vessel ischemic disease. Vascular: No hyperdense vessel. Intracranial arterial calcification. 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   CT C-spine. 1. No acute fracture in the cervical spine. 2. Frothy debris in the esophagus reaching above the thoracic inlet, which is an aspiration risk. Critical Value/emergent results were called by telephone at the time of interpretation on 08/27/2023 at 7:22 pm to provider MADISON [facility] , who verbally acknowledged these results   Renal ultrasound.  Nonacute.      Disposition Plan  :    Status is: Inpatient   DVT Prophylaxis  :    SCDs Start: 08/27/23 2129 Place TED hose Start: 08/27/23 2129    Lab Results  Component Value Date   PLT 130 (L) 08/29/2023    Diet :  Diet Order             Diet Heart Room service appropriate? Yes; Fluid consistency: Thin  Diet effective now                    Inpatient Medications  Scheduled Meds:  bisacodyl  10 mg Rectal Q0600   Chlorhexidine  Gluconate Cloth  6 each Topical Daily   docusate sodium   200 mg Oral BID   levothyroxine   75 mcg Oral Daily   pantoprazole  40 mg Oral Daily   polyethylene glycol  17 g Oral BID   senna-docusate  1  tablet Oral BID   sodium bicarbonate  650 mg Oral TID   Continuous Infusions:  lactated ringers     PRN Meds:.hydrALAZINE, [DISCONTINUED] ondansetron **OR** ondansetron (ZOFRAN)  IV  Antibiotics  :    Anti-infectives (From admission, onward)    None         Objective:   Vitals:   08/28/23 1945 08/29/23 0004 08/29/23 0338 08/29/23 0752  BP: 115/71 (!) 153/65 (!) 147/59 122/64  Pulse: 82 75 81 77  Resp: 20 19 20  (!) 21  Temp: 97.7 F (36.5 C) 98.2 F (36.8 C) 97.9 F (36.6 C) 98.2 F (36.8 C)  TempSrc: Oral Oral Oral Oral  SpO2: 97% 100% 98% 100%  Weight:      Height:        Wt Readings from Last 3 Encounters:  08/28/23 64.8 kg  07/20/22 75.1 kg  06/08/22 76.7 kg     Intake/Output Summary (Last 24 hours) at 08/29/2023 0942 Last data filed at 08/29/2023 1610 Gross per 24 hour  Intake 200 ml  Output 350 ml  Net -150 ml     Physical Exam  Awake Alert, No new F.N deficits, Normal affect [place].AT,PERRAL Supple Neck, No JVD,   Symmetrical Chest wall movement, Good air movement bilaterally, CTAB RRR,No Gallops,Rubs or new Murmurs,  +ve B.Sounds, Abd Soft, No tenderness,   No Cyanosis, Clubbing or edema, Foley       Data Review:    Recent Labs  Lab 08/27/23 1830 08/28/23 0330 08/29/23 0425  WBC 12.8* 10.7* 12.0*  HGB 13.5 11.6* 11.3*  HCT 40.3 34.6* 33.3*  PLT 148* 135* 130*  MCV 91.0 91.3 89.8  MCH 30.5 30.6 30.5  MCHC 33.5 33.5 33.9  RDW 13.8 14.0 13.8  LYMPHSABS 0.5*  --  0.4*  MONOABS 1.5*  --  1.4*  EOSABS 0.0  --  0.0  BASOSABS 0.0  --  0.0    Recent Labs  Lab 08/27/23 1830 08/28/23 0330 08/29/23 0425  NA 141 140  139 138  K 5.5* 4.9  4.8 4.2  CL 110 112*  109 110  CO2 17* 17*  16* 18*  ANIONGAP 14 11  14 10   GLUCOSE 115* 91  88 101*  BUN 84* 81*  82* 82*  CREATININE 5.89* 5.40*  5.38* 5.64*  AST 317* 258*  --   ALT 160* 136*  --   ALKPHOS 60 51  --   BILITOT 1.0 1.1  --   ALBUMIN 3.1* 2.5*  --   INR  --  1.2  --   MG  --   --  1.8  PHOS  --   --  3.9  CALCIUM  10.5* 9.6  9.4 9.1      Recent Labs  Lab 08/27/23 1830 08/28/23 0330 08/29/23 0425  INR  --  1.2  --   MG  --   --  1.8   CALCIUM  10.5* 9.6  9.4 9.1    --------------------------------------------------------------------------------------------------------------- Lab Results  Component Value Date   CHOL 206 (H) 06/09/2022   HDL 48 06/09/2022   LDLCALC 141 (H) 06/09/2022   TRIG 84 06/09/2022   CHOLHDL 4.3 06/09/2022    Lab Results  Component Value Date   HGBA1C 5.6 06/08/2022   No results for input(s): "TSH", "T4TOTAL", "FREET4", "T3FREE", "THYROIDAB" in the last 72 hours. No results for input(s): "VITAMINB12", "FOLATE", "FERRITIN", "TIBC", "IRON", "RETICCTPCT" in the last 72 hours. ------------------------------------------------------------------------------------------------------------------ Cardiac Enzymes No results for input(s): "CKMB", "TROPONINI", "MYOGLOBIN" in the  last 168 hours.  Invalid input(s): "CK"  Micro Results No results found for this or any previous visit (from the past 240 hours).  Radiology Report US  RENAL Result Date: 08/28/2023 CLINICAL DATA:  Acute kidney injury.  Rhabdomyolysis. EXAM: RENAL / URINARY TRACT ULTRASOUND COMPLETE COMPARISON:  CT 08/27/2023 FINDINGS: Right Kidney: Renal measurements: 10.0 x 5.1 x 4.8 cm = volume: 129.4 mL. Echogenicity within normal limits. No mass or hydronephrosis visualized. Cyst within the upper and lower pole measure up to 2.3 cm. Left Kidney: Renal measurements: 9.4 x 5.2 x 5.3 cm = volume: 134.4 mL. Echogenicity within normal limits. No mass or hydronephrosis visualized. Bladder: The urinary bladder is partially decompressed with Foley catheter. Other: None. IMPRESSION: 1. No acute findings. No hydronephrosis. 2. Right renal cysts. Electronically Signed   By: Kimberley Penman M.D.   On: 08/28/2023 05:14   CT HEAD WO CONTRAST ( ) Result Date: 08/28/2023 CLINICAL DATA:  Head trauma, minor (Age >= 65y) EXAM: CT HEAD WITHOUT CONTRAST TECHNIQUE: Contiguous axial images were obtained from the base of the skull through the vertex without  intravenous contrast. RADIATION DOSE REDUCTION: This exam was performed according to the departmental dose-optimization program which includes automated exposure control, adjustment of the mA and/or kV according to patient size and/or use of iterative reconstruction technique. COMPARISON:  CT head 08/27/2023 FINDINGS: Brain: Cerebral ventricle sizes are concordant with the degree of cerebral volume loss. Patchy and confluent areas of decreased attenuation are noted throughout the deep and periventricular white matter of the cerebral hemispheres bilaterally, compatible with chronic microvascular ischemic disease. No evidence of large-territorial acute infarction. No parenchymal hemorrhage. No mass lesion. Stable 3 mm right posterior falx cerebri subdural hematoma. No mass effect or midline shift. No hydrocephalus. Basilar cisterns are patent. Vascular: No hyperdense vessel. Atherosclerotic calcifications are present within the cavernous internal carotid and vertebral arteries. Skull: No acute fracture or focal lesion. Sinuses/Orbits: Paranasal sinuses and mastoid air cells are clear. The orbits are unremarkable. Other: None. IMPRESSION: 1. Stable 3 mm right posterior falx cerebri subdural hematoma. 2. No changes from prior CT head 08/27/2023. Electronically Signed   By: Morgane  Naveau M.D.   On: 08/28/2023 01:52   CT CHEST ABDOMEN PELVIS WO CONTRAST Result Date: 08/27/2023 CLINICAL DATA:  Blunt polytrauma, severe AKI, concern for obstructive uropathy, fall, altered mental status EXAM: CT CHEST, ABDOMEN AND PELVIS WITHOUT CONTRAST TECHNIQUE: Multidetector CT imaging of the chest, abdomen and pelvis was performed following the standard protocol without IV contrast. RADIATION DOSE REDUCTION: This exam was performed according to the departmental dose-optimization program which includes automated exposure control, adjustment of the mA and/or kV according to patient size and/or use of iterative reconstruction  technique. COMPARISON:  Same day chest and hip radiographs. CT abdomen pelvis 02/02/2016 FINDINGS: CT CHEST FINDINGS Cardiovascular: Normal heart size. No pericardial effusion. Coronary artery and aortic atherosclerotic calcification. Assessment for vascular injury is limited without IV contrast. Mediastinum/Nodes: Moderate hiatal hernia. Trachea is unremarkable. No mediastinal hematoma. Lungs/Pleura: No focal consolidation, pleural effusion, or pneumothorax. 5 mm nodule in the right middle lobe along the minor fissure (series 5/image 65). Musculoskeletal: Chronic posttraumatic and degenerative changes about the right shoulder. No acute fracture CT ABDOMEN PELVIS FINDINGS Hepatobiliary: Cholecystectomy. Prominent intra and extrahepatic biliary ducts have slightly increased from 2017 likely due to reservoir effect. The common bile duct measures 13 mm, previously 10 mm. Evaluation for solid organ injury is limited with out IV contrast. Pancreas: Unremarkable. Spleen: Unremarkable. Adrenals/Urinary Tract: Stable adrenal glands. No urinary  calculi or hydronephrosis. Unremarkable bladder. Stomach/Bowel: Moderate hiatal hernia. Stomach is otherwise within normal limits. No bowel wall thickening or evidence of obstruction. Moderate stool ball in the rectum. Perirectal fat stranding. Vascular/Lymphatic: Aortic atherosclerosis. No enlarged abdominal or pelvic lymph nodes. Reproductive: Uterus and right ovary are unremarkable. Complex cystic lesion with thin septation in the left ovary measuring 6.6 cm. This measured 4.6 cm on 02/02/2016. Other: Trace free fluid in the pelvis.  No free intraperitoneal air. Musculoskeletal: No acute fracture IMPRESSION: 1. Moderate stool ball in the rectum with perirectal fat stranding, suggestive of fecal impaction developing stercoral colitis. 2. Complex cystic lesion with thin septation in the left ovary measuring 6.6 cm. This measured 4.6 cm on 02/02/2016. Given slow interval growth over 8  years this is likely benign however low-grade cystic neoplasm is not excluded. This could be followed with ultrasound in 6-12 months if clinically appropriate given comorbidities and age. 3. 5 mm nodule in the right middle lobe along the minor fissure. No follow-up needed if patient is low-risk. 4. Moderate hiatal hernia. 5. Aortic Atherosclerosis (ICD10-I70.0). Electronically Signed   By: Rozell Cornet M.D.   On: 08/27/2023 21:10   DG Hip Unilat W or Wo Pelvis 2-3 Views Right Result Date: 08/27/2023 CLINICAL DATA:  Marvell Slider, right hip pain EXAM: DG HIP (WITH OR WITHOUT PELVIS) 2-3V RIGHT COMPARISON:  08/27/2023 FINDINGS: Frontal view of the pelvis as well as frontal and cross-table lateral views of the right hip are obtained. There are no acute displaced fractures. Alignment is anatomic. Joint spaces are well preserved. Sacroiliac joints are normal. IMPRESSION: 1. Unremarkable pelvis and right hip.  No acute fracture. Electronically Signed   By: Bobbye Burrow M.D.   On: 08/27/2023 20:12   CT HEAD WO CONTRAST ( ) Result Date: 08/27/2023 CLINICAL DATA:  Minor head trauma and neck trauma EXAM: CT HEAD WITHOUT CONTRAST CT CERVICAL SPINE WITHOUT CONTRAST TECHNIQUE: Multidetector CT imaging of the head and cervical spine was performed following the standard protocol without intravenous contrast. Multiplanar CT image reconstructions of the cervical spine were also generated. RADIATION DOSE REDUCTION: This exam was performed according to the departmental dose-optimization program which includes automated exposure control, adjustment of the mA and/or kV according to patient size and/or use of iterative reconstruction technique. COMPARISON:  MRI head 06/09/2022 and CT head 06/08/2018 FINDINGS: CT HEAD FINDINGS Brain: Trace subdural hemorrhage along the posterior falx measuring 4 mm in thickness (series 3/image 14). No mass effect or midline shift. No evidence of acute infarct. No hydrocephalus. No extra-axial fluid  collection. Age-commensurate cerebral atrophy and chronic small vessel ischemic disease. Vascular: No hyperdense vessel. Intracranial arterial calcification. 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 Skull: No fracture or focal lesion. Sinuses/Orbits: No acute finding. Other: None. CT CERVICAL SPINE FINDINGS Alignment: No evidence of traumatic listhesis. Skull base and vertebrae: No acute fracture. Soft tissues and spinal canal: No prevertebral fluid or swelling. No visible canal hematoma. Disc levels: Age-related multilevel spondylosis and facet arthropathy. No severe spinal canal narrowing. Upper chest: Frothy debris in the esophagus reaching above the thoracic inlet. Other: None. IMPRESSION: CT HEAD: 1. Trace subdural hemorrhage along the posterior falx measuring 4 mm in thickness. No mass effect or midline shift. 2. 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 TRAUMATIC BRAIN INJURY RISK STRATIFICATION Skull Fracture: No - Low/mBIG 1 Subdural Hematoma (SDH): 4mm to <37mm - mBIG 2 Subarachnoid Hemorrhage [facility]): No Epidural Hematoma (EDH): No - Low/mBIG 1 Cerebral contusion, intra-axial, intraparenchymal Hemorrhage (IPH): No Intraventricular  Hemorrhage (IVH): No - Low/mBIG 1 Midline Shift > 1mm or Edema/effacement of sulci/vents: No - Low/mBIG 1 CT CERVICAL SPINE: 1. No acute fracture in the cervical spine. 2. Frothy debris in the esophagus reaching above the thoracic inlet, which is an aspiration risk. Critical Value/emergent results were called by telephone at the time of interpretation on 08/27/2023 at 7:22 pm to provider MADISON [facility] , who verbally acknowledged these results. Electronically Signed   By: Rozell Cornet M.D.   On: 08/27/2023 19:27   CT CERVICAL SPINE WO CONTRAST Result Date: 08/27/2023 CLINICAL DATA:  Minor head trauma and neck trauma EXAM: CT HEAD WITHOUT CONTRAST CT CERVICAL SPINE WITHOUT CONTRAST TECHNIQUE: Multidetector CT imaging of the head and cervical spine was performed  following the standard protocol without intravenous contrast. Multiplanar CT image reconstructions of the cervical spine were also generated. RADIATION DOSE REDUCTION: This exam was performed according to the departmental dose-optimization program which includes automated exposure control, adjustment of the mA and/or kV according to patient size and/or use of iterative reconstruction technique. COMPARISON:  MRI head 06/09/2022 and CT head 06/08/2018 FINDINGS: CT HEAD FINDINGS Brain: Trace subdural hemorrhage along the posterior falx measuring 4 mm in thickness (series 3/image 14). No mass effect or midline shift. No evidence of acute infarct. No hydrocephalus. No extra-axial fluid collection. Age-commensurate cerebral atrophy and chronic small vessel ischemic disease. Vascular: No hyperdense vessel. Intracranial arterial calcification. 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 Skull: No fracture or focal lesion. Sinuses/Orbits: No acute finding. Other: None. CT CERVICAL SPINE FINDINGS Alignment: No evidence of traumatic listhesis. Skull base and vertebrae: No acute fracture. Soft tissues and spinal canal: No prevertebral fluid or swelling. No visible canal hematoma. Disc levels: Age-related multilevel spondylosis and facet arthropathy. No severe spinal canal narrowing. Upper chest: Frothy debris in the esophagus reaching above the thoracic inlet. Other: None. IMPRESSION: CT HEAD: 1. Trace subdural hemorrhage along the posterior falx measuring 4 mm in thickness. No mass effect or midline shift. 2. 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 TRAUMATIC BRAIN INJURY RISK STRATIFICATION Skull Fracture: No - Low/mBIG 1 Subdural Hematoma (SDH): 4mm to <57mm - mBIG 2 Subarachnoid Hemorrhage [facility]): No Epidural Hematoma (EDH): No - Low/mBIG 1 Cerebral contusion, intra-axial, intraparenchymal Hemorrhage (IPH): No Intraventricular Hemorrhage (IVH): No - Low/mBIG 1 Midline Shift > 1mm or Edema/effacement of  sulci/vents: No - Low/mBIG 1 CT CERVICAL SPINE: 1. No acute fracture in the cervical spine. 2. Frothy debris in the esophagus reaching above the thoracic inlet, which is an aspiration risk. Critical Value/emergent results were called by telephone at the time of interpretation on 08/27/2023 at 7:22 pm to provider MADISON [facility] , who verbally acknowledged these results. Electronically Signed   By: Rozell Cornet M.D.   On: 08/27/2023 19:27   DG Chest Portable 1 View Result Date: 08/27/2023 CLINICAL DATA:  Fall, concern for fracture. EXAM: PORTABLE CHEST 1 VIEW, PORTABLE PELVIS COMPARISON:  None Available. FINDINGS: Chest: The heart size and mediastinal contours are within normal limits. There is atherosclerotic calcification of the aorta. Minimal atelectasis is noted at the left lung base. There is mild scarring at the right lung apex. No consolidation, effusion, or pneumothorax is seen. Surgical clips are noted in the right upper quadrant. No acute osseous abnormality is seen. Pelvis: No acute pelvic fracture is seen. Overlapping structures are noted at the right femoral head and the possibility of right hip fracture cannot be excluded. There is no dislocation. Mild degenerative changes are noted at the  hips bilaterally and lower lumbar spine. IMPRESSION: 1. No active disease in the chest. 2. No acute pelvic fracture. 3. Overlapping structures are present at the right hip and the possibility of subcapital femoral fracture cannot be excluded. Dedicated images of the right hip are recommended. Electronically Signed   By: Wyvonnia Heimlich M.D.   On: 08/27/2023 18:56   DG Pelvis Portable Result Date: 08/27/2023 CLINICAL DATA:  Fall, concern for fracture. EXAM: PORTABLE CHEST 1 VIEW, PORTABLE PELVIS COMPARISON:  None Available. FINDINGS: Chest: The heart size and mediastinal contours are within normal limits. There is atherosclerotic calcification of the aorta. Minimal atelectasis is noted at the left lung base. There  is mild scarring at the right lung apex. No consolidation, effusion, or pneumothorax is seen. Surgical clips are noted in the right upper quadrant. No acute osseous abnormality is seen. Pelvis: No acute pelvic fracture is seen. Overlapping structures are noted at the right femoral head and the possibility of right hip fracture cannot be excluded. There is no dislocation. Mild degenerative changes are noted at the hips bilaterally and lower lumbar spine. IMPRESSION: 1. No active disease in the chest. 2. No acute pelvic fracture. 3. Overlapping structures are present at the right hip and the possibility of subcapital femoral fracture cannot be excluded. Dedicated images of the right hip are recommended. Electronically Signed   By: Wyvonnia Heimlich M.D.   On: 08/27/2023 18:56     Signature  -   Lynnwood Sauer M.D on 08/29/2023 at 9:42 AM   -  To page go to [email or web address]

## 2023-08-29 NOTE — Progress Notes (Addendum)
 PT Cancellation Note  Patient Details Name: Tiffany Rice MRN: [id number] DOB: Apr 01, 1932   Cancelled Treatment:    Reason Eval/Treat Not Completed: Just returned from xray. Complains of Lt ankle pain. Eating breakfast, family arriving. Will check back early afternoon for comprehensive evaluation.  430 Second attempt - patient wishing to rest. Noted ASO delivered, will educate on use tomorrow. Ice applied to Lt ankle.   Jory Ng, PT, DPT [facility]  Rehabilitation Services Physical Therapist Office: [phone] Website: [email or web address]    Tiffany Rice 08/29/2023, 11:07 AM

## 2023-08-29 NOTE — Consult Note (Signed)
 [facility]  HISTORY AND PHYSICAL  Tiffany Rice is an 89 y.o. female.    Chief Complaint: fall and AMS  HPI: Pt is an 17F with a PMH sig for HTN, HLD, h/o stroke, and hypothyroidism who is now seen in consultation at the request of Dr Zelda Hickman for eval and recs re:  severe AKI.    Pt presented to [facility] 08/27/23 after a mechanical fall at [facility].  She was not able to walk after the fall- normally can ambulate with a walker.  Had a small subdural hemorrhage, CK 9000, and CT showing large stool burden.  Thought to have possible urinary retention, Foley placed.  She is on gentle hydration.    Cr on admission 5.89, is now 5.64 today.  Making some urine.  In this setting we are asked to see.    Son [name] at the bedside.  Notes at least a 6 month history of low mobility, decreased eating and drinking, and a general FTT picture.  QOL has diminished significantly.    PMH: Past Medical History:  Diagnosis Date   Closed fracture of humerus 08/05/2013   pt fell while walking down the street   Complex ovarian cyst 04/05/2009   Left side.  Followed by ultrasound.   Depression    Diverticulosis    Esophageal stricture    GERD (gastroesophageal reflux disease)    Hemorrhoids    Hiatal hernia    HTN (hypertension)    Hyperlipidemia    Hypothyroidism    Obesity    Osteopenia 04/05/2008   Stroke (HCC)    PSH: Past Surgical History:  Procedure Laterality Date   CHOLECYSTECTOMY     MOHS SURGERY  1990   TUBAL LIGATION      Past Medical History:  Diagnosis Date   Closed fracture of humerus 08/05/2013   pt fell while walking down the street   Complex ovarian cyst 04/05/2009   Left side.  Followed by ultrasound.   Depression    Diverticulosis    Esophageal stricture    GERD (gastroesophageal reflux disease)    Hemorrhoids    Hiatal hernia    HTN (hypertension)    Hyperlipidemia    Hypothyroidism    Obesity    Osteopenia 04/05/2008   Stroke (HCC)      Medications:  Scheduled:  bisacodyl  10 mg Rectal Q0600   Chlorhexidine  Gluconate Cloth  6 each Topical Daily   docusate sodium   200 mg Oral BID   levothyroxine   75 mcg Oral Daily   pantoprazole  40 mg Oral Daily   polyethylene glycol  17 g Oral BID   senna-docusate  1 tablet Oral BID   sodium bicarbonate  650 mg Oral TID    Medications Prior to Admission  Medication Sig Dispense Refill   aspirin  EC 81 MG tablet Take 1 tablet (81 mg total) by mouth daily. Swallow whole. 30 tablet 12   carvedilol  (COREG ) 6.25 MG tablet Take 1 tablet (6.25 mg total) by mouth 2 (two) times daily with a meal. 60 tablet 3   Cholecalciferol (VITAMIN D-3) 25 MCG (1000 UT) CAPS Take 1,000 Units by mouth every evening.     levothyroxine  (SYNTHROID ) 75 MCG tablet Take 75 mcg by mouth daily.     olmesartan (BENICAR) 40 MG tablet Take 40 mg by mouth daily.  3   potassium chloride (K-DUR) 10 MEQ tablet Take 10 mEq by mouth every evening.     rosuvastatin  (CRESTOR ) 20 MG tablet  Take 1 tablet (20 mg total) by mouth daily. (Patient taking differently: Take 40 mg by mouth daily.) 30 tablet 3    ALLERGIES:   Allergies  Allergen Reactions   Bactrim [Sulfamethoxazole-Trimethoprim] Other (See Comments)    Mood swings  **Not listed on the Physician's Orders"*     FAM HX: Family History  Problem Relation Age of Onset   Clotting disorder Father    Depression Father    Breast cancer Mother    Colon cancer Son    Colon polyps Daughter    Hyperlipidemia Son        x2    Hypertension Son        x 2   Hyperlipidemia Daughter     Social History:   reports that she has quit smoking. Her smoking use included cigarettes. She has never used smokeless tobacco. She reports that she does not drink alcohol and does not use drugs.  ROS: ROS: all other systems reviewed and are negative except as per HPI  Blood pressure (!) 119/56, pulse 77, temperature 98.9 F (37.2 C), temperature source Oral, resp. rate (!)  22, height 5\' 6"  (1.676 m), weight 64.8 kg, SpO2 100%. PHYSICAL EXAM: Physical Exam GEN NAD, sleeping HEENT EOMI PERRL NECK no JVD PULM clear CV RRR ABD soft EXT no LE edema NEURO sleeping, arousable  SKIN  poor-ish turgor  Results for orders placed or performed during the hospital encounter of 08/27/23 (from the past 48 hours)  Urinalysis, Routine w reflex microscopic -Urine, Clean Catch     Status: Abnormal   Collection Time: 08/27/23  6:13 PM  Result Value Ref Range   Color, Urine YELLOW YELLOW   APPearance CLEAR CLEAR   Specific Gravity, Urine 1.012 1.005 - 1.030   pH 5.0 5.0 - 8.0   Glucose, UA NEGATIVE NEGATIVE mg/dL   Hgb urine dipstick LARGE (A) NEGATIVE   Bilirubin Urine NEGATIVE NEGATIVE   Ketones, ur 5 (A) NEGATIVE mg/dL   Protein, ur 30 (A) NEGATIVE mg/dL   Nitrite NEGATIVE NEGATIVE   Leukocytes,Ua NEGATIVE NEGATIVE   RBC / HPF 0-5 0 - 5 RBC/hpf   WBC, UA 0-5 0 - 5 WBC/hpf   Bacteria, UA MANY (A) NONE SEEN   Squamous Epithelial / HPF 0-5 0 - 5 /HPF    Comment: Performed at [facility], [address], [place], [place] [address]  Comprehensive metabolic panel     Status: Abnormal   Collection Time: 08/27/23  6:30 PM  Result Value Ref Range   Sodium 141 135 - 145 mmol/L   Potassium 5.5 (H) 3.5 - 5.1 mmol/L   Chloride 110 98 - 111 mmol/L   CO2 17 (L) 22 - 32 mmol/L   Glucose, Bld 115 (H) 70 - 99 mg/dL    Comment: Glucose reference range applies only to samples taken after fasting for at least 8 hours.   BUN 84 (H) 8 - 23 mg/dL   Creatinine, Ser 7.25 (H) 0.44 - 1.00 mg/dL   Calcium  10.5 (H) 8.9 - 10.3 mg/dL   Total Protein 6.1 (L) 6.5 - 8.1 g/dL   Albumin 3.1 (L) 3.5 - 5.0 g/dL   AST 366 (H) 15 - 41 U/L   ALT 160 (H) 0 - 44 U/L   Alkaline Phosphatase 60 38 - 126 U/L   Total Bilirubin 1.0 0.0 - 1.2 mg/dL   GFR, Estimated 6 (L) >60 mL/min    Comment: (NOTE) Calculated using the CKD-EPI Creatinine Equation (2021)  Anion gap 14 5 - 15    Comment:  Performed at [facility], [address] [place], [place] [address]  CBC with Differential     Status: Abnormal   Collection Time: 08/27/23  6:30 PM  Result Value Ref Range   WBC 12.8 (H) 4.0 - 10.5 K/uL   RBC 4.43 3.87 - 5.11 MIL/uL   Hemoglobin 13.5 12.0 - 15.0 g/dL   HCT 60.4 54.0 - 98.1 %   MCV 91.0 80.0 - 100.0 fL   MCH 30.5 26.0 - 34.0 pg   MCHC 33.5 30.0 - 36.0 g/dL   RDW 19.1 47.8 - 29.5 %   Platelets 148 (L) 150 - 400 K/uL   nRBC 0.0 0.0 - 0.2 %   Neutrophils Relative % 84 %   Neutro Abs 10.8 (H) 1.7 - 7.7 K/uL   Lymphocytes Relative 4 %   Lymphs Abs 0.5 (L) 0.7 - 4.0 K/uL   Monocytes Relative 11 %   Monocytes Absolute 1.5 (H) 0.1 - 1.0 K/uL   Eosinophils Relative 0 %   Eosinophils Absolute 0.0 0.0 - 0.5 K/uL   Basophils Relative 0 %   Basophils Absolute 0.0 0.0 - 0.1 K/uL   Immature Granulocytes 1 %   Abs Immature Granulocytes 0.07 0.00 - 0.07 K/uL    Comment: Performed at [facility], [address] [place], [place] [address]  CK     Status: Abnormal   Collection Time: 08/27/23  6:30 PM  Result Value Ref Range   Total CK 8,825 (H) 38 - 234 U/L    Comment: RESULT CONFIRMED BY MANUAL DILUTION Performed at [facility], [address], [place], [place] [address]   Blood gas, venous (at [facility])     Status: Abnormal   Collection Time: 08/27/23 11:25 PM  Result Value Ref Range   pH, Ven 7.28 7.25 - 7.43   pCO2, Ven 36 (L) 44 - 60 mmHg   pO2, Ven 31 (LL) 32 - 45 mmHg    Comment: CRITICAL RESULT CALLED TO, READ BACK BY AND VERIFIED WITH: PLUNKETT, S. RN @2342  08/27/23 SATRAINR    Bicarbonate 16.9 (L) 20.0 - 28.0 mmol/L   Acid-base deficit 9.0 (H) 0.0 - 2.0 mmol/L   O2 Saturation 58.2 %   Patient temperature 37.0     Comment: Performed at [facility], [address], [place], [place] [address]  CK     Status: Abnormal   Collection Time: 08/28/23  3:30 AM  Result Value Ref Range   Total CK 6,183 (H) 38 - 234 U/L    Comment: RESULT  CONFIRMED BY MANUAL DILUTION Performed at [facility], [address] [place], [place] [address]   CBC     Status: Abnormal   Collection Time: 08/28/23  3:30 AM  Result Value Ref Range   WBC 10.7 (H) 4.0 - 10.5 K/uL   RBC 3.79 (L) 3.87 - 5.11 MIL/uL   Hemoglobin 11.6 (L) 12.0 - 15.0 g/dL   HCT 13.2 (L) 44.0 - 10.2 %   MCV 91.3 80.0 - 100.0 fL   MCH 30.6 26.0 - 34.0 pg   MCHC 33.5 30.0 - 36.0 g/dL   RDW 72.5 36.6 - 44.0 %   Platelets 135 (L) 150 - 400 K/uL   nRBC 0.0 0.0 - 0.2 %    Comment: Performed at [facility], [address] [place], [place] [address]  Comprehensive metabolic panel  Status: Abnormal   Collection Time: 08/28/23  3:30 AM  Result Value Ref Range   Sodium 140 135 - 145 mmol/L   Potassium 4.9 3.5 - 5.1 mmol/L   Chloride 112 (H) 98 - 111 mmol/L   CO2 17 (L) 22 - 32 mmol/L   Glucose, Bld 91 70 - 99 mg/dL    Comment: Glucose reference range applies only to samples taken after fasting for at least 8 hours.   BUN 81 (H) 8 - 23 mg/dL   Creatinine, Ser 1.61 (H) 0.44 - 1.00 mg/dL   Calcium  9.6 8.9 - 10.3 mg/dL   Total Protein 4.8 (L) 6.5 - 8.1 g/dL   Albumin 2.5 (L) 3.5 - 5.0 g/dL   AST 096 (H) 15 - 41 U/L   ALT 136 (H) 0 - 44 U/L   Alkaline Phosphatase 51 38 - 126 U/L   Total Bilirubin 1.1 0.0 - 1.2 mg/dL   GFR, Estimated 7 (L) >60 mL/min    Comment: (NOTE) Calculated using the CKD-EPI Creatinine Equation (2021)    Anion gap 11 5 - 15    Comment: Performed at [facility], [address], [place], [place] [address]  Hepatitis panel, acute     Status: None   Collection Time: 08/28/23  3:30 AM  Result Value Ref Range   Hepatitis B Surface Ag NON REACTIVE NON REACTIVE   HCV Ab NON REACTIVE NON REACTIVE    Comment: (NOTE) Nonreactive HCV antibody screen is consistent with no HCV infections,  unless recent infection is suspected or other evidence exists to indicate HCV infection.     Hep A IgM NON REACTIVE NON REACTIVE   Hep B C IgM  NON REACTIVE NON REACTIVE    Comment: Performed at [facility], [address] [place], [place] [address]  Protime-INR     Status: Abnormal   Collection Time: 08/28/23  3:30 AM  Result Value Ref Range   Prothrombin Time 15.8 (H) 11.4 - 15.2 seconds   INR 1.2 0.8 - 1.2    Comment: (NOTE) INR goal varies based on device and disease states. Performed at [facility], [address] [place], [place] [address]   APTT     Status: None   Collection Time: 08/28/23  3:30 AM  Result Value Ref Range   aPTT 25 24 - 36 seconds    Comment: Performed at [facility], [address], [place], [place] [address]  Basic metabolic panel with GFR     Status: Abnormal   Collection Time: 08/28/23  3:30 AM  Result Value Ref Range   Sodium 139 135 - 145 mmol/L   Potassium 4.8 3.5 - 5.1 mmol/L   Chloride 109 98 - 111 mmol/L   CO2 16 (L) 22 - 32 mmol/L   Glucose, Bld 88 70 - 99 mg/dL    Comment: Glucose reference range applies only to samples taken after fasting for at least 8 hours.   BUN 82 (H) 8 - 23 mg/dL   Creatinine, Ser 3.08 (H) 0.44 - 1.00 mg/dL   Calcium  9.4 8.9 - 10.3 mg/dL   GFR, Estimated 7 (L) >60 mL/min    Comment: (NOTE) Calculated using the CKD-EPI Creatinine Equation (2021)    Anion gap 14 5 - 15    Comment: Performed at [facility], [address], [place], [place] [address]  Magnesium     Status: None   Collection Time: 08/29/23  4:25  AM  Result Value Ref Range   Magnesium 1.8 1.7 - 2.4 mg/dL    Comment: Performed at [facility], [address] [place], [place] [address]  CBC with Differential/Platelet     Status: Abnormal   Collection Time: 08/29/23  4:25 AM  Result Value Ref Range   WBC 12.0 (H) 4.0 - 10.5 K/uL   RBC 3.71 (L) 3.87 - 5.11 MIL/uL   Hemoglobin 11.3 (L) 12.0 - 15.0 g/dL   HCT 13.2 (L) 44.0 - 10.2 %   MCV 89.8 80.0 - 100.0 fL   MCH 30.5 26.0 - 34.0 pg   MCHC 33.9 30.0 - 36.0 g/dL   RDW 72.5 36.6 - 44.0 %   Platelets 130 (L)  150 - 400 K/uL   nRBC 0.0 0.0 - 0.2 %   Neutrophils Relative % 85 %   Neutro Abs 10.1 (H) 1.7 - 7.7 K/uL   Lymphocytes Relative 3 %   Lymphs Abs 0.4 (L) 0.7 - 4.0 K/uL   Monocytes Relative 12 %   Monocytes Absolute 1.4 (H) 0.1 - 1.0 K/uL   Eosinophils Relative 0 %   Eosinophils Absolute 0.0 0.0 - 0.5 K/uL   Basophils Relative 0 %   Basophils Absolute 0.0 0.0 - 0.1 K/uL   Immature Granulocytes 0 %   Abs Immature Granulocytes 0.04 0.00 - 0.07 K/uL    Comment: Performed at [facility], [address] [place], [place] [address]  Basic metabolic panel with GFR     Status: Abnormal   Collection Time: 08/29/23  4:25 AM  Result Value Ref Range   Sodium 138 135 - 145 mmol/L   Potassium 4.2 3.5 - 5.1 mmol/L   Chloride 110 98 - 111 mmol/L   CO2 18 (L) 22 - 32 mmol/L   Glucose, Bld 101 (H) 70 - 99 mg/dL    Comment: Glucose reference range applies only to samples taken after fasting for at least 8 hours.   BUN 82 (H) 8 - 23 mg/dL   Creatinine, Ser 5.95 (H) 0.44 - 1.00 mg/dL   Calcium  9.1 8.9 - 10.3 mg/dL   GFR, Estimated 7 (L) >60 mL/min    Comment: (NOTE) Calculated using the CKD-EPI Creatinine Equation (2021)    Anion gap 10 5 - 15    Comment: Performed at [facility], [address] [place], [place] [address]  Phosphorus     Status: None   Collection Time: 08/29/23  4:25 AM  Result Value Ref Range   Phosphorus 3.9 2.5 - 4.6 mg/dL    Comment: Performed at [facility], [address], [place], [place] [address]  CK     Status: Abnormal   Collection Time: 08/29/23  4:25 AM  Result Value Ref Range   Total CK 4,199 (H) 38 - 234 U/L    Comment: RESULT CONFIRMED BY MANUAL DILUTION Performed at [facility], [address] [place], [place] [address]   Glucose, capillary     Status: None   Collection Time: 08/29/23  7:55 AM  Result Value Ref Range   Glucose-Capillary 94 70 - 99 mg/dL    Comment: Glucose reference range applies only to samples taken after  fasting for at least 8 hours.  Glucose, capillary     Status: Abnormal   Collection Time: 08/29/23 12:17 PM  Result Value Ref Range   Glucose-Capillary 138 (H) 70 - 99 mg/dL    Comment: Glucose reference range applies  only to samples taken after fasting for at least 8 hours.    DG Ankle Complete Left Result Date: 08/29/2023 CLINICAL DATA:  [id number] Left ankle pain [id number] EXAM: LEFT ANKLE COMPLETE - 3 VIEW COMPARISON:  None Available. FINDINGS: There is no evidence of fracture, dislocation, or subluxation. There is no evidence of arthropathy or other focal bone abnormality. Osseous structures are osteopenic. Soft tissue swelling noted at the ankle mortise. IMPRESSION: Ankle mortise Soft tissue swelling. No acute osseous abnormalities. Electronically Signed   By: Sydell Eva M.D.   On: 08/29/2023 11:09   US  RENAL Result Date: 08/28/2023 CLINICAL DATA:  Acute kidney injury.  Rhabdomyolysis. EXAM: RENAL / URINARY TRACT ULTRASOUND COMPLETE COMPARISON:  CT 08/27/2023 FINDINGS: Right Kidney: Renal measurements: 10.0 x 5.1 x 4.8 cm = volume: 129.4 mL. Echogenicity within normal limits. No mass or hydronephrosis visualized. Cyst within the upper and lower pole measure up to 2.3 cm. Left Kidney: Renal measurements: 9.4 x 5.2 x 5.3 cm = volume: 134.4 mL. Echogenicity within normal limits. No mass or hydronephrosis visualized. Bladder: The urinary bladder is partially decompressed with Foley catheter. Other: None. IMPRESSION: 1. No acute findings. No hydronephrosis. 2. Right renal cysts. Electronically Signed   By: Kimberley Penman M.D.   On: 08/28/2023 05:14   CT HEAD WO CONTRAST ( ) Result Date: 08/28/2023 CLINICAL DATA:  Head trauma, minor (Age >= 65y) EXAM: CT HEAD WITHOUT CONTRAST TECHNIQUE: Contiguous axial images were obtained from the base of the skull through the vertex without intravenous contrast. RADIATION DOSE REDUCTION: This exam was performed according to the departmental dose-optimization  program which includes automated exposure control, adjustment of the mA and/or kV according to patient size and/or use of iterative reconstruction technique. COMPARISON:  CT head 08/27/2023 FINDINGS: Brain: Cerebral ventricle sizes are concordant with the degree of cerebral volume loss. Patchy and confluent areas of decreased attenuation are noted throughout the deep and periventricular white matter of the cerebral hemispheres bilaterally, compatible with chronic microvascular ischemic disease. No evidence of large-territorial acute infarction. No parenchymal hemorrhage. No mass lesion. Stable 3 mm right posterior falx cerebri subdural hematoma. No mass effect or midline shift. No hydrocephalus. Basilar cisterns are patent. Vascular: No hyperdense vessel. Atherosclerotic calcifications are present within the cavernous internal carotid and vertebral arteries. Skull: No acute fracture or focal lesion. Sinuses/Orbits: Paranasal sinuses and mastoid air cells are clear. The orbits are unremarkable. Other: None. IMPRESSION: 1. Stable 3 mm right posterior falx cerebri subdural hematoma. 2. No changes from prior CT head 08/27/2023. Electronically Signed   By: Morgane  Naveau M.D.   On: 08/28/2023 01:52   CT CHEST ABDOMEN PELVIS WO CONTRAST Result Date: 08/27/2023 CLINICAL DATA:  Blunt polytrauma, severe AKI, concern for obstructive uropathy, fall, altered mental status EXAM: CT CHEST, ABDOMEN AND PELVIS WITHOUT CONTRAST TECHNIQUE: Multidetector CT imaging of the chest, abdomen and pelvis was performed following the standard protocol without IV contrast. RADIATION DOSE REDUCTION: This exam was performed according to the departmental dose-optimization program which includes automated exposure control, adjustment of the mA and/or kV according to patient size and/or use of iterative reconstruction technique. COMPARISON:  Same day chest and hip radiographs. CT abdomen pelvis 02/02/2016 FINDINGS: CT CHEST FINDINGS  Cardiovascular: Normal heart size. No pericardial effusion. Coronary artery and aortic atherosclerotic calcification. Assessment for vascular injury is limited without IV contrast. Mediastinum/Nodes: Moderate hiatal hernia. Trachea is unremarkable. No mediastinal hematoma. Lungs/Pleura: No focal consolidation, pleural effusion, or pneumothorax. 5 mm nodule in the right middle lobe along the  minor fissure (series 5/image 65). Musculoskeletal: Chronic posttraumatic and degenerative changes about the right shoulder. No acute fracture CT ABDOMEN PELVIS FINDINGS Hepatobiliary: Cholecystectomy. Prominent intra and extrahepatic biliary ducts have slightly increased from 2017 likely due to reservoir effect. The common bile duct measures 13 mm, previously 10 mm. Evaluation for solid organ injury is limited with out IV contrast. Pancreas: Unremarkable. Spleen: Unremarkable. Adrenals/Urinary Tract: Stable adrenal glands. No urinary calculi or hydronephrosis. Unremarkable bladder. Stomach/Bowel: Moderate hiatal hernia. Stomach is otherwise within normal limits. No bowel wall thickening or evidence of obstruction. Moderate stool ball in the rectum. Perirectal fat stranding. Vascular/Lymphatic: Aortic atherosclerosis. No enlarged abdominal or pelvic lymph nodes. Reproductive: Uterus and right ovary are unremarkable. Complex cystic lesion with thin septation in the left ovary measuring 6.6 cm. This measured 4.6 cm on 02/02/2016. Other: Trace free fluid in the pelvis.  No free intraperitoneal air. Musculoskeletal: No acute fracture IMPRESSION: 1. Moderate stool ball in the rectum with perirectal fat stranding, suggestive of fecal impaction developing stercoral colitis. 2. Complex cystic lesion with thin septation in the left ovary measuring 6.6 cm. This measured 4.6 cm on 02/02/2016. Given slow interval growth over 8 years this is likely benign however low-grade cystic neoplasm is not excluded. This could be followed with  ultrasound in 6-12 months if clinically appropriate given comorbidities and age. 3. 5 mm nodule in the right middle lobe along the minor fissure. No follow-up needed if patient is low-risk. 4. Moderate hiatal hernia. 5. Aortic Atherosclerosis (ICD10-I70.0). Electronically Signed   By: Rozell Cornet M.D.   On: 08/27/2023 21:10   DG Hip Unilat W or Wo Pelvis 2-3 Views Right Result Date: 08/27/2023 CLINICAL DATA:  Marvell Slider, right hip pain EXAM: DG HIP (WITH OR WITHOUT PELVIS) 2-3V RIGHT COMPARISON:  08/27/2023 FINDINGS: Frontal view of the pelvis as well as frontal and cross-table lateral views of the right hip are obtained. There are no acute displaced fractures. Alignment is anatomic. Joint spaces are well preserved. Sacroiliac joints are normal. IMPRESSION: 1. Unremarkable pelvis and right hip.  No acute fracture. Electronically Signed   By: Bobbye Burrow M.D.   On: 08/27/2023 20:12   CT HEAD WO CONTRAST ( ) Result Date: 08/27/2023 CLINICAL DATA:  Minor head trauma and neck trauma EXAM: CT HEAD WITHOUT CONTRAST CT CERVICAL SPINE WITHOUT CONTRAST TECHNIQUE: Multidetector CT imaging of the head and cervical spine was performed following the standard protocol without intravenous contrast. Multiplanar CT image reconstructions of the cervical spine were also generated. RADIATION DOSE REDUCTION: This exam was performed according to the departmental dose-optimization program which includes automated exposure control, adjustment of the mA and/or kV according to patient size and/or use of iterative reconstruction technique. COMPARISON:  MRI head 06/09/2022 and CT head 06/08/2018 FINDINGS: CT HEAD FINDINGS Brain: Trace subdural hemorrhage along the posterior falx measuring 4 mm in thickness (series 3/image 14). No mass effect or midline shift. No evidence of acute infarct. No hydrocephalus. No extra-axial fluid collection. Age-commensurate cerebral atrophy and chronic small vessel ischemic disease. Vascular: No  hyperdense vessel. Intracranial arterial calcification. 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 Skull: No fracture or focal lesion. Sinuses/Orbits: No acute finding. Other: None. CT CERVICAL SPINE FINDINGS Alignment: No evidence of traumatic listhesis. Skull base and vertebrae: No acute fracture. Soft tissues and spinal canal: No prevertebral fluid or swelling. No visible canal hematoma. Disc levels: Age-related multilevel spondylosis and facet arthropathy. No severe spinal canal narrowing. Upper chest: Frothy debris in the esophagus reaching above the thoracic inlet.  Other: None. IMPRESSION: CT HEAD: 1. Trace subdural hemorrhage along the posterior falx measuring 4 mm in thickness. No mass effect or midline shift. 2. 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 TRAUMATIC BRAIN INJURY RISK STRATIFICATION Skull Fracture: No - Low/mBIG 1 Subdural Hematoma (SDH): 4mm to <71mm - mBIG 2 Subarachnoid Hemorrhage [facility]): No Epidural Hematoma (EDH): No - Low/mBIG 1 Cerebral contusion, intra-axial, intraparenchymal Hemorrhage (IPH): No Intraventricular Hemorrhage (IVH): No - Low/mBIG 1 Midline Shift > 1mm or Edema/effacement of sulci/vents: No - Low/mBIG 1 CT CERVICAL SPINE: 1. No acute fracture in the cervical spine. 2. Frothy debris in the esophagus reaching above the thoracic inlet, which is an aspiration risk. Critical Value/emergent results were called by telephone at the time of interpretation on 08/27/2023 at 7:22 pm to provider [facility] , who verbally acknowledged these results. Electronically Signed   By: Rozell Cornet M.D.   On: 08/27/2023 19:27   CT CERVICAL SPINE WO CONTRAST Result Date: 08/27/2023 CLINICAL DATA:  Minor head trauma and neck trauma EXAM: CT HEAD WITHOUT CONTRAST CT CERVICAL SPINE WITHOUT CONTRAST TECHNIQUE: Multidetector CT imaging of the head and cervical spine was performed following the standard protocol without intravenous contrast. Multiplanar CT image reconstructions of  the cervical spine were also generated. RADIATION DOSE REDUCTION: This exam was performed according to the departmental dose-optimization program which includes automated exposure control, adjustment of the mA and/or kV according to patient size and/or use of iterative reconstruction technique. COMPARISON:  MRI head 06/09/2022 and CT head 06/08/2018 FINDINGS: CT HEAD FINDINGS Brain: Trace subdural hemorrhage along the posterior falx measuring 4 mm in thickness (series 3/image 14). No mass effect or midline shift. No evidence of acute infarct. No hydrocephalus. No extra-axial fluid collection. Age-commensurate cerebral atrophy and chronic small vessel ischemic disease. Vascular: No hyperdense vessel. Intracranial arterial calcification. 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 Skull: No fracture or focal lesion. Sinuses/Orbits: No acute finding. Other: None. CT CERVICAL SPINE FINDINGS Alignment: No evidence of traumatic listhesis. Skull base and vertebrae: No acute fracture. Soft tissues and spinal canal: No prevertebral fluid or swelling. No visible canal hematoma. Disc levels: Age-related multilevel spondylosis and facet arthropathy. No severe spinal canal narrowing. Upper chest: Frothy debris in the esophagus reaching above the thoracic inlet. Other: None. IMPRESSION: CT HEAD: 1. Trace subdural hemorrhage along the posterior falx measuring 4 mm in thickness. No mass effect or midline shift. 2. 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 TRAUMATIC BRAIN INJURY RISK STRATIFICATION Skull Fracture: No - Low/mBIG 1 Subdural Hematoma (SDH): 4mm to <90mm - mBIG 2 Subarachnoid Hemorrhage [facility]): No Epidural Hematoma (EDH): No - Low/mBIG 1 Cerebral contusion, intra-axial, intraparenchymal Hemorrhage (IPH): No Intraventricular Hemorrhage (IVH): No - Low/mBIG 1 Midline Shift > 1mm or Edema/effacement of sulci/vents: No - Low/mBIG 1 CT CERVICAL SPINE: 1. No acute fracture in the cervical spine. 2. Frothy debris in  the esophagus reaching above the thoracic inlet, which is an aspiration risk. Critical Value/emergent results were called by telephone at the time of interpretation on 08/27/2023 at 7:22 pm to provider [facility] , who verbally acknowledged these results. Electronically Signed   By: Rozell Cornet M.D.   On: 08/27/2023 19:27   DG Chest Portable 1 View Result Date: 08/27/2023 CLINICAL DATA:  Fall, concern for fracture. EXAM: PORTABLE CHEST 1 VIEW, PORTABLE PELVIS COMPARISON:  None Available. FINDINGS: Chest: The heart size and mediastinal contours are within normal limits. There is atherosclerotic calcification of the aorta. Minimal atelectasis is noted at  the left lung base. There is mild scarring at the right lung apex. No consolidation, effusion, or pneumothorax is seen. Surgical clips are noted in the right upper quadrant. No acute osseous abnormality is seen. Pelvis: No acute pelvic fracture is seen. Overlapping structures are noted at the right femoral head and the possibility of right hip fracture cannot be excluded. There is no dislocation. Mild degenerative changes are noted at the hips bilaterally and lower lumbar spine. IMPRESSION: 1. No active disease in the chest. 2. No acute pelvic fracture. 3. Overlapping structures are present at the right hip and the possibility of subcapital femoral fracture cannot be excluded. Dedicated images of the right hip are recommended. Electronically Signed   By: Wyvonnia Heimlich M.D.   On: 08/27/2023 18:56   DG Pelvis Portable Result Date: 08/27/2023 CLINICAL DATA:  Fall, concern for fracture. EXAM: PORTABLE CHEST 1 VIEW, PORTABLE PELVIS COMPARISON:  None Available. FINDINGS: Chest: The heart size and mediastinal contours are within normal limits. There is atherosclerotic calcification of the aorta. Minimal atelectasis is noted at the left lung base. There is mild scarring at the right lung apex. No consolidation, effusion, or pneumothorax is seen. Surgical clips  are noted in the right upper quadrant. No acute osseous abnormality is seen. Pelvis: No acute pelvic fracture is seen. Overlapping structures are noted at the right femoral head and the possibility of right hip fracture cannot be excluded. There is no dislocation. Mild degenerative changes are noted at the hips bilaterally and lower lumbar spine. IMPRESSION: 1. No active disease in the chest. 2. No acute pelvic fracture. 3. Overlapping structures are present at the right hip and the possibility of subcapital femoral fracture cannot be excluded. Dedicated images of the right hip are recommended. Electronically Signed   By: Wyvonnia Heimlich M.D.   On: 08/27/2023 18:56    Assessment/Plan  AKI: severe AKI in the setting of mild rhabdo, constipation, dehydration, urinary retention.    - agree with gentle hydration  - likely multifactorial insults which have created a dense AKI  - making some urine  - she is a poor dialysis candidate- son [name] agrees with this  - if no sig improvement with gentle IVFs by tomorrow, will c/s palliative care for hospice services  2.  SDH:  - nonop per NSG  3.  Rhabdo:  - IVFs  4.  FTT: longstanding issue-- mobility and decreased appetite is the root of this issue  5.  Dispo: pending  Leandra Pro 08/29/2023, 2:32 PM

## 2023-08-29 NOTE — Evaluation (Signed)
 Occupational Therapy Evaluation Patient Details Name: Tiffany Rice MRN: [id number] DOB: 1932/03/25 Today's Date: 08/29/2023   History of Present Illness   Pt is an 89 y/o female presenting after a fall and impaired mobility at ALF. CT head showed small posterior SDH. Also with AKI, hypercalcemia, rhabdomyolysis and acute urinary retention. PMH: CVA on Plavix , CKD2, hypothyroidism, HTN, HLD.     Clinical Impressions PTA, pt lives at [facility], typically ambulatory with a walker and reports Modified Independence with ADLs (unsure of accuracy). Pt presents now with deficits in cognition, endurance, strength and sitting balance. Pt requiring up to Max A for bed mobility and sitting balance briefly before reporting need to return to supine citing fatigue. Pt also reporting L ankle pain this AM. Pt requires Mod A - Total A for ADLs at this time. [name] present, plans to discuss if ALF staff can provide the assist pt currently needs. If not, as pt is functioning below baseline, would recommend continued inpatient follow up therapy, <3 hours/day at DC.     If plan is discharge home, recommend the following:   A lot of help with walking and/or transfers;Two people to help with walking and/or transfers;A lot of help with bathing/dressing/bathroom;Two people to help with bathing/dressing/bathroom     Functional Status Assessment   Patient has had a recent decline in their functional status and demonstrates the ability to make significant improvements in function in a reasonable and predictable amount of time.     Equipment Recommendations   Other (comment) (TBD)     Recommendations for Other Services         Precautions/Restrictions   Precautions Precautions: Fall Restrictions Weight Bearing Restrictions Per Provider Order: No     Mobility Bed Mobility Overal bed mobility: Needs Assistance Bed Mobility: Supine to Sit, Rolling, Sit to Supine Rolling: Mod  assist   Supine to sit: Max assist Sit to supine: Mod assist   General bed mobility comments: Assist for BLE to EOB, use of bed pad to scoot hips and significant assist to lift trunk. Pt with poor balance, sat briefly before reporting need to lay down with max cues needed to prevent pt from laying horizontally in bed. with tactile cues, pt able to lay trunk sidelying with assist for BLE back into bed. Mod A to roll for nursing to assess if BM noted    Transfers                   General transfer comment: unable to progress due to poor sitting balance      Balance Overall balance assessment: Needs assistance Sitting-balance support: Feet supported, Bilateral upper extremity supported Sitting balance-Leahy Scale: Poor                                     ADL either performed or assessed with clinical judgement   ADL Overall ADL's : Needs assistance/impaired Eating/Feeding: Set up   Grooming: Set up;Bed level   Upper Body Bathing: Moderate assistance;Bed level   Lower Body Bathing: Maximal assistance;Bed level   Upper Body Dressing : Moderate assistance;Bed level   Lower Body Dressing: Total assistance;Bed level       Toileting- Clothing Manipulation and Hygiene: Total assistance;Bed level               Vision Ability to See in Adequate Light: 0 Adequate Patient Visual Report: No change from  baseline Vision Assessment?: No apparent visual deficits     Perception         Praxis         Pertinent Vitals/Pain Pain Assessment Pain Assessment: Faces Faces Pain Scale: Hurts little more Pain Location: L ankle Pain Descriptors / Indicators: Sore Pain Intervention(s): Monitored during session, Limited activity within patient's tolerance     Extremity/Trunk Assessment Upper Extremity Assessment Upper Extremity Assessment: Generalized weakness;Right hand dominant   Lower Extremity Assessment Lower Extremity Assessment: Defer to PT  evaluation   Cervical / Trunk Assessment Cervical / Trunk Assessment: Normal   Communication Communication Communication: Impaired Factors Affecting Communication: Hearing impaired   Cognition Arousal: Alert Behavior During Therapy: WFL for tasks assessed/performed, Flat affect Cognition: Cognition impaired   Orientation impairments: Time, Situation Awareness: Intellectual awareness impaired, Online awareness impaired Memory impairment (select all impairments): Short-term memory, Declarative long-term memory Attention impairment (select first level of impairment): Sustained attention, Selective attention Executive functioning impairment (select all impairments): Initiation, Organization, Sequencing, Problem solving, Reasoning OT - Cognition Comments: no cognitive impairments noted in PMH though pt does endorse memory issues, unable to recall falls at ALF, asked what time it was and then asked if morning or night. Cues for initiation, problem solving and safety.                 Following commands: Impaired Following commands impaired: Follows one step commands with increased time, Follows multi-step commands inconsistently     Cueing  General Comments   Cueing Techniques: Verbal cues;Gestural cues;Tactile cues;Visual cues  Son entering at end of session, hopes to further discuss with ALF they physical assistance that they can provide given pt's current deficits.   Exercises     Shoulder Instructions      Home Living Family/patient expects to be discharged to:: Assisted living                                 Additional Comments: from [facility], has a walker- unsure if RW vs Rollator      Prior Functioning/Environment Prior Level of Function : Needs assist;Patient poor historian/Family not available             Mobility Comments: uses walker, walks to dining hall for meals ADLs Comments: pt reports independence with ADLs, standing for showers  though unsure of accuracy.    OT Problem List: Decreased strength;Impaired balance (sitting and/or standing);Decreased activity tolerance;Decreased cognition;Decreased safety awareness;Decreased knowledge of use of DME or AE;Pain   OT Treatment/Interventions: Self-care/ADL training;Therapeutic exercise;Energy conservation;DME and/or AE instruction;Therapeutic activities;Patient/family education;Balance training      OT Goals(Current goals can be found in the care plan section)   Acute Rehab OT Goals Patient Stated Goal: son hopeful for kidney lab values to improve, improve mobility and back to ALF as able OT Goal Formulation: With patient/family Time For Goal Achievement: 09/12/23 Potential to Achieve Goals: Good ADL Goals Pt Will Perform Grooming: with contact guard assist;standing Pt Will Transfer to Toilet: with mod assist;stand pivot transfer;bedside commode Additional ADL Goal #1: Pt to complete bed mobility with Min A in prep for EOB ADLs   OT Frequency:  Min 2X/week    Co-evaluation              AM-PAC OT "6 Clicks" Daily Activity     Outcome Measure Help from another person eating meals?: A Little Help from another person taking care of personal grooming?: A  Little Help from another person toileting, which includes using toliet, bedpan, or urinal?: Total Help from another person bathing (including washing, rinsing, drying)?: A Lot Help from another person to put on and taking off regular upper body clothing?: A Lot Help from another person to put on and taking off regular lower body clothing?: Total 6 Click Score: 12   End of Session Nurse Communication: Mobility status  Activity Tolerance: Patient limited by fatigue Patient left: in bed;with bed alarm set;with call bell/phone within reach;with nursing/sitter in room;with family/visitor present  OT Visit Diagnosis: Unsteadiness on feet (R26.81);Other abnormalities of gait and mobility (R26.89);Muscle weakness  (generalized) (M62.81);Other symptoms and signs involving cognitive function                Time: 1610-9604 OT Time Calculation (min): 21 min Charges:  OT General Charges $OT Visit: 1 Visit OT Evaluation $OT Eval Moderate Complexity: 1 Mod  Lawrence Pretty, OTR/L Acute Rehab Services Office: [phone]   Tiffany Rice 08/29/2023, 9:22 AM

## 2023-08-29 NOTE — Progress Notes (Signed)
 Orthopedic Tech Progress Note Patient Details:  Tiffany Rice 24-Apr-1931 [id number]  Ortho Devices Type of Ortho Device: ASO Ortho Device/Splint Location: LLE Ortho Device/Splint Interventions: Ordered, Application, Removal, Adjustment   Post Interventions Patient Tolerated: Fair Instructions Provided: Care of device  [name] 08/29/2023, 1:44 PM

## 2023-08-30 DIAGNOSIS — N179 Acute kidney failure, unspecified: Secondary | ICD-10-CM | POA: Diagnosis not present

## 2023-08-30 DIAGNOSIS — Z66 Do not resuscitate: Secondary | ICD-10-CM

## 2023-08-30 DIAGNOSIS — I6200 Nontraumatic subdural hemorrhage, unspecified: Secondary | ICD-10-CM | POA: Diagnosis not present

## 2023-08-30 DIAGNOSIS — Z515 Encounter for palliative care: Secondary | ICD-10-CM

## 2023-08-30 DIAGNOSIS — N189 Chronic kidney disease, unspecified: Secondary | ICD-10-CM

## 2023-08-30 LAB — BASIC METABOLIC PANEL WITH GFR
Anion gap: 9 (ref 5–15)
BUN: 84 mg/dL — ABNORMAL HIGH (ref 8–23)
CO2: 19 mmol/L — ABNORMAL LOW (ref 22–32)
Calcium: 8.9 mg/dL (ref 8.9–10.3)
Chloride: 108 mmol/L (ref 98–111)
Creatinine, Ser: 5.73 mg/dL — ABNORMAL HIGH (ref 0.44–1.00)
GFR, Estimated: 7 mL/min — ABNORMAL LOW (ref >60)
Glucose, Bld: 87 mg/dL (ref 70–99)
Potassium: 4.1 mmol/L (ref 3.5–5.1)
Sodium: 136 mmol/L (ref 135–145)

## 2023-08-30 LAB — CBC WITH DIFFERENTIAL/PLATELET
Abs Immature Granulocytes: 0.06 10*3/uL (ref 0.00–0.07)
Basophils Absolute: 0 10*3/uL (ref 0.0–0.1)
Basophils Relative: 0 %
Eosinophils Absolute: 0.1 10*3/uL (ref 0.0–0.5)
Eosinophils Relative: 1 %
HCT: 31 % — ABNORMAL LOW (ref 36.0–46.0)
Hemoglobin: 10.5 g/dL — ABNORMAL LOW (ref 12.0–15.0)
Immature Granulocytes: 1 %
Lymphocytes Relative: 7 %
Lymphs Abs: 0.7 10*3/uL (ref 0.7–4.0)
MCH: 30.1 pg (ref 26.0–34.0)
MCHC: 33.9 g/dL (ref 30.0–36.0)
MCV: 88.8 fL (ref 80.0–100.0)
Monocytes Absolute: 1.3 10*3/uL — ABNORMAL HIGH (ref 0.1–1.0)
Monocytes Relative: 13 %
Neutro Abs: 8.3 10*3/uL — ABNORMAL HIGH (ref 1.7–7.7)
Neutrophils Relative %: 78 %
Platelets: 120 10*3/uL — ABNORMAL LOW (ref 150–400)
RBC: 3.49 MIL/uL — ABNORMAL LOW (ref 3.87–5.11)
RDW: 13.9 % (ref 11.5–15.5)
WBC: 10.5 10*3/uL (ref 4.0–10.5)
nRBC: 0 % (ref 0.0–0.2)

## 2023-08-30 LAB — MAGNESIUM: Magnesium: 1.7 mg/dL (ref 1.7–2.4)

## 2023-08-30 LAB — PHOSPHORUS: Phosphorus: 2.8 mg/dL (ref 2.5–4.6)

## 2023-08-30 LAB — CK: Total CK: 2807 U/L — ABNORMAL HIGH (ref 38–234)

## 2023-08-30 MED ORDER — ACETAMINOPHEN 325 MG PO TABS: 650.0000 mg | ORAL_TABLET | Freq: Four times a day (QID) | ORAL | Status: DC | PRN

## 2023-08-30 NOTE — Progress Notes (Signed)
 Orthopedic Tech Progress Note Patient Details:  Tiffany Rice 1931/09/29 [id number]  Ortho Devices Type of Ortho Device: CAM walker Ortho Device/Splint Location: LLE Ortho Device/Splint Interventions: Ordered, Other (comment)PT is getting ready to work with patient, family at bedside   Post Interventions Patient Tolerated: Other (comment) Instructions Provided: Care of device  [name] 08/30/2023, 10:44 AM

## 2023-08-30 NOTE — Plan of Care (Signed)
  Problem: Education: Goal: Knowledge of General Education information will improve Description: Including pain rating scale, medication(s)/side effects and non-pharmacologic comfort measures Outcome: Progressing   Problem: Clinical Measurements: Goal: Will remain free from infection Outcome: Progressing Goal: Diagnostic test results will improve Outcome: Progressing   Problem: Activity: Goal: Risk for activity intolerance will decrease Outcome: Progressing   Problem: Coping: Goal: Level of anxiety will decrease Outcome: Progressing   Problem: Elimination: Goal: Will not experience complications related to urinary retention Outcome: Progressing

## 2023-08-30 NOTE — Consult Note (Signed)
 Consultation Note Date: 08/30/2023   Patient Name: Tiffany Rice  DOB: 1932-01-09  MRN: [id number]  Age / Sex: 89 y.o., female  PCP: Tita Form, MD Referring Physician: Cala Castleman, MD  Reason for Consultation: Establishing goals of care  HPI/Patient Profile: 89 y.o. female  with past medical history of CVA on Plavix , CKD stage II, hypothyroidism, essential hypertension, hyperlipidemia and hypothyroidism admitted on 08/27/2023 with fall, generalized weakness, overall decline.  Most notably, she was diagnosed with a subdural hemorrhage (fortunately, felt to be stable at present, holding antiplatelets, OT/PT monitoring), AKI superimposed on CKD stage II in the setting of rhabdomyolysis and acute urinary retention (status post Foley insertion), acute on chronic constipation for which she is being managed with a p.o. bowel regimen.  She also had an ischemic stroke back in March 2024 which required a brief skilled nursing facility stay for rehab.  The most pressing issue at present is her AKI.  Unfortunately, despite full medical support her BUN and creatinine are not improving and in fact had a slight worsening when reviewing labs.  Given plateauing renal function despite intensive therapies, nephrology was consulted, felt her to be a poor dialysis candidate.  Therefore, palliative care was asked to get involved to assist with goals of care.  Tiffany Rice states she is feeling "ok." She denies any pain at rest but did report some mild pain in her ankle/back when working with therapy. When asked how she feels therapy is going she shares she has been doing some but prefers not to participate (due to her personal preference). She speaks of how she had a wonderful life, has wonderful children, and is ready to go (when clarified she confirms she is ready to pass away peacefully). She shares some of her life history and  hobbies (playing bridge, taking pictures, and being with friends/family). With permission we discussed at length her current medical status including the stable subdural hematoma and more pertinent her kidney failure.  She confirms she prefers a comfort approach and to allow a natural death.  She is at peace with this decision and denies any worries or fears at this time.  Declined spiritual care consult.  She is in agreement to stopping IV fluids and daily labs and then recheck in 48 hours to determine next steps.  Family conference held: family present as noted above, we spoke extensively regarding Tiffany Rice and her overall health both during this hospitalization and over the past year/few months. She has been declining recently (eating and drinking less, preferring to remain in her room for meals instead of going to the dining room, increased falls, more trouble walking). She currently lives at [facility] living (ALF).  More recently (over about the past 10 days) she has had several falls and with the most recent fall was complaining of some pain and difficulty recovering.  Therefore, she was sent to the emergency department where she was found to have a small subdural hematoma and AKI in the setting of rhabdo/acute urinary retention.  Given minimal improvement in renal function despite aggressive therapies, nephrology was consulted and dialysis was discussed but she was felt to be a poor candidate.  Additionally, the patient is quite clear that she would not want to undergo intensive therapy such as dialysis.  She also has significant needle phobia and expresses desire to be comfortable and allowing natural death.  Family shares that she has been consistent in these beliefs and we reviewed her previously completed living will.  This confirms her directive to withhold or withdrawal life-prolonging measures in the variety of circumstances.  It also appears she does not wish for artificial hydration  or nutrition.  We had a long discussion about next steps and where we go from here.  Family wants to honor her wishes and is agreement to transition to comfort care including stopping IV fluids and labs and liberalizing diet.  Next, we discussed disposition.  Currently, she is residing at an ALF.  Family is not certain what level of care they can offer at the ALF.  They share that she has stopped feeding herself and is quite dependent.  They would prefer 24-hour end-of-life care in an inpatient hospice. In the meantime, we will discontinue IV fluids and daily labs.  Will plan to recheck labs in 48 hours and determine whether or not referral to inpatient hospice would be appropriate versus disposition to another location.  Family is in agreement and comfortable with this plan.   Clinical Assessment and Goals of Care:  This NP Jenelle Mis reviewed medical records, received report from team, assessed the patient and then meet at the patient's bedside  to discuss diagnosis, prognosis, GOC, EOL wishes disposition and options. Tiffany Rice is seen in the room initially with her family including sons [name], [name]), daughter Aleda Hurl), daughter in law Abe Abed), and son in law [name]).    Concept of Palliative Care was introduced as specialized medical care for people and their families living with serious illness.  If focuses on providing relief from the symptoms and stress of a serious illness.  The goal is to improve quality of life for both the patient and the family. Values and goals of care important to patient and family were attempted to be elicited.   A  discussion was had today regarding advanced directives.  Concepts specific to code status, artifical feeding and hydration, continued IV antibiotics and rehospitalization was had.  The difference between a aggressive medical intervention path  and a palliative comfort care path for this patient at this time was had.     MOST form to reflect full comfort    Natural trajectory and expectations at EOL were discussed. Educated on hospice benefit; philosophy and eligibility. Questions and concerns addressed.  Patient/family  encouraged to call with questions or concerns.     PMT will continue to support holistically. Reassess in 48 hrs for appropriate disposition.     SUMMARY OF RECOMMENDATIONS    Transition to full comfort care  Code Status/Advance Care Planning: DNR/DNI No artificial feeding/hydration now/in future No further life prolonging measures allow a natural death, d/c labs, scans, testing Liberalize diet MOST form completed to reflect full comfort   Symptom Management:  Continue current bowel regimen as it appears effective Will order Tylenol  as needed I educated patient and family at length about the importance of monitoring for and reporting any bothersome symptoms including pain, shortness of breath, nausea, constipation, etc. patient agrees to monitor symptoms and let nursing or family know right away if  she has any bothersome symptoms.    Palliative Prophylaxis:  Bowel Regimen and Frequent Pain Assessment  Additional Recommendations (Limitations, Scope, Preferences): Full Comfort Care  Psycho-social/Spiritual:  Desire for further Chaplaincy support:no Additional Recommendations: Education on Hospice  Prognosis:  Unable to determine need 48 hrs without IV fluids and follow up labs to better determine  Discharge Planning: To Be Determined      Primary Diagnoses: Present on Admission:  Essential hypertension  Hyperlipidemia  Hypothyroidism   I have reviewed the medical record, interviewed the patient and family, and examined the patient. The following aspects are pertinent.  Past Medical History:  Diagnosis Date   Closed fracture of humerus 08/05/2013   pt fell while walking down the street   Complex ovarian cyst 04/05/2009   Left side.  Followed by ultrasound.   Depression    Diverticulosis     Esophageal stricture    GERD (gastroesophageal reflux disease)    Hemorrhoids    Hiatal hernia    HTN (hypertension)    Hyperlipidemia    Hypothyroidism    Obesity    Osteopenia 04/05/2008   Stroke [facility])    Social History   Socioeconomic History   Marital status: Divorced    Spouse name: Not on file   Number of children: 3   Years of education: Not on file   Highest education level: Not on file  Occupational History   Occupation: Retired    Associate Professor: RETIRED  Tobacco Use   Smoking status: Former    Types: Cigarettes   Smokeless tobacco: Never  Vaping Use   Vaping status: Never Used  Substance and Sexual Activity   Alcohol use: No   Drug use: No   Sexual activity: Not Currently    Birth control/protection: Post-menopausal  Other Topics Concern   Not on file  Social History Narrative   Not on file   Social Drivers of Health   Financial Resource Strain: Not on file  Food Insecurity: No Food Insecurity (08/28/2023)   Hunger Vital Sign    Worried About Running Out of Food in the Last Year: Never true    Ran Out of Food in the Last Year: Never true  Transportation Needs: No Transportation Needs (08/30/2023)   PRAPARE - Administrator, Civil Service (Medical): No    Lack of Transportation (Non-Medical): No  Physical Activity: Not on file  Stress: Not on file  Social Connections: Unknown (08/30/2023)   Social Connection and Isolation Panel [NHANES]    Frequency of Communication with Friends and Family: Twice a week    Frequency of Social Gatherings with Friends and Family: Twice a week    Attends Religious Services: Never    Diplomatic Services operational officer: Not on file    Attends Banker Meetings: Never    Marital Status: Patient declined   Family History  Problem Relation Age of Onset   Clotting disorder Father    Depression Father    Breast cancer Mother    Colon cancer Son    Colon polyps Daughter    Hyperlipidemia Son         x2    Hypertension Son        x 2   Hyperlipidemia Daughter    Scheduled Meds:  bisacodyl  10 mg Rectal Q0600   Chlorhexidine  Gluconate Cloth  6 each Topical Daily   docusate sodium   200 mg Oral BID   levothyroxine   75 mcg Oral Daily  pantoprazole   40 mg Oral Daily   polyethylene glycol  17 g Oral BID   senna-docusate  1 tablet Oral BID   Continuous Infusions: PRN Meds:.hydrALAZINE , [DISCONTINUED] ondansetron  **OR** ondansetron  (ZOFRAN ) IV Medications Prior to Admission:  Prior to Admission medications   Medication Sig Start Date End Date Taking? Authorizing Provider  aspirin  EC 81 MG tablet Take 1 tablet (81 mg total) by mouth daily. Swallow whole. 06/11/22  Yes Macdonald Savoy, MD  carvedilol  (COREG ) 6.25 MG tablet Take 1 tablet (6.25 mg total) by mouth 2 (two) times daily with a meal. 06/11/22  Yes Macdonald Savoy, MD  Cholecalciferol (VITAMIN D-3) 25 MCG (1000 UT) CAPS Take 1,000 Units by mouth every evening.   Yes [provider]  levothyroxine  (SYNTHROID ) 75 MCG tablet Take 75 mcg by mouth daily.   Yes [provider]  olmesartan (BENICAR) 40 MG tablet Take 40 mg by mouth daily. 02/22/18  Yes [provider]  potassium chloride (K-DUR) 10 MEQ tablet Take 10 mEq by mouth every evening.   Yes [provider]  rosuvastatin  (CRESTOR ) 20 MG tablet Take 1 tablet (20 mg total) by mouth daily. Patient taking differently: Take 40 mg by mouth daily. 06/11/22  Yes Macdonald Savoy, MD   Allergies  Allergen Reactions   Bactrim [Sulfamethoxazole-Trimethoprim] Other (See Comments)    Mood swings  **Not listed on the Physician's Orders"*    Review of Systems  Constitutional: Negative.     Physical Exam Constitutional:      Appearance: She is ill-appearing.  Pulmonary:     Effort: No respiratory distress.  Neurological:     Mental Status: She is alert.  Psychiatric:        Mood and Affect: Mood normal.        Behavior: Behavior  normal.        Thought Content: Thought content normal.        Judgment: Judgment normal.     Vital Signs: BP (!) 91/56 (BP Location: Right Arm)   Pulse 70   Temp 97.7 F (36.5 C) (Oral)   Resp (!) 28   Ht 5\' 6"  (1.676 m)   Wt 64.8 kg   SpO2 97%   BMI 23.06 kg/m  Pain Scale: 0-10   Pain Score: 0-No pain   SpO2: SpO2: 97 % O2 Device:SpO2: 97 % O2 Flow Rate: .   IO: Intake/output summary:  Intake/Output Summary (Last 24 hours) at 08/30/2023 1454 Last data filed at 08/30/2023 1000 Gross per 24 hour  Intake 120 ml  Output 30 ml  Net 90 ml    LBM: Last BM Date : 08/30/23 Baseline Weight: Weight: 73.5 kg Most recent weight: Weight: 64.8 kg     Palliative Assessment/Data: 40% at best     Time Total: 75 minutes discussed plan with Dr. Zelda Hickman and he agrees with plan to reevaluate in 48 hrs with appropriate dispo    Signed by: Render Carrie, NP   Please contact Palliative Medicine Team phone at [phone] for questions and concerns.  For individual provider: See Tilford Foley

## 2023-08-30 NOTE — TOC Initial Note (Signed)
 Transition of Care [facility]) - Initial/Assessment Note    Patient Details  Name: Tiffany Rice MRN: [id number] Date of Birth: 24-May-1931  [facility]) CM/SW Contact:    Jannice Mends, LCSW Phone Number: 08/30/2023, 4:48 PM  Clinical Narrative:                 Patient admitted from [facility]. CSW continuing to follow for discharge needs.     Barriers to Discharge: Continued Medical Work up   Patient Goals and CMS Choice            Expected Discharge Plan and Services In-house Referral: Clinical Social Work     Living arrangements for the past 2 months: Assisted Living Facility                                      Prior Living Arrangements/Services Living arrangements for the past 2 months: Assisted Living Facility Lives with:: Facility Resident Patient language and need for interpreter reviewed:: Yes Do you feel safe going back to the place where you live?: Yes      Need for Family Participation in Patient Care: Yes (Comment) Care giver support system in place?: Yes (comment)   Criminal Activity/Legal Involvement Pertinent to Current Situation/Hospitalization: No - Comment as needed  Activities of Daily Living   ADL Screening (condition at time of admission) Independently performs ADLs?: No Does the patient have a NEW difficulty with bathing/dressing/toileting/self-feeding that is expected to last >3 days?: Yes (Initiates electronic notice to provider for possible OT consult) Does the patient have a NEW difficulty with getting in/out of bed, walking, or climbing stairs that is expected to last >3 days?: Yes (Initiates electronic notice to provider for possible PT consult) Does the patient have a NEW difficulty with communication that is expected to last >3 days?: No Is the patient deaf or have difficulty hearing?: No Does the patient have difficulty seeing, even when wearing glasses/contacts?: No Does the patient have difficulty  concentrating, remembering, or making decisions?: Yes  Permission Sought/Granted Permission sought to share information with : Facility Medical sales representative, Family Supports    Share Information with NAME: [name]     Permission granted to share info w Relationship: Son  Permission granted to share info w Contact Information: [phone]  Emotional Assessment Appearance:: Appears stated age     Orientation: : Oriented to Self, Oriented to Place, Oriented to  Time, Oriented to Situation Alcohol / Substance Use: Not Applicable Psych Involvement: No (comment)  Admission diagnosis:  Subdural hemorrhage (HCC) [I62.00] AKI (acute kidney injury) (HCC) [N17.9] Traumatic rhabdomyolysis, initial encounter (HCC) [T79.6XXA] Patient Active Problem List   Diagnosis Date Noted   Palliative care by specialist 08/30/2023   End of life care 08/30/2023   DNR (do not resuscitate) 08/30/2023   Subdural hemorrhage (HCC) 08/27/2023   AKI (acute kidney injury) (HCC) 08/27/2023   Rhabdomyolysis 08/27/2023   Hypercalcemia 08/27/2023   Acute urinary retention 08/27/2023   Recurrent falls 08/27/2023   Hyperkalemia 08/27/2023   Transaminitis 08/27/2023   History of CVA (cerebrovascular accident) 08/27/2023   Slurred speech 06/08/2022   Hypertensive emergency 06/08/2022   CVA (cerebral vascular accident) (HCC) 06/08/2022   Cyst of left ovary 03/04/2017   Essential hypertension 02/15/2013   Hyperlipidemia 02/15/2013   Hypothyroidism 02/15/2013   CKD (chronic kidney disease) stage 2, GFR 60-89 ml/min 02/15/2013   Chest pain on exertion  02/15/2013   PCP:  Tita Form, MD Pharmacy:   [facility] [place] - [address] [place] [address] Phone: [phone] Fax: [phone]     Social Drivers of Health (SDOH) Social History: SDOH Screenings   Food Insecurity: No Food Insecurity (08/28/2023)  Housing: Low Risk  (08/30/2023)  Transportation Needs: No Transportation  Needs (08/30/2023)  Utilities: Not At Risk (08/30/2023)  Social Connections: Unknown (08/30/2023)  Tobacco Use: Medium Risk (08/27/2023)   SDOH Interventions:     Readmission Risk Interventions     No data to display

## 2023-08-30 NOTE — Evaluation (Signed)
 Physical Therapy Evaluation Patient Details Name: Tiffany Rice MRN: [id number] DOB: 1932/01/14 Today's Date: 08/30/2023  History of Present Illness  Pt is an 89 y/o female presenting after a fall and impaired mobility at ALF. CT head showed small posterior SDH. Also with AKI, hypercalcemia, rhabdomyolysis and acute urinary retention. PMH: CVA on Plavix , CKD2, hypothyroidism, HTN, HLD.  Clinical Impression  Pt admitted with above diagnosis. Ambulatory at ALF prior to admission. Requires up to max assist for bed mobility today. Progression out of bed limited by lower back pain and requested return to bed. Donned and educated on CAM boot for Lt ankle pain with weight bearing. Performed LE exercises. Encouraged to perform a few times per day, and get OOB as symptoms allow with staff assist only. Patient will benefit from continued inpatient follow up therapy, <3 hours/day. Pt currently with functional limitations due to the deficits listed below (see PT Problem List). Pt will benefit from acute skilled PT to increase their independence and safety with mobility to allow discharge.           If plan is discharge home, recommend the following: Two people to help with walking and/or transfers;Two people to help with bathing/dressing/bathroom;Assistance with cooking/housework;Direct supervision/assist for medications management;Direct supervision/assist for financial management;Assist for transportation;Supervision due to cognitive status   Can travel by private vehicle   No    Equipment Recommendations None recommended by PT  Recommendations for Other Services       Functional Status Assessment Patient has had a recent decline in their functional status and demonstrates the ability to make significant improvements in function in a reasonable and predictable amount of time.     Precautions / Restrictions Precautions Precautions: Fall Recall of Precautions/Restrictions:  Impaired Restrictions Weight Bearing Restrictions Per Provider Order: No      Mobility  Bed Mobility Overal bed mobility: Needs Assistance Bed Mobility: Rolling, Sidelying to Sit, Sit to Sidelying Rolling: Mod assist, Used rails Sidelying to sit: Max assist     Sit to sidelying: Max assist General bed mobility comments: Mod assist to roll, max assist for trunk support and to help sequence LEs off bed. Multimodal cues throughout. Able to lie onto Lt side with assist, bil LEs supported back into bed by therapist. Total assist to scoot up in bed.    Transfers                   General transfer comment: unable to progress due to low back pain.    Ambulation/Gait                  Stairs            Wheelchair Mobility     Tilt Bed    Modified Rankin (Stroke Patients Only)       Balance Overall balance assessment: Needs assistance Sitting-balance support: Feet supported, Bilateral upper extremity supported Sitting balance-Leahy Scale: Poor Sitting balance - Comments: CGA-Min A                                     Pertinent Vitals/Pain Pain Assessment Pain Assessment: Faces Faces Pain Scale: Hurts whole lot Pain Location: Back with sitting up on EOB. Lt ankle throbbing at rest. Pain Descriptors / Indicators: Aching, Guarding, Throbbing Pain Intervention(s): Limited activity within patient's tolerance, Monitored during session, Repositioned (Declined ICE)    Home Living Family/patient expects to be discharged to::  Assisted living                   Additional Comments: from [facility], has a walker- unsure if RW vs Rollator    Prior Function Prior Level of Function : Needs assist;Patient poor historian/Family not available             Mobility Comments: uses walker, walks to dining hall for meals ADLs Comments: pt reports independence with ADLs, standing for showers though unsure of accuracy.     Extremity/Trunk  Assessment   Upper Extremity Assessment Upper Extremity Assessment: Defer to OT evaluation    Lower Extremity Assessment Lower Extremity Assessment: Generalized weakness;LLE deficits/detail;Difficult to assess due to impaired cognition LLE Deficits / Details: Lt ankle TTP, unable to fully assess ligament laxity due to pain with little movement. Nearly able to clear RLE from bed.    Cervical / Trunk Assessment Cervical / Trunk Assessment: Normal  Communication   Communication Communication: Impaired Factors Affecting Communication: Hearing impaired    Cognition Arousal: Alert Behavior During Therapy: Flat affect   PT - Cognitive impairments: No family/caregiver present to determine baseline, Orientation, Awareness, Memory, Initiation, Problem solving, Safety/Judgement, Sequencing   Orientation impairments: Place, Situation, Time                   PT - Cognition Comments: Unsure of location, provided incorrect year "2035" Following commands: Impaired Following commands impaired: Follows one step commands with increased time, Follows multi-step commands inconsistently     Cueing Cueing Techniques: Verbal cues, Gestural cues, Tactile cues, Visual cues     General Comments General comments (skin integrity, edema, etc.): Donned CAM boot per order.VSS, pt declines further progession with therapy due to back pain.    Exercises General Exercises - Lower Extremity Ankle Circles/Pumps: AROM, Both, 10 reps, Supine Quad Sets: Strengthening, Both, 10 reps, Supine Gluteal Sets: Strengthening, Both, 10 reps, Supine   Assessment/Plan    PT Assessment Patient needs continued PT services  PT Problem List Decreased strength;Decreased range of motion;Decreased activity tolerance;Decreased balance;Decreased mobility;Decreased cognition;Decreased knowledge of use of DME;Decreased safety awareness;Decreased knowledge of precautions;Cardiopulmonary status limiting activity;Impaired  sensation;Pain       PT Treatment Interventions DME instruction;Gait training;Functional mobility training;Therapeutic activities;Therapeutic exercise;Balance training;Neuromuscular re-education;Cognitive remediation;Patient/family education;Wheelchair mobility training;Modalities    PT Goals (Current goals can be found in the Care Plan section)  Acute Rehab PT Goals Patient Stated Goal: none stated. PT Goal Formulation: Patient unable to participate in goal setting Time For Goal Achievement: 09/13/23 Potential to Achieve Goals: Fair    Frequency Min 2X/week     Co-evaluation               AM-PAC PT "6 Clicks" Mobility  Outcome Measure Help needed turning from your back to your side while in a flat bed without using bedrails?: A Lot Help needed moving from lying on your back to sitting on the side of a flat bed without using bedrails?: A Lot Help needed moving to and from a bed to a chair (including a wheelchair)?: Total Help needed standing up from a chair using your arms (e.g., wheelchair or bedside chair)?: Total Help needed to walk in hospital room?: Total Help needed climbing 3-5 steps with a railing? : Total 6 Click Score: 8    End of Session   Activity Tolerance: Patient limited by pain Patient left: in bed;with call bell/phone within reach;with bed alarm set   PT Visit Diagnosis: History of falling (Z91.81);Muscle weakness (generalized) (  M62.81);Difficulty in walking, not elsewhere classified (R26.2);Other symptoms and signs involving the nervous system (R29.898);Pain Pain - Right/Left: Left Pain - part of body: Ankle and joints of foot (and back)    Time: 0347-4259 PT Time Calculation (min) (ACUTE ONLY): 20 min   Charges:   PT Evaluation $PT Eval Moderate Complexity: 1 Mod   PT General Charges $$ ACUTE PT VISIT: 1 Visit         Jory Ng, PT, DPT [facility]  Rehabilitation Services Physical Therapist Office: [phone] Website:  [email or web address]   Alinda Irani 08/30/2023, 1:20 PM

## 2023-08-30 NOTE — Care Management Important Message (Signed)
 Important Message  Patient Details  Name: Tiffany Rice MRN: [id number] Date of Birth: May 23, 1931   Important Message Given:  Yes - Medicare IM     Felix Host 08/30/2023, 3:44 PM

## 2023-08-30 NOTE — Progress Notes (Signed)
 [facility] Progress Note   Assessment/ Plan:    AKI: severe AKI in the setting of mild rhabdo, constipation, dehydration, urinary retention.               - s/p gentle hydration             - likely multifactorial insults which have created a dense AKI             - making some urine             - she is a poor dialysis candidate- son [name] agrees with this             - no sig improvement- pall care consulted, appreciate  - nothing else to add- will sign off.  Call with questions. Would leave the foley for now.   2.  SDH:             - nonop per NSG   3.  Rhabdo:             - IVFs   4.  FTT: longstanding issue-- mobility and decreased appetite is the root of this issue   5.  Dispo: pending  Subjective:    Seen in room, sleeping.  No issues.  Cr has not improved.     Objective:   BP (!) 91/56 (BP Location: Right Arm)   Pulse 70   Temp 97.7 F (36.5 C) (Oral)   Resp (!) 28   Ht 5\' 6"  (1.676 m)   Wt 64.8 kg   SpO2 97%   BMI 23.06 kg/m   Physical Exam: GEN NAD, sleeping HEENT EOMI PERRL NECK no JVD PULM clear CV RRR ABD soft EXT no LE edema NEURO sleeping, arousable  SKIN  better turgor  Labs: BMET Recent Labs  Lab 08/27/23 1830 08/28/23 0330 08/29/23 0425 08/30/23 0411  NA 141 140  139 138 136  K 5.5* 4.9  4.8 4.2 4.1  CL 110 112*  109 110 108  CO2 17* 17*  16* 18* 19*  GLUCOSE 115* 91  88 101* 87  BUN 84* 81*  82* 82* 84*  CREATININE 5.89* 5.40*  5.38* 5.64* 5.73*  CALCIUM  10.5* 9.6  9.4 9.1 8.9  PHOS  --   --  3.9 2.8   CBC Recent Labs  Lab 08/27/23 1830 08/28/23 0330 08/29/23 0425 08/30/23 0411  WBC 12.8* 10.7* 12.0* 10.5  NEUTROABS 10.8*  --  10.1* 8.3*  HGB 13.5 11.6* 11.3* 10.5*  HCT 40.3 34.6* 33.3* 31.0*  MCV 91.0 91.3 89.8 88.8  PLT 148* 135* 130* 120*      Medications:     bisacodyl  10 mg Rectal Q0600   Chlorhexidine  Gluconate Cloth  6 each Topical Daily   docusate sodium   200 mg Oral BID    levothyroxine   75 mcg Oral Daily   pantoprazole  40 mg Oral Daily   polyethylene glycol  17 g Oral BID   senna-docusate  1 tablet Oral BID     Leandra Pro MD 08/30/2023, 1:34 PM

## 2023-08-30 NOTE — Progress Notes (Signed)
 PROGRESS NOTE                                                                                                                                                                                                             Patient Demographics:    Tiffany Rice, is a 89 y.o. female, DOB - Nov 25, 1931, BJY:[id number]  Outpatient Primary MD for the patient is Tita Form, MD    LOS - 3  Admit date - 08/27/2023    Chief Complaint  Patient presents with   Fall   Altered Mental Status       Brief Narrative (HPI from H&P)   89 y.o. female with medical history significant of CVA on Plavix , CKD stage II, hypothyroidism, essential hypertension, hyperlipidemia and hypothyroidism presented emergency department complaining for fall and since the fall patient is unable to bear weight.  At baseline patient ambulates.  Patient is also complaining about decreased appetite since the fall and cognitive decline.  Family reported that over the course of last 1 week patient has more generalized weakness and that causing recurrent fall. During my evaluation at the bedside patient is alert oriented x 4.  Patient's son at the bedside reported that since fluid has been started patient is looking much better and hydrated.  Patient denies any headache, blurry vision, nausea, chest pain, abdominal pain, diarrhea and urinary symptoms.  Reporting ongoing constipation.     Subjective:   Patient in bed, appears comfortable, denies any headache, no fever, no chest pain or pressure, no shortness of breath , no abdominal pain. No focal weakness.   Assessment  & Plan :   Unsteady gait, generalized weakness with multiple falls at assisted living.  Causing subdural hematoma.  case discussed with neurosurgery by me this morning, CT head x 2 stable, no headache or focal deficits, hold antiplatelets for 2 weeks, PT OT and monitor clinically.  Require placement.    Acute kidney injury superimposed on CKD stage II - Rhabdomyolysis - Acute urinary retention -  Elevated creatinine 5.89.  At baseline patient's GFR above 60.  CT abdomen pelvis also showing bladder fullness and urinary tension around 1 L, also had rhabdomyolysis.  Hydrate, Foley placed, Flomax added, renal ultrasound nonacute.  Continue to monitor IV fluids, will get nephrology input as well.  Poor candidate for HD.  Since renal function  has plateaued around 5.5 without much improvement for the last 3 days will involve palliative care for goals of care discussions as well.   Hypercalcemia - due to dehydration, hydrated and stabilized   Essential hypertension  - as needed hydralazine   Hypothyroidism  - Continue levothyroxine  75 mcg daily.     Transaminitis  - Elevated AST/ALT 370/106.  Normal bilirubin and alkaline phosphatase level.  Likely due to rhabdomyolysis monitor hold statin.  CT abdomen pelvis stable from this aspect.   History of CVA  - Holding aspirin  and Plavix  in the setting of subdural hemorrhage.    Chronic constipation  - While in the ED patient already has 1 bowel movement.  Continue MiraLAX and Senokot twice daily.  Working with PT patient reported some left ankle pain.  Strain nonacute, likely soft tissue injury, supportive care, cam boot when bearing weight.  Incidental finding of left ovarian cyst and right middle lobe lung nodule.  Outpatient age-appropriate follow-up by PCP.        Condition - Extremely Guarded  Family Communication  :   Discussed with son [name] [phone]  in detail on 08/28/2023, message left on 08/29/2023 at 9:43 AM, called [phone] on 08/29/2023 at 9:44 AM, no response, called 08/30/2023 at 8:49 AM.  Over 10 rings.  No response.  Called and updated son [name] [phone]  on 08/29/2023, called 08/30/2023 at 8:49 AM.  Shelah Derry to answering machine, message left  Code Status : DNR  Consults  : Neurosurgery  PUD Prophylaxis : PPI   Procedures  :      CT chest abdomen pelvis.  1. Moderate stool ball in the rectum with perirectal fat stranding, suggestive of fecal impaction developing stercoral colitis. 2. Complex cystic lesion with thin septation in the left ovary measuring 6.6 cm. This measured 4.6 cm on 02/02/2016. Given slow interval growth over 8 years this is likely benign however low-grade cystic neoplasm is not excluded. This could be followed with ultrasound in 6-12 months if clinically appropriate given comorbidities and age. 3. 5 mm nodule in the right middle lobe along the minor fissure. No follow-up needed if patient is low-risk. 4. Moderate hiatal hernia. 5. Aortic Atherosclerosis  CT head.  Trace subdural hemorrhage along the posterior falx measuring 4 mm in thickness (series 3/image 14). No mass effect or midline shift. No evidence of acute infarct. No hydrocephalus. No extra-axial fluid collection. Age-commensurate cerebral atrophy and chronic small vessel ischemic disease. Vascular: No hyperdense vessel. Intracranial arterial calcification. 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   CT C-spine. 1. No acute fracture in the cervical spine. 2. Frothy debris in the esophagus reaching above the thoracic inlet, which is an aspiration risk. Critical Value/emergent results were called by telephone at the time of interpretation on 08/27/2023 at 7:22 pm to provider M[facility] , who verbally acknowledged these results   Renal ultrasound.  Nonacute.      Disposition Plan  :    Status is: Inpatient   DVT Prophylaxis  :    SCDs Start: 08/27/23 2129 Place TED hose Start: 08/27/23 2129    Lab Results  Component Value Date   PLT 120 (L) 08/30/2023    Diet :  Diet Order             Diet Heart Room service appropriate? Yes; Fluid consistency: Thin  Diet effective now                    Inpatient Medications  Scheduled Meds:  bisacodyl  10 mg Rectal Q0600   Chlorhexidine  Gluconate Cloth  6 each Topical Daily    docusate sodium   200 mg Oral BID   levothyroxine   75 mcg Oral Daily   pantoprazole  40 mg Oral Daily   polyethylene glycol  17 g Oral BID   senna-docusate  1 tablet Oral BID   sodium bicarbonate  650 mg Oral TID   Continuous Infusions:  lactated ringers 75 mL/hr at 08/29/23 1039   PRN Meds:.hydrALAZINE, [DISCONTINUED] ondansetron **OR** ondansetron (ZOFRAN) IV  Antibiotics  :    Anti-infectives (From admission, onward)    None         Objective:   Vitals:   08/29/23 1900 08/29/23 2248 08/30/23 0400 08/30/23 0826  BP: (!) 90/59 110/60 101/65 114/81  Pulse:  77 76 74  Resp: (!) 25 (!) 25 (!) 25 (!) 25  Temp: 97.8 F (36.6 C) 98.8 F (37.1 C) 98.3 F (36.8 C) 97.7 F (36.5 C)  TempSrc: Oral Axillary Axillary Oral  SpO2: 98% 93% 94% (!) 86%  Weight:      Height:        Wt Readings from Last 3 Encounters:  08/28/23 64.8 kg  07/20/22 75.1 kg  06/08/22 76.7 kg     Intake/Output Summary (Last 24 hours) at 08/30/2023 0849 Last data filed at 08/29/2023 2211 Gross per 24 hour  Intake --  Output 130 ml  Net -130 ml     Physical Exam  Awake Alert, No new F.N deficits, Normal affect [place].AT,PERRAL Supple Neck, No JVD,   Symmetrical Chest wall movement, Good air movement bilaterally, CTAB RRR,No Gallops,Rubs or new Murmurs,  +ve B.Sounds, Abd Soft, No tenderness,   No Cyanosis, Clubbing or edema, Foley       Data Review:    Recent Labs  Lab 08/27/23 1830 08/28/23 0330 08/29/23 0425 08/30/23 0411  WBC 12.8* 10.7* 12.0* 10.5  HGB 13.5 11.6* 11.3* 10.5*  HCT 40.3 34.6* 33.3* 31.0*  PLT 148* 135* 130* 120*  MCV 91.0 91.3 89.8 88.8  MCH 30.5 30.6 30.5 30.1  MCHC 33.5 33.5 33.9 33.9  RDW 13.8 14.0 13.8 13.9  LYMPHSABS 0.5*  --  0.4* 0.7  MONOABS 1.5*  --  1.4* 1.3*  EOSABS 0.0  --  0.0 0.1  BASOSABS 0.0  --  0.0 0.0    Recent Labs  Lab 08/27/23 1830 08/28/23 0330 08/29/23 0425 08/30/23 0411  NA 141 140  139 138 136  K 5.5* 4.9  4.8 4.2 4.1   CL 110 112*  109 110 108  CO2 17* 17*  16* 18* 19*  ANIONGAP 14 11  14 10 9   GLUCOSE 115* 91  88 101* 87  BUN 84* 81*  82* 82* 84*  CREATININE 5.89* 5.40*  5.38* 5.64* 5.73*  AST 317* 258*  --   --   ALT 160* 136*  --   --   ALKPHOS 60 51  --   --   BILITOT 1.0 1.1  --   --   ALBUMIN 3.1* 2.5*  --   --   INR  --  1.2  --   --   MG  --   --  1.8 1.7  PHOS  --   --  3.9 2.8  CALCIUM  10.5* 9.6  9.4 9.1 8.9      Recent Labs  Lab 08/27/23 1830 08/28/23 0330 08/29/23 0425 08/30/23 0411  INR  --  1.2  --   --  MG  --   --  1.8 1.7  CALCIUM  10.5* 9.6  9.4 9.1 8.9    --------------------------------------------------------------------------------------------------------------- Lab Results  Component Value Date   CHOL 206 (H) 06/09/2022   HDL 48 06/09/2022   LDLCALC 141 (H) 06/09/2022   TRIG 84 06/09/2022   CHOLHDL 4.3 06/09/2022    Lab Results  Component Value Date   HGBA1C 5.6 06/08/2022   No results for input(s): "TSH", "T4TOTAL", "FREET4", "T3FREE", "THYROIDAB" in the last 72 hours. No results for input(s): "VITAMINB12", "FOLATE", "FERRITIN", "TIBC", "IRON", "RETICCTPCT" in the last 72 hours. ------------------------------------------------------------------------------------------------------------------ Cardiac Enzymes No results for input(s): "CKMB", "TROPONINI", "MYOGLOBIN" in the last 168 hours.  Invalid input(s): "CK"  Micro Results No results found for this or any previous visit (from the past 240 hours).  Radiology Report DG Ankle Complete Left Result Date: 08/29/2023 CLINICAL DATA:  [id number] Left ankle pain [id number] EXAM: LEFT ANKLE COMPLETE - 3 VIEW COMPARISON:  None Available. FINDINGS: There is no evidence of fracture, dislocation, or subluxation. There is no evidence of arthropathy or other focal bone abnormality. Osseous structures are osteopenic. Soft tissue swelling noted at the ankle mortise. IMPRESSION: Ankle mortise Soft tissue swelling. No  acute osseous abnormalities. Electronically Signed   By: Sydell Eva M.D.   On: 08/29/2023 11:09     Signature  -   Lynnwood Sauer M.D on 08/30/2023 at 8:49 AM   -  To page go to [email or web address]

## 2023-08-31 DIAGNOSIS — I6200 Nontraumatic subdural hemorrhage, unspecified: Secondary | ICD-10-CM | POA: Diagnosis not present

## 2023-08-31 LAB — BASIC METABOLIC PANEL WITH GFR
Anion gap: 7 (ref 5–15)
BUN: 88 mg/dL — ABNORMAL HIGH (ref 8–23)
CO2: 18 mmol/L — ABNORMAL LOW (ref 22–32)
Calcium: 8.8 mg/dL — ABNORMAL LOW (ref 8.9–10.3)
Chloride: 107 mmol/L (ref 98–111)
Creatinine, Ser: 6.2 mg/dL — ABNORMAL HIGH (ref 0.44–1.00)
GFR, Estimated: 6 mL/min — ABNORMAL LOW (ref >60)
Glucose, Bld: 89 mg/dL (ref 70–99)
Potassium: 4.4 mmol/L (ref 3.5–5.1)
Sodium: 132 mmol/L — ABNORMAL LOW (ref 135–145)

## 2023-08-31 MED ORDER — ORAL CARE MOUTH RINSE
15.0000 mL | OROMUCOSAL | Status: DC | PRN
Start: 2023-08-31 — End: 2023-09-05

## 2023-08-31 NOTE — Care Management Important Message (Signed)
 Important Message  Patient Details  Name: Tiffany Rice MRN: [id number] Date of Birth: 06-22-1931   Important Message Given:  Yes - Medicare IM     Wynonia Hedges 08/31/2023, 5:09 PM

## 2023-08-31 NOTE — Plan of Care (Signed)
 Patient is transitioning to comfort care.  Vitals remain stable.  Family at bedside.

## 2023-08-31 NOTE — Progress Notes (Signed)
 PROGRESS NOTE                                                                                                                                                                                                             Patient Demographics:    Tiffany Rice, is a 89 y.o. female, DOB - 1931-07-05, ZOX:[id number]  Outpatient Primary MD for the patient is Tiffany Form, MD    LOS - 4  Admit date - 08/27/2023    Chief Complaint  Patient presents with   Fall   Altered Mental Status       Brief Narrative (HPI from H&P)     89 y.o. female with medical history significant of CVA on Plavix , CKD stage II, hypothyroidism, essential hypertension, hyperlipidemia and hypothyroidism presented emergency department complaining for fall and since the fall patient is unable to bear weight.  At baseline patient ambulates.  Patient is also complaining about decreased appetite since the fall and cognitive decline.  Family reported that over the course of last 1 week patient has more generalized weakness and that causing recurrent fall. During my evaluation at the bedside patient is alert oriented x 4.  Patient's son at the bedside reported that since fluid has been started patient is looking much better and hydrated.  Patient denies any headache, blurry vision, nausea, chest pain, abdominal pain, diarrhea and urinary symptoms.  Reporting ongoing constipation.     Subjective:   Patient in bed, appears comfortable, denies any complaints this morning, reports good night sleep      Assessment  & Plan :   Unsteady gait, generalized weakness with multiple falls at assisted living.  Causing subdural hematoma.   - Previous MD discussed with neurosurgery, CT head x 2 stable, no headache or focal deficits, hold antiplatelets for 2 weeks, PT OT and monitor clinically.    Acute kidney injury superimposed on CKD stage II - Rhabdomyolysis - Acute urinary  retention  -severe AKI in the setting of mild rhabdo, constipation, dehydration, urinary retention.  - Multifactorial renal insults, which contributed to her dense AKI, she is very poor candidate for dialysis. - Palliative medicine input greatly appreciated, [name] did not wish for dialysis on any life-prolonging measures, he is currently on comfort measures.    Hypercalcemia - due to dehydration, hydrated and stabilized   Essential hypertension  -  as needed hydralazine   Hypothyroidism  - Continue levothyroxine  75 mcg daily.     Transaminitis  - Elevated AST/ALT 370/106.  Normal bilirubin and alkaline phosphatase level.  Likely due to rhabdomyolysis monitor hold statin.  CT abdomen pelvis stable from this aspect.   History of CVA  - Holding aspirin  and Plavix  in the setting of subdural hemorrhage.    Chronic constipation  - While in the ED patient already has 1 bowel movement.  Continue MiraLAX and Senokot twice daily.   left ankle pain.   -Strain nonacute, likely soft tissue injury, supportive care, cam boot when bearing weight.  Incidental finding of left ovarian cyst and right middle lobe lung nodule.   - Further follow-up as an outpatient given she is comfort measures  Goals of care discussion: - Palliative medicine input greatly appreciated, currently patient is on full comfort measures, will await further recommendation about disposition recommendation after palliative medicine further discussion with family        Condition - Extremely Guarded  Family Communication  :   Discussed with son [name] [phone]  in detail on 08/28/2023, message left on 08/29/2023 at 9:43 AM, called [phone] on 08/29/2023 at 9:44 AM, no response, called 08/30/2023 at 8:49 AM.  Over 10 rings.  No response.  Called and updated son [name] [phone]  on 08/29/2023, called 08/30/2023 at 8:49 AM.  Shelah Derry to answering machine, message left  Code Status : DNR  Consults  : Neurosurgery  PUD Prophylaxis  : PPI   Procedures  :     CT chest abdomen pelvis.  1. Moderate stool ball in the rectum with perirectal fat stranding, suggestive of fecal impaction developing stercoral colitis. 2. Complex cystic lesion with thin septation in the left ovary measuring 6.6 cm. This measured 4.6 cm on 02/02/2016. Given slow interval growth over 8 years this is likely benign however low-grade cystic neoplasm is not excluded. This could be followed with ultrasound in 6-12 months if clinically appropriate given comorbidities and age. 3. 5 mm nodule in the right middle lobe along the minor fissure. No follow-up needed if patient is low-risk. 4. Moderate hiatal hernia. 5. Aortic Atherosclerosis  CT head.  Trace subdural hemorrhage along the posterior falx measuring 4 mm in thickness (series 3/image 14). No mass effect or midline shift. No evidence of acute infarct. No hydrocephalus. No extra-axial fluid collection. Age-commensurate cerebral atrophy and chronic small vessel ischemic disease. Vascular: No hyperdense vessel. Intracranial arterial calcification. 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   CT C-spine. 1. No acute fracture in the cervical spine. 2. Frothy debris in the esophagus reaching above the thoracic inlet, which is an aspiration risk. Critical Value/emergent results were called by telephone at the time of interpretation on 08/27/2023 at 7:22 pm to provider MADISON [facility] , who verbally acknowledged these results   Renal ultrasound.  Nonacute.      Disposition Plan  :    Status is: Inpatient   DVT Prophylaxis  :    SCDs Start: 08/27/23 2129    Lab Results  Component Value Date   PLT 120 (L) 08/30/2023    Diet :  Diet Order             Diet regular Room service appropriate? Yes; Fluid consistency: Thin  Diet effective now                    Inpatient Medications  Scheduled Meds:  Chlorhexidine  Gluconate Cloth  6 each Topical  Daily   docusate sodium   200 mg Oral BID    pantoprazole  40 mg Oral Daily   polyethylene glycol  17 g Oral BID   senna-docusate  1 tablet Oral BID   Continuous Infusions:   PRN Meds:.acetaminophen , hydrALAZINE, [DISCONTINUED] ondansetron **OR** ondansetron (ZOFRAN) IV, mouth rinse  Antibiotics  :    Anti-infectives (From admission, onward)    None         Objective:   Vitals:   08/31/23 0340 08/31/23 0400 08/31/23 0752 08/31/23 1211  BP: (!) 106/44 (!) 97/49 (!) 94/52 (!) 96/54  Pulse: 80 76 76   Resp: (!) 23 (!) 22 (!) 23   Temp:  98.1 F (36.7 C) 98.5 F (36.9 C) (!) 97.4 F (36.3 C)  TempSrc:  Oral Oral Oral  SpO2: 94% 95% 97%   Weight:      Height:        Wt Readings from Last 3 Encounters:  08/28/23 64.8 kg  07/20/22 75.1 kg  06/08/22 76.7 kg     Intake/Output Summary (Last 24 hours) at 08/31/2023 1311 Last data filed at 08/31/2023 0630 Gross per 24 hour  Intake 120 ml  Output 650 ml  Net -530 ml     Physical Exam  Weight, alert, no apparent distress  Good air entry bilaterally abdomen soft Extremities with no edema         Data Review:    Recent Labs  Lab 08/27/23 1830 08/28/23 0330 08/29/23 0425 08/30/23 0411  WBC 12.8* 10.7* 12.0* 10.5  HGB 13.5 11.6* 11.3* 10.5*  HCT 40.3 34.6* 33.3* 31.0*  PLT 148* 135* 130* 120*  MCV 91.0 91.3 89.8 88.8  MCH 30.5 30.6 30.5 30.1  MCHC 33.5 33.5 33.9 33.9  RDW 13.8 14.0 13.8 13.9  LYMPHSABS 0.5*  --  0.4* 0.7  MONOABS 1.5*  --  1.4* 1.3*  EOSABS 0.0  --  0.0 0.1  BASOSABS 0.0  --  0.0 0.0    Recent Labs  Lab 08/27/23 1830 08/28/23 0330 08/29/23 0425 08/30/23 0411 08/31/23 0351  NA 141 140  139 138 136 132*  K 5.5* 4.9  4.8 4.2 4.1 4.4  CL 110 112*  109 110 108 107  CO2 17* 17*  16* 18* 19* 18*  ANIONGAP 14 11  14 10 9 7   GLUCOSE 115* 91  88 101* 87 89  BUN 84* 81*  82* 82* 84* 88*  CREATININE 5.89* 5.40*  5.38* 5.64* 5.73* 6.20*  AST 317* 258*  --   --   --   ALT 160* 136*  --   --   --   ALKPHOS 60 51  --   --    --   BILITOT 1.0 1.1  --   --   --   ALBUMIN 3.1* 2.5*  --   --   --   INR  --  1.2  --   --   --   MG  --   --  1.8 1.7  --   PHOS  --   --  3.9 2.8  --   CALCIUM  10.5* 9.6  9.4 9.1 8.9 8.8*      Recent Labs  Lab 08/27/23 1830 08/28/23 0330 08/29/23 0425 08/30/23 0411 08/31/23 0351  INR  --  1.2  --   --   --   MG  --   --  1.8 1.7  --   CALCIUM  10.5* 9.6  9.4 9.1 8.9 8.8*    ---------------------------------------------------------------------------------------------------------------  Lab Results  Component Value Date   CHOL 206 (H) 06/09/2022   HDL 48 06/09/2022   LDLCALC 141 (H) 06/09/2022   TRIG 84 06/09/2022   CHOLHDL 4.3 06/09/2022    Lab Results  Component Value Date   HGBA1C 5.6 06/08/2022   No results for input(s): "TSH", "T4TOTAL", "FREET4", "T3FREE", "THYROIDAB" in the last 72 hours. No results for input(s): "VITAMINB12", "FOLATE", "FERRITIN", "TIBC", "IRON", "RETICCTPCT" in the last 72 hours. ------------------------------------------------------------------------------------------------------------------ Cardiac Enzymes No results for input(s): "CKMB", "TROPONINI", "MYOGLOBIN" in the last 168 hours.  Invalid input(s): "CK"  Micro Results No results found for this or any previous visit (from the past 240 hours).  Radiology Report No results found.    Signature  -   Seena Dadds M.D on 08/31/2023 at 1:11 PM   -  To page go to [email or web address]

## 2023-09-01 DIAGNOSIS — I6200 Nontraumatic subdural hemorrhage, unspecified: Secondary | ICD-10-CM | POA: Diagnosis not present

## 2023-09-01 DIAGNOSIS — R627 Adult failure to thrive: Secondary | ICD-10-CM

## 2023-09-01 DIAGNOSIS — N179 Acute kidney failure, unspecified: Secondary | ICD-10-CM | POA: Diagnosis not present

## 2023-09-01 DIAGNOSIS — Z7189 Other specified counseling: Secondary | ICD-10-CM

## 2023-09-01 LAB — BASIC METABOLIC PANEL WITH GFR
Anion gap: 11 (ref 5–15)
BUN: 99 mg/dL — ABNORMAL HIGH (ref 8–23)
CO2: 17 mmol/L — ABNORMAL LOW (ref 22–32)
Calcium: 9.3 mg/dL (ref 8.9–10.3)
Chloride: 105 mmol/L (ref 98–111)
Creatinine, Ser: 6.73 mg/dL — ABNORMAL HIGH (ref 0.44–1.00)
GFR, Estimated: 5 mL/min — ABNORMAL LOW (ref >60)
Glucose, Bld: 135 mg/dL — ABNORMAL HIGH (ref 70–99)
Potassium: 4.5 mmol/L (ref 3.5–5.1)
Sodium: 133 mmol/L — ABNORMAL LOW (ref 135–145)

## 2023-09-01 NOTE — Progress Notes (Signed)
 PROGRESS NOTE                                                                                                                                                                                                             Patient Demographics:    Tiffany Rice, is a 89 y.o. female, DOB - Apr 18, 1931, ZOX:[id number]  Outpatient Primary MD for the patient is Tita Form, MD    LOS - 5  Admit date - 08/27/2023    Chief Complaint  Patient presents with   Fall   Altered Mental Status       Brief Narrative (HPI from H&P)     89 y.o. female with medical history significant of CVA on Plavix , CKD stage II, hypothyroidism, essential hypertension, hyperlipidemia and hypothyroidism presented emergency department complaining for fall and since the fall patient is unable to bear weight.  At baseline patient ambulates.  Patient is also complaining about decreased appetite since the fall and cognitive decline.  Family reported that over the course of last 1 week patient has more generalized weakness and that causing recurrent fall. During my evaluation at the bedside patient is alert oriented x 4.  Patient's son at the bedside reported that since fluid has been started patient is looking much better and hydrated.  Patient denies any headache, blurry vision, nausea, chest pain, abdominal pain, diarrhea and urinary symptoms.  Reporting ongoing constipation.     Subjective:   Patient in bed, appears comfortable, denies any complaints this morning, reports good night sleep, she has a poor appetite     Assessment  & Plan :   Unsteady gait, generalized weakness with multiple falls at assisted living.  Causing subdural hematoma.   - Previous MD discussed with neurosurgery, CT head x 2 stable, no headache or focal deficits, hold antiplatelets for 2 weeks.   Acute kidney injury superimposed on CKD stage II - Rhabdomyolysis - Acute urinary  retention , uremia -severe AKI in the setting of mild rhabdo, constipation, dehydration, urinary retention.  - Multifactorial renal insults, which contributed to her dense AKI, she is very poor candidate for dialysis. - Palliative medicine input greatly appreciated, patient and family did not wish for dialysis on any life-prolonging measures, she is currently on comfort measures. - Labs has been monitored intermittently no stick quantification, creatinine continue to trend up, as  well BUN continue to trend up.   Hypercalcemia - due to dehydration, hydrated and stabilized   Essential hypertension  - as needed hydralazine    Hypothyroidism  - Continue levothyroxine  75 mcg daily.     Transaminitis  - Elevated AST/ALT 370/106.  Normal bilirubin and alkaline phosphatase level.  Likely due to rhabdomyolysis monitor hold statin.  CT abdomen pelvis stable from this aspect.   History of CVA  - Holding aspirin  and Plavix  in the setting of subdural hemorrhage.    Chronic constipation  - While in the ED patient already has 1 bowel movement.  Continue MiraLAX  and Senokot twice daily.   left ankle pain.   -Strain nonacute, likely soft tissue injury, supportive care, cam boot when bearing weight.  Incidental finding of left ovarian cyst and right middle lobe lung nodule.   - Further follow-up as an outpatient given she is comfort measures  Goals of care discussion: - Palliative medicine input greatly appreciated, currently patient is on full comfort measures, will await further recommendation about disposition recommendation after palliative medicine further discussion with family, with worsening creatinine and uremia, has very poor prognosis and will be a good candidate for residential hospice        Condition - Extremely Guarded  Family Communication  :  Updated by palliative medicine  Code Status : DNR  Consults  : Neurosurgery  PUD Prophylaxis : PPI   Procedures  :     CT chest abdomen  pelvis.  1. Moderate stool ball in the rectum with perirectal fat stranding, suggestive of fecal impaction developing stercoral colitis. 2. Complex cystic lesion with thin septation in the left ovary measuring 6.6 cm. This measured 4.6 cm on 02/02/2016. Given slow interval growth over 8 years this is likely benign however low-grade cystic neoplasm is not excluded. This could be followed with ultrasound in 6-12 months if clinically appropriate given comorbidities and age. 3. 5 mm nodule in the right middle lobe along the minor fissure. No follow-up needed if patient is low-risk. 4. Moderate hiatal hernia. 5. Aortic Atherosclerosis  CT head.  Trace subdural hemorrhage along the posterior falx measuring 4 mm in thickness (series 3/image 14). No mass effect or midline shift. No evidence of acute infarct. No hydrocephalus. No extra-axial fluid collection. Age-commensurate cerebral atrophy and chronic small vessel ischemic disease. Vascular: No hyperdense vessel. Intracranial arterial calcification. 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   CT C-spine. 1. No acute fracture in the cervical spine. 2. Frothy debris in the esophagus reaching above the thoracic inlet, which is an aspiration risk. Critical Value/emergent results were called by telephone at the time of interpretation on 08/27/2023 at 7:22 pm to provider MADISON [facility] , who verbally acknowledged these results   Renal ultrasound.  Nonacute.      Disposition Plan  :    Status is: Inpatient   DVT Prophylaxis  :    SCDs Start: 08/27/23 2129    Lab Results  Component Value Date   PLT 120 (L) 08/30/2023    Diet :  Diet Order             Diet regular Room service appropriate? Yes; Fluid consistency: Thin  Diet effective now                    Inpatient Medications  Scheduled Meds:  Chlorhexidine  Gluconate Cloth  6 each Topical Daily   docusate sodium   200 mg Oral BID   pantoprazole   40 mg Oral  Daily   polyethylene glycol   17 g Oral BID   senna-docusate  1 tablet Oral BID   Continuous Infusions:   PRN Meds:.acetaminophen , hydrALAZINE, [DISCONTINUED] ondansetron **OR** ondansetron (ZOFRAN) IV, mouth rinse  Antibiotics  :    Anti-infectives (From admission, onward)    None         Objective:   Vitals:   08/31/23 1602 08/31/23 2036 08/31/23 2318 09/01/23 1237  BP: (!) 92/52 (!) 114/57 (!) 112/51 (!) 107/53  Pulse:  70  68  Resp:  17    Temp: 97.6 F (36.4 C) 98.7 F (37.1 C) 98.4 F (36.9 C)   TempSrc: Oral Oral Oral   SpO2:  96%    Weight:      Height:        Wt Readings from Last 3 Encounters:  08/28/23 64.8 kg  07/20/22 75.1 kg  06/08/22 76.7 kg     Intake/Output Summary (Last 24 hours) at 09/01/2023 1416 Last data filed at 09/01/2023 1241 Gross per 24 hour  Intake 120 ml  Output 465 ml  Net -345 ml     Physical Exam  Awake, Frail, deconditioned, good air entry, abdomen soft, extremities with no edema        Data Review:    Recent Labs  Lab 08/27/23 1830 08/28/23 0330 08/29/23 0425 08/30/23 0411  WBC 12.8* 10.7* 12.0* 10.5  HGB 13.5 11.6* 11.3* 10.5*  HCT 40.3 34.6* 33.3* 31.0*  PLT 148* 135* 130* 120*  MCV 91.0 91.3 89.8 88.8  MCH 30.5 30.6 30.5 30.1  MCHC 33.5 33.5 33.9 33.9  RDW 13.8 14.0 13.8 13.9  LYMPHSABS 0.5*  --  0.4* 0.7  MONOABS 1.5*  --  1.4* 1.3*  EOSABS 0.0  --  0.0 0.1  BASOSABS 0.0  --  0.0 0.0    Recent Labs  Lab 08/27/23 1830 08/28/23 0330 08/29/23 0425 08/30/23 0411 08/31/23 0351 09/01/23 1044  NA 141 140  139 138 136 132* 133*  K 5.5* 4.9  4.8 4.2 4.1 4.4 4.5  CL 110 112*  109 110 108 107 105  CO2 17* 17*  16* 18* 19* 18* 17*  ANIONGAP 14 11  14 10 9 7 11   GLUCOSE 115* 91  88 101* 87 89 135*  BUN 84* 81*  82* 82* 84* 88* 99*  CREATININE 5.89* 5.40*  5.38* 5.64* 5.73* 6.20* 6.73*  AST 317* 258*  --   --   --   --   ALT 160* 136*  --   --   --   --   ALKPHOS 60 51  --   --   --   --   BILITOT 1.0 1.1  --   --   --    --   ALBUMIN 3.1* 2.5*  --   --   --   --   INR  --  1.2  --   --   --   --   MG  --   --  1.8 1.7  --   --   PHOS  --   --  3.9 2.8  --   --   CALCIUM  10.5* 9.6  9.4 9.1 8.9 8.8* 9.3      Recent Labs  Lab 08/28/23 0330 08/29/23 0425 08/30/23 0411 08/31/23 0351 09/01/23 1044  INR 1.2  --   --   --   --   MG  --  1.8 1.7  --   --  CALCIUM  9.6  9.4 9.1 8.9 8.8* 9.3    --------------------------------------------------------------------------------------------------------------- Lab Results  Component Value Date   CHOL 206 (H) 06/09/2022   HDL 48 06/09/2022   LDLCALC 141 (H) 06/09/2022   TRIG 84 06/09/2022   CHOLHDL 4.3 06/09/2022    Lab Results  Component Value Date   HGBA1C 5.6 06/08/2022   No results for input(s): "TSH", "T4TOTAL", "FREET4", "T3FREE", "THYROIDAB" in the last 72 hours. No results for input(s): "VITAMINB12", "FOLATE", "FERRITIN", "TIBC", "IRON", "RETICCTPCT" in the last 72 hours. ------------------------------------------------------------------------------------------------------------------ Cardiac Enzymes No results for input(s): "CKMB", "TROPONINI", "MYOGLOBIN" in the last 168 hours.  Invalid input(s): "CK"  Micro Results No results found for this or any previous visit (from the past 240 hours).  Radiology Report No results found.    Signature  -   Seena Dadds M.D on 09/01/2023 at 2:16 PM   -  To page go to [email or web address]

## 2023-09-01 NOTE — TOC Progression Note (Signed)
 Transition of Care [facility]) - Progression Note    Patient Details  Name: LUZELENA HEEG MRN: [id number] Date of Birth: January 24, 1932  [facility] CM/SW Contact  Jannice Mends, LCSW Phone Number: 09/01/2023, 3:33 PM  Clinical Narrative:    CSW received request for Hospice Facility consult. CSW spoke with patient's son and he stated he prefers [facility] in [place]. CSW made him aware that currently they do not have beds available if he would be willing to consider a different location. CSW sent referral to [facility] for review and they will be able to do so tomorrow.    Expected Discharge Plan: Hospice Medical Facility Barriers to Discharge: Hospice Bed not available  Expected Discharge Plan and Services In-house Referral: Clinical Social Work   Post Acute Care Choice: Hospice Living arrangements for the past 2 months: Assisted Living Facility                                       Social Determinants of Health (SDOH) Interventions SDOH Screenings   Food Insecurity: No Food Insecurity (08/28/2023)  Housing: Low Risk  (08/30/2023)  Transportation Needs: No Transportation Needs (08/30/2023)  Utilities: Not At Risk (08/30/2023)  Social Connections: Unknown (08/30/2023)  Tobacco Use: Medium Risk (08/27/2023)    Readmission Risk Interventions     No data to display

## 2023-09-01 NOTE — Progress Notes (Signed)
 Daily Progress Note   Patient Name: Tiffany Rice       Date: 09/01/2023 DOB: 1931/10/21  Age: 89 y.o. MRN#: [id number] Attending Physician: Epifanio Haste, MD Primary Care Physician: Tita Form, MD Admit Date: 08/27/2023  Reason for Consultation/Follow-up: Disposition and Establishing goals of care  HPI/Subjective:  89 y.o. female  with past medical history of CVA on Plavix , CKD stage II, hypothyroidism, essential hypertension, hyperlipidemia and hypothyroidism admitted on 08/27/2023 with fall, generalized weakness, overall decline. From ALF ([facility]). Most notably, she was diagnosed with a subdural hemorrhage (fortunately, felt to be stable at present, holding antiplatelets), AKI superimposed on CKD stage II in the setting of rhabdomyolysis and acute urinary retention (status post Foley insertion), acute on chronic constipation for which she is being managed with a p.o. bowel regimen.  She also had an ischemic stroke back in March 2024 which required a brief skilled nursing facility stay for rehab.  The most pressing issue at present is her AKI.  Unfortunately, despite full medical support her BUN and creatinine are not improving and in fact had a slight worsening when reviewing labs. After GOC meeting on 08/30/2023 she was transition to full comfort care.  Plan is to reassess labs to determine disposition.  Today, patient seen with son [name]) and daughter-in-law Abe Abed) at bedside.  She denies any pain, shortness of breath, or concerning symptoms and states that she feels "fine."  In speaking with her family, she continues to have poor p.o. intake she did eat some cream of wheat today but has been drinking little fluid.  Son and daughter-in-law have been feeding her and she is requiring significant assistance.  Patient had questioned  son [name]) about the plan and next steps which then allowed him to reiterate discuss the plan of discharge to hospice. He states she expressed comfort with this plan. I asked the patient if she was ok with the plan to reassess labs and discharge with comfort care/hospice. She verbalizes understanding and ongoing agreement with current plan and appreciates open and honest discussions as she prefers a more forthcoming approach.  See HPI for ROS.  No new pertinent positive ROS responses unless otherwise listed in HPI.  Length of Stay: 5   Physical Exam Constitutional:      General: She is not in acute distress.    Appearance: She is ill-appearing. She is not toxic-appearing.  HENT:     Mouth/Throat:     Mouth: Mucous membranes are dry.  Pulmonary:     Effort: Pulmonary effort is normal. No respiratory distress.  Skin:    General: Skin is  warm and dry.  Psychiatric:        Mood and Affect: Mood normal.        Behavior: Behavior normal.        Thought Content: Thought content normal.        Judgment: Judgment normal.             Vital Signs: BP (!) 112/51 (BP Location: Left Arm)   Pulse 70   Temp 98.4 F (36.9 C) (Oral)   Resp 17   Ht 5\' 6"  (1.676 m)   Wt 64.8 kg   SpO2 96%   BMI 23.06 kg/m  SpO2: SpO2: 96 % O2 Device: O2 Device: Room Air O2 Flow Rate:        Palliative Assessment/Data: 30-40%   Palliative Care Assessment & Plan    Assessment:  89 year old female with subdural hematoma which fortunately is stable currently in new onset end-stage renal disease.  Not a candidate for dialysis.  GOC comfort. Currently on comfort care.  Comfort needs met at this time.  I did reiterate to patient and her family the importance of monitoring for symptoms and notifying nursing staff right away if symptoms develop.  She agrees to monitor symptoms and keep staff informed.  I also spoke with nursing staff and requested they monitor for pain/shortness of breath and notify PMT if she  develops any symptoms that are not adequately managed with current regimen.  She continue to have poor oral intake. Plan is to transition to hospice.  Awaiting labs to determine dispo.    UPDATE: Follow-up labs resulted CMP reveals progressive decline in renal function with labs from 09/01/2023 demonstrating creatinine 6.73 (08/31/2023: 6.2), BUN 99 (08/31/2023: 88), EGFR 5 (08/31/2023: 6).  Labs and clinical status consistent with progression to end-stage renal disease.  Not a candidate for dialysis.  Prefers comfort care track.  Given progressive and rapid worsening in renal function, her profound decline in function ability, and in anticipation of upcoming symptom management as her renal function continues to decline recommend discharge to inpatient hospice.  Son [name]) updated on labs and plan. Family and patient are in agreement with this plan.  As part of my management with this patient, I reviewed internal medicine notes, TOC notes, nursing notes, labs (most notably independently reviewed her CMP from both 08/31/2023 and 09/01/2023).  I also reviewed vital signs and intake and output.  I coordinated directly with nursing staff, son [name]), Dr. Osborne Blazer, SW, and case management regarding this case.   Recommendations/Plan: Continue Tylenol  PRN pain, low threshold to escalate for pain that does not respond to Tylenol  or for any shortness of breath  Continue comfort care Continue pleasure feeding  Continue current bowel regimen (Senna, Miralax)    Prognosis:  < 2 weeks  Discharge Planning: Recommend GIP Admission (Hospice in Place)  Care plan was discussed with John Jolley (son), Donna Fluharty (daughter), patient, Huey Madrid (palliative NP), and Dr. Osborne Blazer (attending)   Total time: I spent 45 minutes in the care of the patient today in the above activities and documenting the encounter.          Render Carrie, NP  Palliative Medicine Team Team phone # [phone]  Thank you  for allowing the Palliative Medicine Team to assist in the care of this patient. Please utilize secure chat with additional questions, if there is no response within 30 minutes please call the above phone number.  Palliative Medicine Team providers are available by phone from 7am to 7pm  daily and can be reached through the team cell phone.  Should this patient require assistance outside of these hours, please call the patient's attending physician.

## 2023-09-01 NOTE — Plan of Care (Signed)

## 2023-09-01 NOTE — Plan of Care (Signed)
 Pt has rested quietly throughout the night with no distress noted. Alert and oriented. ON room air. Not on tele  monitor. Foley cath intact to BSD amber urine. No complaints voiced. On comfort care.     Problem: Clinical Measurements: Goal: Respiratory complications will improve Outcome: Progressing Goal: Cardiovascular complication will be avoided Outcome: Progressing   Problem: Coping: Goal: Level of anxiety will decrease Outcome: Progressing   Problem: Pain Managment: Goal: General experience of comfort will improve and/or be controlled Outcome: Progressing

## 2023-09-02 DIAGNOSIS — I6200 Nontraumatic subdural hemorrhage, unspecified: Secondary | ICD-10-CM | POA: Diagnosis not present

## 2023-09-02 DIAGNOSIS — R400 Somnolence: Secondary | ICD-10-CM

## 2023-09-02 DIAGNOSIS — Z789 Other specified health status: Secondary | ICD-10-CM

## 2023-09-02 NOTE — Plan of Care (Signed)

## 2023-09-02 NOTE — Progress Notes (Signed)
 Daily Progress Note   Patient Name: Tiffany Rice       Date: 09/02/2023 DOB: 08-12-1931  Age: 89 y.o. MRN#: [id number] Attending Physician: Epifanio Haste, MD Primary Care Physician: Amin, Saad, MD Admit Date: 08/27/2023  Reason for Consultation/Follow-up: Establishing goals of care and end of life monitoring  Subjective:  89 y.o. female  with past medical history of CVA on Plavix , CKD stage II, hypothyroidism, essential hypertension, hyperlipidemia and hypothyroidism admitted on 08/27/2023 with fall, generalized weakness, overall decline. From ALF ([facility]). Most notably, she was diagnosed with a subdural hemorrhage (fortunately, felt to be stable at present, holding antiplatelets), AKI superimposed on CKD stage II in the setting of rhabdomyolysis and acute urinary retention (status post Foley insertion), acute on chronic constipation for which she is being managed with a p.o. bowel regimen.  She also had an ischemic stroke back in March 2024 which required a brief skilled nursing facility stay for rehab.    The most pressing issue at present is her AKI with progression to full renal failure that progressed despite maximal medical therapies. She was transitioned to comfort care on 08/30/2023 with the plan to monitor labs in 24-48 hrs to determine dispo. Follow up labs demonstrated progressive worsening in renal function consistent with renal failure. PO intake has been poor. She is now fully dependent including feeding. PO intake is very poor and she is only taking sips/bites.   Today, patient had a significant and progressive decline.  P.o. intake has further declined only 1-2 bites over the past 24 hours and minimal liquids.  She has also been more drowsy and confused.  History obtained from patient, son [name]), and daughter-in-law  Abe Abed) at bedside.  She denies any pain, shortness of breath, nausea, vomiting, or any other symptoms.  She just states she is tired.  She repeatedly states that she has to urinate and we reassured her numerous times that she had a Foley in place so she can just go (this is new from prior encounters).  Both son and daughter-in-law confirm worsening p.o. intake and decreased arousal.  They also note that she cannot sit upright for long and tends to lean over to 1 side.  They also report she seems more confused and repeats questions.   No new pertinent positive ROS responses other than those listed in HPI.  Discussion: Extensive discussion with patient and family regarding ongoing comfort measures, medication deprescribing, upcoming anticipatory care needs, signs and symptoms to expect as she progresses through end-of-life, and disposition options for hospice care. All of the son and daughter-in-law's questions were answered.  They  were very thankful for the update and ongoing dialogue.  I encouraged them to reach out to the patient's care team including PMT if they had any questions or concerns and they reassure me they have the phone number for PMT.   Length of Stay: 6   Physical Exam Constitutional:      General: She is not in acute distress.    Appearance: She is ill-appearing.  HENT:     Mouth/Throat:     Mouth: Mucous membranes are dry.  Pulmonary:     Effort: Pulmonary effort is normal. No respiratory distress.     Breath sounds: No wheezing.  Abdominal:     General: There is no distension.     Palpations: Abdomen is soft.  Neurological:     Mental Status: She is lethargic.     Comments: Repeat questions, appears more confused and drowsy today, arouses to voice but rapidly falls back to sleep              Vital Signs: BP (!) 116/53 (BP Location: Left Arm)   Pulse 76   Temp 98.6 F (37 C) (Oral)   Resp 16   Ht 5\' 6"  (1.676 m)   Wt 64.8 kg   SpO2 96%   BMI 23.06 kg/m  SpO2:  SpO2: 96 % O2 Device: O2 Device: Room Air O2 Flow Rate:        Palliative Assessment/Data: 20%   Palliative Care Assessment & Plan    Assessment:  89 year old female with new onset end-stage renal disease.  Not a candidate for dialysis.  Remains comfort care only. Comfort needs are met at this time.  Continues to decline/evidence of progressive uremia, nominal PO intake. Hospice liaison evaluated and notes she does not meet criteria at present but plans to follow up 09/03/2023 to reassess.  Recommended inpatient hospice as I anticipate continued rapid and progressive decline in her renal function, worsening uremia, significant decline in her functional status and level of arousal, and in anticipation of upcoming symptom management.  Family open to speaking with several different hospice agencies if appropriate if unable to transition to [facility].    Recommendations/Plan:  DNR/DNI, comfort care only Continue pleasure feeding  Continue Tylenol  PRN pain, low threshold to escalate for pain that does not respond to Tylenol  or for any shortness of breath  Continue current bowel regimen (Senna, Miralax)   Prognosis:  < 2 weeks  Discharge Planning: To Be Determined Recommend inpatient hospice  Care plan was discussed with Dr. Osborne Blazer, Son [name]), daughter in law [place]), SW and En[place] Harry), performed chart review of nursing notes, SW notes, IM physician notes, hospice liaison notes, I/Os, and vital signs.    Total time:  I spent 55 minutes in the care of the patient today in the above activities and documenting the encounter.         Render Carrie, NP  Palliative Medicine Team Team phone # [phone]  Thank you for allowing the Palliative Medicine Team to assist in the care of this patient. Please utilize secure chat with additional questions, if there is no response within 30 minutes please call the above phone number.  Palliative Medicine Team providers are available by  phone from 7am to 7pm daily and can be reached through the team cell phone.  Should this patient require assistance outside of these hours, please call the patient's attending physician.

## 2023-09-02 NOTE — Progress Notes (Signed)
 PROGRESS NOTE                                                                                                                                                                                                             Patient Demographics:    Tiffany Rice, is a 89 y.o. female, DOB - 09/29/1931, JXB:[id number]  Outpatient Primary MD for the patient is Tita Form, MD    LOS - 6  Admit date - 08/27/2023    Chief Complaint  Patient presents with   Fall   Altered Mental Status       Brief Narrative (HPI from H&P)     89 y.o. female with medical history significant of CVA on Plavix , CKD stage II, hypothyroidism, essential hypertension, hyperlipidemia and hypothyroidism presented emergency department complaining for fall and since the fall patient is unable to bear weight.  At baseline patient ambulates.  Patient is also complaining about decreased appetite since the fall and cognitive decline.  [name] at bedside reports progressive weakness, her workup was significant for AKI, rhabdomyolysis, renal and palliative were involved, given her age, frailty, decision has been made for no dialysis, and plan to proceed with full comfort. .    Subjective:   Patient in bed, appears comfortable, denies any complaints this morning, reports good night sleep, she has a poor appetite, reports she is feeling extremely fatigued.     Assessment  & Plan :   Unsteady gait, generalized weakness with multiple falls at assisted living.  Causing subdural hematoma.   - Previous MD discussed with neurosurgery, CT head x 2 stable, no headache or focal deficits, hold antiplatelets for 2 weeks.   Acute kidney injury superimposed on CKD stage II - Rhabdomyolysis - Acute urinary retention , uremia -severe AKI in the setting of mild rhabdo, constipation, dehydration, urinary retention.  - Multifactorial renal insults, which contributed to her dense AKI,  she is very poor candidate for dialysis. - Palliative medicine input greatly appreciated, patient and family did not wish for dialysis on any life-prolonging measures, she is currently on comfort measures. - Labs has been monitored intermittently for prognostic quantification, creatinine continue to trend up, as well BUN continue to trend up.   Hypercalcemia - due to dehydration, hydrated and stabilized   Essential hypertension  - as needed hydralazine   Hypothyroidism  -medication  include levothyroxine  75 mcg daily.     Transaminitis  - Elevated AST/ALT 370/106.  Normal bilirubin and alkaline phosphatase level.  Likely due to rhabdomyolysis monitor hold statin.  CT abdomen pelvis stable from this aspect.   History of CVA  - Holding aspirin  and Plavix  in the setting of subdural hemorrhage.    Chronic constipation  - While in the ED patient already has 1 bowel movement.  Continue MiraLAX and Senokot twice daily.   left ankle pain.   -Strain nonacute, likely soft tissue injury, supportive care, cam boot when bearing weight.  Incidental finding of left ovarian cyst and right middle lobe lung nodule.   - Further follow-up as an outpatient given she is comfort measures  Goals of care discussion: - Palliative medicine input greatly appreciated, currently patient is on full comfort measures, will await further recommendation about disposition recommendation after palliative medicine further discussion with family, with worsening creatinine and uremia, has very poor prognosis and will be a good candidate for residential hospice, awaiting residential hospice evaluation today.        Condition - Extremely Guarded  Family Communication  :  Updated by palliative medicine  Code Status : DNR  Consults  : Neurosurgery  PUD Prophylaxis : PPI   Procedures  :     CT chest abdomen pelvis.  1. Moderate stool ball in the rectum with perirectal fat stranding, suggestive of fecal impaction  developing stercoral colitis. 2. Complex cystic lesion with thin septation in the left ovary measuring 6.6 cm. This measured 4.6 cm on 02/02/2016. Given slow interval growth over 8 years this is likely benign however low-grade cystic neoplasm is not excluded. This could be followed with ultrasound in 6-12 months if clinically appropriate given comorbidities and age. 3. 5 mm nodule in the right middle lobe along the minor fissure. No follow-up needed if patient is low-risk. 4. Moderate hiatal hernia. 5. Aortic Atherosclerosis  CT head.  Trace subdural hemorrhage along the posterior falx measuring 4 mm in thickness (series 3/image 14). No mass effect or midline shift. No evidence of acute infarct. No hydrocephalus. No extra-axial fluid collection. Age-commensurate cerebral atrophy and chronic small vessel ischemic disease. Vascular: No hyperdense vessel. Intracranial arterial calcification. 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   CT C-spine. 1. No acute fracture in the cervical spine. 2. Frothy debris in the esophagus reaching above the thoracic inlet, which is an aspiration risk. Critical Value/emergent results were called by telephone at the time of interpretation on 08/27/2023 at 7:22 pm to provider MADISON [facility] , who verbally acknowledged these results   Renal ultrasound.  Nonacute.      Disposition Plan  :    Status is: Inpatient   DVT Prophylaxis  :    SCDs Start: 08/27/23 2129    Lab Results  Component Value Date   PLT 120 (L) 08/30/2023    Diet :  Diet Order             Diet regular Room service appropriate? Yes; Fluid consistency: Thin  Diet effective now                    Inpatient Medications  Scheduled Meds:  Chlorhexidine  Gluconate Cloth  6 each Topical Daily   docusate sodium   200 mg Oral BID   pantoprazole  40 mg Oral Daily   polyethylene glycol  17 g Oral BID   senna-docusate  1 tablet Oral BID   Continuous Infusions:   PRN  Meds:.acetaminophen ,  hydrALAZINE, [DISCONTINUED] ondansetron **OR** ondansetron (ZOFRAN) IV, mouth rinse  Antibiotics  :    Anti-infectives (From admission, onward)    None         Objective:   Vitals:   08/31/23 2036 08/31/23 2318 09/01/23 1237 09/01/23 1945  BP: (!) 114/57 (!) 112/51 (!) 107/53 (!) 116/53  Pulse: 70  68 76  Resp: 17   16  Temp: 98.7 F (37.1 C) 98.4 F (36.9 C)  98.6 F (37 C)  TempSrc: Oral Oral  Oral  SpO2: 96%   96%  Weight:      Height:        Wt Readings from Last 3 Encounters:  08/28/23 64.8 kg  07/20/22 75.1 kg  06/08/22 76.7 kg    No intake or output data in the 24 hours ending 09/02/23 1333    Physical Exam  Awake, Frail, deconditioned, good air entry, abdomen soft, extremities with no edema        Data Review:    Recent Labs  Lab 08/27/23 1830 08/28/23 0330 08/29/23 0425 08/30/23 0411  WBC 12.8* 10.7* 12.0* 10.5  HGB 13.5 11.6* 11.3* 10.5*  HCT 40.3 34.6* 33.3* 31.0*  PLT 148* 135* 130* 120*  MCV 91.0 91.3 89.8 88.8  MCH 30.5 30.6 30.5 30.1  MCHC 33.5 33.5 33.9 33.9  RDW 13.8 14.0 13.8 13.9  LYMPHSABS 0.5*  --  0.4* 0.7  MONOABS 1.5*  --  1.4* 1.3*  EOSABS 0.0  --  0.0 0.1  BASOSABS 0.0  --  0.0 0.0    Recent Labs  Lab 08/27/23 1830 08/28/23 0330 08/29/23 0425 08/30/23 0411 08/31/23 0351 09/01/23 1044  NA 141 140  139 138 136 132* 133*  K 5.5* 4.9  4.8 4.2 4.1 4.4 4.5  CL 110 112*  109 110 108 107 105  CO2 17* 17*  16* 18* 19* 18* 17*  ANIONGAP 14 11  14 10 9 7 11   GLUCOSE 115* 91  88 101* 87 89 135*  BUN 84* 81*  82* 82* 84* 88* 99*  CREATININE 5.89* 5.40*  5.38* 5.64* 5.73* 6.20* 6.73*  AST 317* 258*  --   --   --   --   ALT 160* 136*  --   --   --   --   ALKPHOS 60 51  --   --   --   --   BILITOT 1.0 1.1  --   --   --   --   ALBUMIN 3.1* 2.5*  --   --   --   --   INR  --  1.2  --   --   --   --   MG  --   --  1.8 1.7  --   --   PHOS  --   --  3.9 2.8  --   --   CALCIUM  10.5* 9.6  9.4 9.1 8.9 8.8* 9.3       Recent Labs  Lab 08/28/23 0330 08/29/23 0425 08/30/23 0411 08/31/23 0351 09/01/23 1044  INR 1.2  --   --   --   --   MG  --  1.8 1.7  --   --   CALCIUM  9.6  9.4 9.1 8.9 8.8* 9.3    --------------------------------------------------------------------------------------------------------------- Lab Results  Component Value Date   CHOL 206 (H) 06/09/2022   HDL 48 06/09/2022   LDLCALC 141 (H) 06/09/2022   TRIG 84 06/09/2022   CHOLHDL 4.3 06/09/2022  Lab Results  Component Value Date   HGBA1C 5.6 06/08/2022   No results for input(s): "TSH", "T4TOTAL", "FREET4", "T3FREE", "THYROIDAB" in the last 72 hours. No results for input(s): "VITAMINB12", "FOLATE", "FERRITIN", "TIBC", "IRON", "RETICCTPCT" in the last 72 hours. ------------------------------------------------------------------------------------------------------------------ Cardiac Enzymes No results for input(s): "CKMB", "TROPONINI", "MYOGLOBIN" in the last 168 hours.  Invalid input(s): "CK"  Micro Results No results found for this or any previous visit (from the past 240 hours).  Radiology Report No results found.    Signature  -   Seena Dadds M.D on 09/02/2023 at 1:33 PM   -  To page go to [email or web address]

## 2023-09-02 NOTE — Plan of Care (Signed)
 Pt has rested quietly throughout the night with no distress noted. Alert and oriented. ON room air. Pt is not on tele monitor. Foley cath intact to BSD. On comfort care. No complaints voiced.     Problem: Clinical Measurements: Goal: Respiratory complications will improve Outcome: Progressing Goal: Cardiovascular complication will be avoided Outcome: Progressing   Problem: Coping: Goal: Level of anxiety will decrease Outcome: Progressing   Problem: Pain Managment: Goal: General experience of comfort will improve and/or be controlled Outcome: Progressing

## 2023-09-02 NOTE — Progress Notes (Signed)
 [facility] [phone] [facility]  Hospice hospital liaison note   Received request from [facility] for family interest in hospice inpatient unit.    Chart reviewed and visit made. At this time patient does not meet criteria for hospice inpatient unit.    She is appropriate for hospice services in the home or LTC facility and we will follow to assess for decline and inpatient hospice appropriateness.    Thank you for the opportunity to participate in this patient's care.    Ardine Beckwith, LPN Hospice hospital liaison [phone]

## 2023-09-02 NOTE — TOC Progression Note (Signed)
 Transition of Care [facility]) - Progression Note    Patient Details  Name: Tiffany Rice MRN: [id number] Date of Birth: Mar 13, 1932  Transition of Care [facility]) CM/SW Contact  Jannice Mends, LCSW Phone Number: 09/02/2023, 2:13 PM  Clinical Narrative:    CSW spoke with patient's son regarding [facility] not being able to accept patient and if he would be willing to consider an evaluation by [facility] [place]. Son requested to wait one more day to evaluate patient's decline before consenting to [facility] Evaluation. Team updated and [facility] to assess tomorrow.    Expected Discharge Plan: Hospice Medical Facility Barriers to Discharge: Hospice Bed not available  Expected Discharge Plan and Services In-house Referral: Clinical Social Work   Post Acute Care Choice: Hospice Living arrangements for the past 2 months: Assisted Living Facility                                       Social Determinants of Health (SDOH) Interventions SDOH Screenings   Food Insecurity: No Food Insecurity (08/28/2023)  Housing: Low Risk  (08/30/2023)  Transportation Needs: No Transportation Needs (08/30/2023)  Utilities: Not At Risk (08/30/2023)  Social Connections: Unknown (08/30/2023)  Tobacco Use: Medium Risk (08/27/2023)    Readmission Risk Interventions     No data to display

## 2023-09-03 DIAGNOSIS — I6200 Nontraumatic subdural hemorrhage, unspecified: Secondary | ICD-10-CM | POA: Diagnosis not present

## 2023-09-03 MED ORDER — ENSURE PLUS HIGH PROTEIN PO LIQD
237.0000 mL | Freq: Two times a day (BID) | ORAL | Status: DC
  Administered 2023-09-04 – 2023-09-05 (×4): 237 mL via ORAL

## 2023-09-03 NOTE — Progress Notes (Signed)
 PROGRESS NOTE                                                                                                                                                                                                             Patient Demographics:    Tiffany Rice, is a 89 y.o. female, DOB - Sep 06, 1931, [id number]  Outpatient Primary MD for the patient is Tita Form, MD    LOS - 7  Admit date - 08/27/2023    Chief Complaint  Patient presents with   Fall   Altered Mental Status       Brief Narrative (HPI from H&P)     89 y.o. female with medical history significant of CVA on Plavix , CKD stage II, hypothyroidism, essential hypertension, hyperlipidemia and hypothyroidism presented emergency department complaining for fall and since the fall patient is unable to bear weight.  At baseline patient ambulates.  Patient is also complaining about decreased appetite since the fall and cognitive decline.  [name] at bedside reports progressive weakness, her workup was significant for AKI, rhabdomyolysis, renal and palliative were involved, given her age, frailty, decision has been made for no dialysis, and plan to proceed with full comfort. .    Subjective:   Patient in bed, appears comfortable, no new complaints, as discussed with staff she is extremely fatigued, nonambulatory.      Assessment  & Plan :   Unsteady gait, generalized weakness with multiple falls at assisted living.  Causing subdural hematoma.   - Previous MD discussed with neurosurgery, CT head x 2 stable, no headache or focal deficits, hold antiplatelets for 2 weeks.   Acute kidney injury superimposed on CKD stage II - Rhabdomyolysis - Acute urinary retention , uremia -severe AKI in the setting of mild rhabdo, constipation, dehydration, urinary retention.  - Multifactorial renal insults, which contributed to her dense AKI, she is very poor candidate for dialysis. -  Palliative medicine input greatly appreciated, patient and family did not wish for dialysis on any life-prolonging measures, she is currently on comfort measures. - Labs has been monitored intermittently for prognostic quantification, creatinine continue to trend up, as well BUN continue to trend up.   Hypercalcemia - due to dehydration, hydrated and stabilized   Essential hypertension  - as needed hydralazine    Hypothyroidism  -medication include levothyroxine  75 mcg daily.  Transaminitis  - Elevated AST/ALT 370/106.  Normal bilirubin and alkaline phosphatase level.  Likely due to rhabdomyolysis monitor hold statin.  CT abdomen pelvis stable from this aspect.   History of CVA  - Holding aspirin  and Plavix  in the setting of subdural hemorrhage.    Chronic constipation  - While in the ED patient already has 1 bowel movement.  Continue MiraLAX  and Senokot twice daily.   left ankle pain.   -Strain nonacute, likely soft tissue injury, supportive care, cam boot when bearing weight.  Incidental finding of left ovarian cyst and right middle lobe lung nodule.   - Further follow-up as an outpatient given she is comfort measures  Goals of care discussion: - Palliative medicine input greatly appreciated, currently patient is on full comfort measures, will await further recommendation about disposition recommendation after palliative medicine further discussion with family, with worsening creatinine and uremia, has very poor prognosis and will be a good candidate for residential hospice.       Condition - E conferred care  Family Communication  :  Updated by palliative medicine  Code Status : DNR  Consults  : Neurosurgery  PUD Prophylaxis : PPI   Procedures  :     CT chest abdomen pelvis.  1. Moderate stool ball in the rectum with perirectal fat stranding, suggestive of fecal impaction developing stercoral colitis. 2. Complex cystic lesion with thin septation in the left ovary measuring  6.6 cm. This measured 4.6 cm on 02/02/2016. Given slow interval growth over 8 years this is likely benign however low-grade cystic neoplasm is not excluded. This could be followed with ultrasound in 6-12 months if clinically appropriate given comorbidities and age. 3. 5 mm nodule in the right middle lobe along the minor fissure. No follow-up needed if patient is low-risk. 4. Moderate hiatal hernia. 5. Aortic Atherosclerosis  CT head.  Trace subdural hemorrhage along the posterior falx measuring 4 mm in thickness (series 3/image 14). No mass effect or midline shift. No evidence of acute infarct. No hydrocephalus. No extra-axial fluid collection. Age-commensurate cerebral atrophy and chronic small vessel ischemic disease. Vascular: No hyperdense vessel. Intracranial arterial calcification. 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   CT C-spine. 1. No acute fracture in the cervical spine. 2. Frothy debris in the esophagus reaching above the thoracic inlet, which is an aspiration risk. Critical Value/emergent results were called by telephone at the time of interpretation on 08/27/2023 at 7:22 pm to provider MADISON [facility] , who verbally acknowledged these results   Renal ultrasound.  Nonacute.      Disposition Plan  : She will need residential hospice as she is currently total care  Status is: Inpatient   DVT Prophylaxis  :    SCDs Start: 08/27/23 2129    Lab Results  Component Value Date   PLT 120 (L) 08/30/2023    Diet :  Diet Order             Diet regular Room service appropriate? Yes; Fluid consistency: Thin  Diet effective now                    Inpatient Medications  Scheduled Meds:  Chlorhexidine  Gluconate Cloth  6 each Topical Daily   docusate sodium   200 mg Oral BID   feeding supplement  237 mL Oral BID BM   pantoprazole   40 mg Oral Daily   polyethylene glycol  17 g Oral BID   senna-docusate  1 tablet Oral BID  Continuous Infusions:   PRN  Meds:.acetaminophen , hydrALAZINE , [DISCONTINUED] ondansetron  **OR** ondansetron  (ZOFRAN ) IV, mouth rinse  Antibiotics  :    Anti-infectives (From admission, onward)    None         Objective:   Vitals:   09/01/23 1237 09/01/23 1945 09/02/23 2007 09/03/23 0800  BP: (!) 107/53 (!) 116/53 (!) 139/38 120/61  Pulse: 68 76 (!) 55 72  Resp:  16 20 18   Temp:  98.6 F (37 C) 97.6 F (36.4 C) 97.6 F (36.4 C)  TempSrc:  Oral Oral Oral  SpO2:  96% 94% 96%  Weight:      Height:        Wt Readings from Last 3 Encounters:  08/28/23 64.8 kg  07/20/22 75.1 kg  06/08/22 76.7 kg     Intake/Output Summary (Last 24 hours) at 09/03/2023 1337 Last data filed at 09/03/2023 1011 Gross per 24 hour  Intake 120 ml  Output --  Net 120 ml      Physical Exam  Awake, frail, deconditioned , fair entry bilaterally, abdomen soft, extremities with no edema .        Data Review:    Recent Labs  Lab 08/27/23 1830 08/28/23 0330 08/29/23 0425 08/30/23 0411  WBC 12.8* 10.7* 12.0* 10.5  HGB 13.5 11.6* 11.3* 10.5*  HCT 40.3 34.6* 33.3* 31.0*  PLT 148* 135* 130* 120*  MCV 91.0 91.3 89.8 88.8  MCH 30.5 30.6 30.5 30.1  MCHC 33.5 33.5 33.9 33.9  RDW 13.8 14.0 13.8 13.9  LYMPHSABS 0.5*  --  0.4* 0.7  MONOABS 1.5*  --  1.4* 1.3*  EOSABS 0.0  --  0.0 0.1  BASOSABS 0.0  --  0.0 0.0    Recent Labs  Lab 08/27/23 1830 08/28/23 0330 08/29/23 0425 08/30/23 0411 08/31/23 0351 09/01/23 1044  NA 141 140  139 138 136 132* 133*  K 5.5* 4.9  4.8 4.2 4.1 4.4 4.5  CL 110 112*  109 110 108 107 105  CO2 17* 17*  16* 18* 19* 18* 17*  ANIONGAP 14 11  14 10 9 7 11   GLUCOSE 115* 91  88 101* 87 89 135*  BUN 84* 81*  82* 82* 84* 88* 99*  CREATININE 5.89* 5.40*  5.38* 5.64* 5.73* 6.20* 6.73*  AST 317* 258*  --   --   --   --   ALT 160* 136*  --   --   --   --   ALKPHOS 60 51  --   --   --   --   BILITOT 1.0 1.1  --   --   --   --   ALBUMIN 3.1* 2.5*  --   --   --   --   INR  --  1.2  --    --   --   --   MG  --   --  1.8 1.7  --   --   PHOS  --   --  3.9 2.8  --   --   CALCIUM  10.5* 9.6  9.4 9.1 8.9 8.8* 9.3      Recent Labs  Lab 08/28/23 0330 08/29/23 0425 08/30/23 0411 08/31/23 0351 09/01/23 1044  INR 1.2  --   --   --   --   MG  --  1.8 1.7  --   --   CALCIUM  9.6  9.4 9.1 8.9 8.8* 9.3    --------------------------------------------------------------------------------------------------------------- Lab Results  Component Value Date  CHOL 206 (H) 06/09/2022   HDL 48 06/09/2022   LDLCALC 141 (H) 06/09/2022   TRIG 84 06/09/2022   CHOLHDL 4.3 06/09/2022    Lab Results  Component Value Date   HGBA1C 5.6 06/08/2022   No results for input(s): "TSH", "T4TOTAL", "FREET4", "T3FREE", "THYROIDAB" in the last 72 hours. No results for input(s): "VITAMINB12", "FOLATE", "FERRITIN", "TIBC", "IRON", "RETICCTPCT" in the last 72 hours. ------------------------------------------------------------------------------------------------------------------ Cardiac Enzymes No results for input(s): "CKMB", "TROPONINI", "MYOGLOBIN" in the last 168 hours.  Invalid input(s): "CK"  Micro Results No results found for this or any previous visit (from the past 240 hours).  Radiology Report No results found.    Signature  -   Seena Dadds M.D on 09/03/2023 at 1:37 PM   -  To page go to [email or web address]

## 2023-09-03 NOTE — Progress Notes (Signed)
 [facility] [phone] [facility]  Hospice hospital liaison note   Following for request from [facility] for family interest in hospice inpatient unit.    Chart reviewed and visit made. At this time patient does not meet criteria for hospice inpatient unit.    She is appropriate for hospice services in the home or LTC facility and we will follow to assess for decline and inpatient hospice appropriateness.    Thank you for the opportunity to participate in this patient's care.    Ardine Beckwith, LPN Hospice hospital liaison [phone]

## 2023-09-03 NOTE — Plan of Care (Signed)
  Problem: Clinical Measurements: Goal: Ability to maintain clinical measurements within normal limits will improve Outcome: Progressing Goal: Will remain free from infection Outcome: Progressing   Problem: Nutrition: Goal: Adequate nutrition will be maintained Outcome: Progressing   Problem: Skin Integrity: Goal: Risk for impaired skin integrity will decrease Outcome: Progressing

## 2023-09-04 DIAGNOSIS — I6200 Nontraumatic subdural hemorrhage, unspecified: Secondary | ICD-10-CM | POA: Diagnosis not present

## 2023-09-04 MED ORDER — LORAZEPAM 2 MG/ML IJ SOLN: 0.5000 mg | INTRAMUSCULAR | Status: DC | PRN

## 2023-09-04 MED ORDER — HYDROMORPHONE HCL 1 MG/ML IJ SOLN: 0.5000 mg | INTRAMUSCULAR | Status: DC | PRN

## 2023-09-04 NOTE — Plan of Care (Signed)

## 2023-09-04 NOTE — Plan of Care (Signed)
  Problem: Clinical Measurements: Goal: Ability to maintain clinical measurements within normal limits will improve Outcome: Progressing   Problem: Coping: Goal: Level of anxiety will decrease Outcome: Progressing   Problem: Safety: Goal: Ability to remain free from injury will improve Outcome: Progressing   

## 2023-09-04 NOTE — TOC Progression Note (Signed)
 Transition of Care [facility]) - Progression Note    Patient Details  Name: Tiffany Rice MRN: [id number] Date of Birth: [birth year]  Transition of Care [facility]) CM/SW Contact  Claudean Crumbly, LCSWA Phone Number: 09/04/2023, 9:19 AM  Clinical Narrative:     SW spoke with [name] [phone]) informed [facility] does not think she meets criteria. John agreeable to referral to [facility] prefer [employer] location.   SW spoke with [facility] [place] [phone]) reports no beds today but will start referral process and reach out to pt's son.     Expected Discharge Plan: Hospice Medical Facility Barriers to Discharge: Hospice Bed not available  Expected Discharge Plan and Services In-house Referral: Clinical Social Work   Post Acute Care Choice: Hospice Living arrangements for the past 2 months: Assisted Living Facility                                       Social Determinants of Health (SDOH) Interventions SDOH Screenings   Food Insecurity: No Food Insecurity (08/28/2023)  Housing: Low Risk  (08/30/2023)  Transportation Needs: No Transportation Needs (08/30/2023)  Utilities: Not At Risk (08/30/2023)  Social Connections: Unknown (08/30/2023)  Tobacco Use: Medium Risk (08/27/2023)    Readmission Risk Interventions     No data to display

## 2023-09-04 NOTE — Progress Notes (Signed)
 PROGRESS NOTE                                                                                                                                                                                                             Patient Demographics:    Tiffany Rice, is a 89 y.o. female, DOB - Mar 25, 1932, [id number]  Outpatient Primary MD for the patient is Tiffany Form, MD    LOS - 8  Admit date - 08/27/2023    Chief Complaint  Patient presents with   Fall   Altered Mental Status       Brief Narrative (HPI from H&P)     89 y.o. female with medical history significant of CVA on Plavix , CKD stage II, hypothyroidism, essential hypertension, hyperlipidemia and hypothyroidism presented emergency department complaining for fall and since the fall patient is unable to bear weight.  At baseline patient ambulates.  Patient is also complaining about decreased appetite since the fall and cognitive decline.  Tiffany Rice at bedside reports progressive weakness, her workup was significant for AKI, rhabdomyolysis, renal and palliative were involved, given her age, frailty, decision has been made for no dialysis, and plan to proceed with full comfort. .    Subjective:   Patient in bed, very poor oral intake yesterday, but she appears comfortable   Assessment  & Plan :   Unsteady gait, generalized weakness with multiple falls at assisted living.  Causing subdural hematoma.   - Previous MD discussed with neurosurgery, CT head x 2 stable, no headache or focal deficits, hold antiplatelets for 2 weeks.   Acute kidney injury superimposed on CKD stage II - Rhabdomyolysis - Acute urinary retention , uremia -severe AKI in the setting of mild rhabdo, constipation, dehydration, urinary retention.  - Multifactorial renal insults, which contributed to her dense AKI, she is very poor candidate for dialysis. - Palliative medicine input greatly appreciated,  patient and family did not wish for dialysis on any life-prolonging measures, she is currently on comfort measures. - Labs has been monitored intermittently for prognostic quantification, creatinine continue to trend up, as well BUN continue to trend up.   Hypercalcemia - due to dehydration, hydrated and stabilized   Essential hypertension  - as needed hydralazine    Hypothyroidism  -medication include levothyroxine  75 mcg daily.     Transaminitis  - Elevated AST/ALT 370/106.  Normal bilirubin and alkaline phosphatase level.  Likely due to rhabdomyolysis monitor hold statin.  CT abdomen pelvis stable from this aspect.   History of CVA  - Holding aspirin  and Plavix  in the setting of subdural hemorrhage.    Chronic constipation  - While in the ED patient already has 1 bowel movement.  Continue MiraLAX  and Senokot twice daily.   left ankle pain.   -Strain nonacute, likely soft tissue injury, supportive care, cam boot when bearing weight.  Incidental finding of left ovarian cyst and right middle lobe lung nodule.   - Further follow-up as an outpatient given she is comfort measures  Goals of care discussion: - Palliative medicine input greatly appreciated, currently patient is on full comfort measures, will await further recommendation about disposition recommendation after palliative medicine further discussion with family, with worsening creatinine and uremia, has very poor prognosis, with very minimal oral intake, significant decline, anticipated death in few days, referral has been made to residential hospice in [facility] today.  Discussed with son .      Condition - E conferred care  Family Communication  :  Updated by palliative medicine, discussed with son at bedside  Code Status : DNR  Consults  : Neurosurgery  PUD Prophylaxis : PPI   Procedures  :     CT chest abdomen pelvis.  1. Moderate stool ball in the rectum with perirectal fat stranding, suggestive of fecal impaction  developing stercoral colitis. 2. Complex cystic lesion with thin septation in the left ovary measuring 6.6 cm. This measured 4.6 cm on 02/02/2016. Given slow interval growth over 8 years this is likely benign however low-grade cystic neoplasm is not excluded. This could be followed with ultrasound in 6-12 months if clinically appropriate given comorbidities and age. 3. 5 mm nodule in the right middle lobe along the minor fissure. No follow-up needed if patient is low-risk. 4. Moderate hiatal hernia. 5. Aortic Atherosclerosis  CT head.  Trace subdural hemorrhage along the posterior falx measuring 4 mm in thickness (series 3/image 14). No mass effect or midline shift. No evidence of acute infarct. No hydrocephalus. No extra-axial fluid collection. Age-commensurate cerebral atrophy and chronic small vessel ischemic disease. Vascular: No hyperdense vessel. Intracranial arterial calcification. 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   CT C-spine. 1. No acute fracture in the cervical spine. 2. Frothy debris in the esophagus reaching above the thoracic inlet, which is an aspiration risk. Critical Value/emergent results were called by telephone at the time of interpretation on 08/27/2023 at 7:22 pm to provider MADISON [facility] , who verbally acknowledged these results   Renal ultrasound.  Nonacute.      Disposition Plan  : She will need residential hospice as she is currently total care  Status is: Inpatient   DVT Prophylaxis  :    SCDs Start: 08/27/23 2129    Lab Results  Component Value Date   PLT 120 (L) 08/30/2023    Diet :  Diet Order             Diet regular Room service appropriate? Yes; Fluid consistency: Thin  Diet effective now                    Inpatient Medications  Scheduled Meds:  Chlorhexidine  Gluconate Cloth  6 each Topical Daily   docusate sodium   200 mg Oral BID   feeding supplement  237 mL Oral BID BM   pantoprazole   40 mg Oral Daily   polyethylene  glycol   17 g Oral BID   senna-docusate  1 tablet Oral BID   Continuous Infusions:   PRN Meds:.acetaminophen , hydrALAZINE , HYDROmorphone (DILAUDID) injection, LORazepam , [DISCONTINUED] ondansetron  **OR** ondansetron  (ZOFRAN ) IV, mouth rinse  Antibiotics  :    Anti-infectives (From admission, onward)    None         Objective:   Vitals:   09/02/23 2007 09/03/23 0800 09/03/23 1957 09/04/23 0748  BP: (!) 139/38 120/61 (!) 98/46 (!) 126/51  Pulse: (!) 55 72 72 67  Resp: 20 18 16 18   Temp: 97.6 F (36.4 C) 97.6 F (36.4 C) 97.9 F (36.6 C) 98.2 F (36.8 C)  TempSrc: Oral Oral Oral Oral  SpO2: 94% 96%  97%  Weight:      Height:        Wt Readings from Last 3 Encounters:  08/28/23 64.8 kg  07/20/22 75.1 kg  06/08/22 76.7 kg     Intake/Output Summary (Last 24 hours) at 09/04/2023 1258 Last data filed at 09/04/2023 0555 Gross per 24 hour  Intake --  Output 900 ml  Net -900 ml      Physical Exam  Awake, frail, deconditioned , fair entry bilaterally, abdomen soft, extremities with no edema .        Data Review:    Recent Labs  Lab 08/29/23 0425 08/30/23 0411  WBC 12.0* 10.5  HGB 11.3* 10.5*  HCT 33.3* 31.0*  PLT 130* 120*  MCV 89.8 88.8  MCH 30.5 30.1  MCHC 33.9 33.9  RDW 13.8 13.9  LYMPHSABS 0.4* 0.7  MONOABS 1.4* 1.3*  EOSABS 0.0 0.1  BASOSABS 0.0 0.0    Recent Labs  Lab 08/29/23 0425 08/30/23 0411 08/31/23 0351 09/01/23 1044  NA 138 136 132* 133*  K 4.2 4.1 4.4 4.5  CL 110 108 107 105  CO2 18* 19* 18* 17*  ANIONGAP 10 9 7 11   GLUCOSE 101* 87 89 135*  BUN 82* 84* 88* 99*  CREATININE 5.64* 5.73* 6.20* 6.73*  MG 1.8 1.7  --   --   PHOS 3.9 2.8  --   --   CALCIUM  9.1 8.9 8.8* 9.3      Recent Labs  Lab 08/29/23 0425 08/30/23 0411 08/31/23 0351 09/01/23 1044  MG 1.8 1.7  --   --   CALCIUM  9.1 8.9 8.8* 9.3    --------------------------------------------------------------------------------------------------------------- Lab Results   Component Value Date   CHOL 206 (H) 06/09/2022   HDL 48 06/09/2022   LDLCALC 141 (H) 06/09/2022   TRIG 84 06/09/2022   CHOLHDL 4.3 06/09/2022    Lab Results  Component Value Date   HGBA1C 5.6 06/08/2022   No results for input(s): "TSH", "T4TOTAL", "FREET4", "T3FREE", "THYROIDAB" in the last 72 hours. No results for input(s): "VITAMINB12", "FOLATE", "FERRITIN", "TIBC", "IRON", "RETICCTPCT" in the last 72 hours. ------------------------------------------------------------------------------------------------------------------ Cardiac Enzymes No results for input(s): "CKMB", "TROPONINI", "MYOGLOBIN" in the last 168 hours.  Invalid input(s): "CK"  Micro Results No results found for this or any previous visit (from the past 240 hours).  Radiology Report No results found.    Signature  -   Seena Dadds M.D on 09/04/2023 at 12:58 PM   -  To page go to [email or web address]

## 2023-09-04 NOTE — Progress Notes (Signed)
 [facility] [phone] [facility] Hospice hospital liaison note   Following for inpatient hospice consideration.   Chart reviewed and patient visited with son at bedside. At this time patient does not meet criteria for hospice inpatient unit.    She is appropriate for hospice services in the home or LTC facility and we will follow to assess for decline and inpatient hospice appropriateness.    Thank you for the opportunity to participate in this patient's care.    Madelene Schanz, BSN, RN, [facility] hospital liaison [phone]

## 2023-09-05 DIAGNOSIS — I6200 Nontraumatic subdural hemorrhage, unspecified: Secondary | ICD-10-CM | POA: Diagnosis not present

## 2023-09-05 MED ORDER — OXYCODONE HCL 5 MG/5ML PO SOLN: 5.0000 mg | Freq: Four times a day (QID) | ORAL | Status: AC | PRN

## 2023-09-05 MED ORDER — LORAZEPAM 1 MG PO TABS: 1.0000 mg | ORAL_TABLET | ORAL | Status: AC | PRN

## 2023-09-05 MED ORDER — PANTOPRAZOLE SODIUM 40 MG PO TBEC: 40.0000 mg | DELAYED_RELEASE_TABLET | Freq: Every day | ORAL | Status: AC

## 2023-09-05 NOTE — Discharge Summary (Signed)
 Physician Discharge Summary  JET TRAYNHAM ZOX:[id number] DOB: 1931-10-17 DOA: 08/27/2023  PCP: Amin, Saad, MD  Admit date: 08/27/2023 Discharge date: 09/05/2023  Admitted From: (Home) Disposition:  ([facility])     CODE STATUS: (DNR, Comfort Care) Diet recommendation:  Regular   Brief/Interim Summary: 89 y.o. female with medical history significant of CVA on Plavix , CKD stage II, hypothyroidism, essential hypertension, hyperlipidemia and hypothyroidism presented emergency department complaining for fall and since the fall patient is unable to bear weight.  At baseline patient ambulates.  Patient is also complaining about decreased appetite since the fall and cognitive decline.  [name] at bedside reports progressive weakness, her workup was significant for AKI, rhabdomyolysis, renal and palliative were involved, given her age, frailty, decision has been made for no dialysis, and plan to proceed with full comfort. .    Goals of care discussion: End-of-life care - Palliative medicine input greatly appreciated, currently patient is on full comfort measures, will await further recommendation about disposition recommendation after palliative medicine further discussion with family, with worsening creatinine and uremia, has very poor prognosis, with very minimal oral intake, significant decline, anticipated death in few days, referral has been made to residential hospice in [facility], and patient has been accepted for today as bed is available, I have discussed with the son and daughter at bedside, agreeable to plan.   Acute kidney injury superimposed on CKD stage II - Rhabdomyolysis - Acute urinary retention , uremia -severe AKI in the setting of mild rhabdo, constipation, dehydration, urinary retention.  - Multifactorial renal insults, which contributed to her dense AKI, she is very poor candidate for dialysis. - Palliative medicine input greatly appreciated, patient and  family did not wish for dialysis on any life-prolonging measures, she is currently on comfort measures. - Labs has been monitored intermittently for prognostic quantification, creatinine continue to trend up, as well BUN continue to trend up.  Unsteady gait, generalized weakness with multiple falls at assisted living.  Causing subdural hematoma.   - Previous MD discussed with neurosurgery, CT head x 2 stable   Hypercalcemia - due to dehydration, hydrated and stabilized  Essential hypertension - comfort   Hypothyroidism  -comfort   Transaminitis  - Elevated AST/ALT 370/106.  Normal bilirubin and alkaline phosphatase level.  Likely due to rhabdomyolysis monitor hold statin.  CT abdomen pelvis stable from this aspect.   History of CVA     Chronic constipation  - While in the ED patient already has 1 bowel movement.  Continue MiraLAX  and Senokot twice daily.    left ankle pain.   -Strain nonacute, likely soft tissue injury, supportive care, cam boot when bearing weight.   Incidental finding of left ovarian cyst and right middle lobe lung nodule.   - Further follow-up as an outpatient given she is comfort measures      Discharge Diagnoses:  Principal Problem:   Subdural hemorrhage (HCC) Active Problems:   AKI (acute kidney injury) (HCC)   Rhabdomyolysis   Hypercalcemia   Acute urinary retention   Essential hypertension   Hyperlipidemia   Hypothyroidism   Recurrent falls   Hyperkalemia   Transaminitis   History of CVA (cerebrovascular accident)   Palliative care by specialist   End of life care   DNR (do not resuscitate)   Acute kidney injury superimposed on chronic kidney disease [facility])    Discharge Instructions  Discharge Instructions     Diet - low sodium heart healthy   Complete by: As  directed    Discharge instructions   Complete by: As directed    Management per residential hospice   Increase activity slowly   Complete by: As directed       Allergies as of  09/05/2023       Reactions   Bactrim [sulfamethoxazole-trimethoprim] Other (See Comments)   Mood swings  **Not listed on the Physician's Orders"*        Medication List     STOP taking these medications    Aspirin  Low Dose 81 MG tablet Generic drug: aspirin  EC   carvedilol  6.25 MG tablet Commonly known as: COREG    levothyroxine  75 MCG tablet Commonly known as: SYNTHROID    olmesartan 40 MG tablet Commonly known as: BENICAR   potassium chloride 10 MEQ tablet Commonly known as: KLOR-CON   rosuvastatin  20 MG tablet Commonly known as: CRESTOR    Vitamin D-3 25 MCG (1000 UT) Caps       TAKE these medications    LORazepam  1 MG tablet Commonly known as: Ativan  Take 1 tablet (1 mg total) by mouth every 4 (four) hours as needed for anxiety or sedation.   oxyCODONE 5 MG/5ML solution Commonly known as: ROXICODONE Take 5 mLs (5 mg total) by mouth every 6 (six) hours as needed for severe pain (pain score 7-10).   pantoprazole  40 MG tablet Commonly known as: Protonix  Take 1 tablet (40 mg total) by mouth daily.        Allergies  Allergen Reactions   Bactrim [Sulfamethoxazole-Trimethoprim] Other (See Comments)    Mood swings  **Not listed on the Physician's Orders"*     Consultations: Palliaitve renal   Procedures/Studies: DG Ankle Complete Left Result Date: 08/29/2023 CLINICAL DATA:  [id number] Left ankle pain [id number] EXAM: LEFT ANKLE COMPLETE - 3 VIEW COMPARISON:  None Available. FINDINGS: There is no evidence of fracture, dislocation, or subluxation. There is no evidence of arthropathy or other focal bone abnormality. Osseous structures are osteopenic. Soft tissue swelling noted at the ankle mortise. IMPRESSION: Ankle mortise Soft tissue swelling. No acute osseous abnormalities. Electronically Signed   By: Sydell Eva M.D.   On: 08/29/2023 11:09   US  RENAL Result Date: 08/28/2023 CLINICAL DATA:  Acute kidney injury.  Rhabdomyolysis. EXAM: RENAL / URINARY TRACT  ULTRASOUND COMPLETE COMPARISON:  CT 08/27/2023 FINDINGS: Right Kidney: Renal measurements: 10.0 x 5.1 x 4.8 cm = volume: 129.4 mL. Echogenicity within normal limits. No mass or hydronephrosis visualized. Cyst within the upper and lower pole measure up to 2.3 cm. Left Kidney: Renal measurements: 9.4 x 5.2 x 5.3 cm = volume: 134.4 mL. Echogenicity within normal limits. No mass or hydronephrosis visualized. Bladder: The urinary bladder is partially decompressed with Foley catheter. Other: None. IMPRESSION: 1. No acute findings. No hydronephrosis. 2. Right renal cysts. Electronically Signed   By: Kimberley Penman M.D.   On: 08/28/2023 05:14   CT HEAD WO CONTRAST ( ) Result Date: 08/28/2023 CLINICAL DATA:  Head trauma, minor (Age >= 65y) EXAM: CT HEAD WITHOUT CONTRAST TECHNIQUE: Contiguous axial images were obtained from the base of the skull through the vertex without intravenous contrast. RADIATION DOSE REDUCTION: This exam was performed according to the departmental dose-optimization program which includes automated exposure control, adjustment of the mA and/or kV according to patient size and/or use of iterative reconstruction technique. COMPARISON:  CT head 08/27/2023 FINDINGS: Brain: Cerebral ventricle sizes are concordant with the degree of cerebral volume loss. Patchy and confluent areas of decreased attenuation are noted throughout the deep and periventricular white matter  of the cerebral hemispheres bilaterally, compatible with chronic microvascular ischemic disease. No evidence of large-territorial acute infarction. No parenchymal hemorrhage. No mass lesion. Stable 3 mm right posterior falx cerebri subdural hematoma. No mass effect or midline shift. No hydrocephalus. Basilar cisterns are patent. Vascular: No hyperdense vessel. Atherosclerotic calcifications are present within the cavernous internal carotid and vertebral arteries. Skull: No acute fracture or focal lesion. Sinuses/Orbits: Paranasal sinuses  and mastoid air cells are clear. The orbits are unremarkable. Other: None. IMPRESSION: 1. Stable 3 mm right posterior falx cerebri subdural hematoma. 2. No changes from prior CT head 08/27/2023. Electronically Signed   By: Morgane  Naveau M.D.   On: 08/28/2023 01:52   CT CHEST ABDOMEN PELVIS WO CONTRAST Result Date: 08/27/2023 CLINICAL DATA:  Blunt polytrauma, severe AKI, concern for obstructive uropathy, fall, altered mental status EXAM: CT CHEST, ABDOMEN AND PELVIS WITHOUT CONTRAST TECHNIQUE: Multidetector CT imaging of the chest, abdomen and pelvis was performed following the standard protocol without IV contrast. RADIATION DOSE REDUCTION: This exam was performed according to the departmental dose-optimization program which includes automated exposure control, adjustment of the mA and/or kV according to patient size and/or use of iterative reconstruction technique. COMPARISON:  Same day chest and hip radiographs. CT abdomen pelvis 02/02/2016 FINDINGS: CT CHEST FINDINGS Cardiovascular: Normal heart size. No pericardial effusion. Coronary artery and aortic atherosclerotic calcification. Assessment for vascular injury is limited without IV contrast. Mediastinum/Nodes: Moderate hiatal hernia. Trachea is unremarkable. No mediastinal hematoma. Lungs/Pleura: No focal consolidation, pleural effusion, or pneumothorax. 5 mm nodule in the right middle lobe along the minor fissure (series 5/image 65). Musculoskeletal: Chronic posttraumatic and degenerative changes about the right shoulder. No acute fracture CT ABDOMEN PELVIS FINDINGS Hepatobiliary: Cholecystectomy. Prominent intra and extrahepatic biliary ducts have slightly increased from 2017 likely due to reservoir effect. The common bile duct measures 13 mm, previously 10 mm. Evaluation for solid organ injury is limited with out IV contrast. Pancreas: Unremarkable. Spleen: Unremarkable. Adrenals/Urinary Tract: Stable adrenal glands. No urinary calculi or  hydronephrosis. Unremarkable bladder. Stomach/Bowel: Moderate hiatal hernia. Stomach is otherwise within normal limits. No bowel wall thickening or evidence of obstruction. Moderate stool ball in the rectum. Perirectal fat stranding. Vascular/Lymphatic: Aortic atherosclerosis. No enlarged abdominal or pelvic lymph nodes. Reproductive: Uterus and right ovary are unremarkable. Complex cystic lesion with thin septation in the left ovary measuring 6.6 cm. This measured 4.6 cm on 02/02/2016. Other: Trace free fluid in the pelvis.  No free intraperitoneal air. Musculoskeletal: No acute fracture IMPRESSION: 1. Moderate stool ball in the rectum with perirectal fat stranding, suggestive of fecal impaction developing stercoral colitis. 2. Complex cystic lesion with thin septation in the left ovary measuring 6.6 cm. This measured 4.6 cm on 02/02/2016. Given slow interval growth over 8 years this is likely benign however low-grade cystic neoplasm is not excluded. This could be followed with ultrasound in 6-12 months if clinically appropriate given comorbidities and age. 3. 5 mm nodule in the right middle lobe along the minor fissure. No follow-up needed if patient is low-risk. 4. Moderate hiatal hernia. 5. Aortic Atherosclerosis (ICD10-I70.0). Electronically Signed   By: Rozell Cornet M.D.   On: 08/27/2023 21:10   DG Hip Unilat W or Wo Pelvis 2-3 Views Right Result Date: 08/27/2023 CLINICAL DATA:  Marvell Slider, right hip pain EXAM: DG HIP (WITH OR WITHOUT PELVIS) 2-3V RIGHT COMPARISON:  08/27/2023 FINDINGS: Frontal view of the pelvis as well as frontal and cross-table lateral views of the right hip are obtained. There are no acute displaced  fractures. Alignment is anatomic. Joint spaces are well preserved. Sacroiliac joints are normal. IMPRESSION: 1. Unremarkable pelvis and right hip.  No acute fracture. Electronically Signed   By: Bobbye Burrow M.D.   On: 08/27/2023 20:12   CT HEAD WO CONTRAST ( ) Result Date:  08/27/2023 CLINICAL DATA:  Minor head trauma and neck trauma EXAM: CT HEAD WITHOUT CONTRAST CT CERVICAL SPINE WITHOUT CONTRAST TECHNIQUE: Multidetector CT imaging of the head and cervical spine was performed following the standard protocol without intravenous contrast. Multiplanar CT image reconstructions of the cervical spine were also generated. RADIATION DOSE REDUCTION: This exam was performed according to the departmental dose-optimization program which includes automated exposure control, adjustment of the mA and/or kV according to patient size and/or use of iterative reconstruction technique. COMPARISON:  MRI head 06/09/2022 and CT head 06/08/2018 FINDINGS: CT HEAD FINDINGS Brain: Trace subdural hemorrhage along the posterior falx measuring 4 mm in thickness (series 3/image 14). No mass effect or midline shift. No evidence of acute infarct. No hydrocephalus. No extra-axial fluid collection. Age-commensurate cerebral atrophy and chronic small vessel ischemic disease. Vascular: No hyperdense vessel. Intracranial arterial calcification. 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 Skull: No fracture or focal lesion. Sinuses/Orbits: No acute finding. Other: None. CT CERVICAL SPINE FINDINGS Alignment: No evidence of traumatic listhesis. Skull base and vertebrae: No acute fracture. Soft tissues and spinal canal: No prevertebral fluid or swelling. No visible canal hematoma. Disc levels: Age-related multilevel spondylosis and facet arthropathy. No severe spinal canal narrowing. Upper chest: Frothy debris in the esophagus reaching above the thoracic inlet. Other: None. IMPRESSION: CT HEAD: 1. Trace subdural hemorrhage along the posterior falx measuring 4 mm in thickness. No mass effect or midline shift. 2. 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 TRAUMATIC BRAIN INJURY RISK STRATIFICATION Skull Fracture: No - Low/mBIG 1 Subdural Hematoma (SDH): 4mm to <4mm - mBIG 2 Subarachnoid Hemorrhage [facility]): No  Epidural Hematoma (EDH): No - Low/mBIG 1 Cerebral contusion, intra-axial, intraparenchymal Hemorrhage (IPH): No Intraventricular Hemorrhage (IVH): No - Low/mBIG 1 Midline Shift > 1mm or Edema/effacement of sulci/vents: No - Low/mBIG 1 CT CERVICAL SPINE: 1. No acute fracture in the cervical spine. 2. Frothy debris in the esophagus reaching above the thoracic inlet, which is an aspiration risk. Critical Value/emergent results were called by telephone at the time of interpretation on 08/27/2023 at 7:22 pm to provider MADISON [facility] , who verbally acknowledged these results. Electronically Signed   By: Rozell Cornet M.D.   On: 08/27/2023 19:27   CT CERVICAL SPINE WO CONTRAST Result Date: 08/27/2023 CLINICAL DATA:  Minor head trauma and neck trauma EXAM: CT HEAD WITHOUT CONTRAST CT CERVICAL SPINE WITHOUT CONTRAST TECHNIQUE: Multidetector CT imaging of the head and cervical spine was performed following the standard protocol without intravenous contrast. Multiplanar CT image reconstructions of the cervical spine were also generated. RADIATION DOSE REDUCTION: This exam was performed according to the departmental dose-optimization program which includes automated exposure control, adjustment of the mA and/or kV according to patient size and/or use of iterative reconstruction technique. COMPARISON:  MRI head 06/09/2022 and CT head 06/08/2018 FINDINGS: CT HEAD FINDINGS Brain: Trace subdural hemorrhage along the posterior falx measuring 4 mm in thickness (series 3/image 14). No mass effect or midline shift. No evidence of acute infarct. No hydrocephalus. No extra-axial fluid collection. Age-commensurate cerebral atrophy and chronic small vessel ischemic disease. Vascular: No hyperdense vessel. Intracranial arterial calcification. 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 Skull: No fracture or focal lesion. Sinuses/Orbits: No acute finding.  Other: None. CT CERVICAL SPINE FINDINGS Alignment: No evidence of traumatic  listhesis. Skull base and vertebrae: No acute fracture. Soft tissues and spinal canal: No prevertebral fluid or swelling. No visible canal hematoma. Disc levels: Age-related multilevel spondylosis and facet arthropathy. No severe spinal canal narrowing. Upper chest: Frothy debris in the esophagus reaching above the thoracic inlet. Other: None. IMPRESSION: CT HEAD: 1. Trace subdural hemorrhage along the posterior falx measuring 4 mm in thickness. No mass effect or midline shift. 2. 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 TRAUMATIC BRAIN INJURY RISK STRATIFICATION Skull Fracture: No - Low/mBIG 1 Subdural Hematoma (SDH): 4mm to <64mm - mBIG 2 Subarachnoid Hemorrhage [facility]): No Epidural Hematoma (EDH): No - Low/mBIG 1 Cerebral contusion, intra-axial, intraparenchymal Hemorrhage (IPH): No Intraventricular Hemorrhage (IVH): No - Low/mBIG 1 Midline Shift > 1mm or Edema/effacement of sulci/vents: No - Low/mBIG 1 CT CERVICAL SPINE: 1. No acute fracture in the cervical spine. 2. Frothy debris in the esophagus reaching above the thoracic inlet, which is an aspiration risk. Critical Value/emergent results were called by telephone at the time of interpretation on 08/27/2023 at 7:22 pm to provider MADISON [facility] , who verbally acknowledged these results. Electronically Signed   By: Rozell Cornet M.D.   On: 08/27/2023 19:27   DG Chest Portable 1 View Result Date: 08/27/2023 CLINICAL DATA:  Fall, concern for fracture. EXAM: PORTABLE CHEST 1 VIEW, PORTABLE PELVIS COMPARISON:  None Available. FINDINGS: Chest: The heart size and mediastinal contours are within normal limits. There is atherosclerotic calcification of the aorta. Minimal atelectasis is noted at the left lung base. There is mild scarring at the right lung apex. No consolidation, effusion, or pneumothorax is seen. Surgical clips are noted in the right upper quadrant. No acute osseous abnormality is seen. Pelvis: No acute pelvic fracture is seen. Overlapping  structures are noted at the right femoral head and the possibility of right hip fracture cannot be excluded. There is no dislocation. Mild degenerative changes are noted at the hips bilaterally and lower lumbar spine. IMPRESSION: 1. No active disease in the chest. 2. No acute pelvic fracture. 3. Overlapping structures are present at the right hip and the possibility of subcapital femoral fracture cannot be excluded. Dedicated images of the right hip are recommended. Electronically Signed   By: Wyvonnia Heimlich M.D.   On: 08/27/2023 18:56   DG Pelvis Portable Result Date: 08/27/2023 CLINICAL DATA:  Fall, concern for fracture. EXAM: PORTABLE CHEST 1 VIEW, PORTABLE PELVIS COMPARISON:  None Available. FINDINGS: Chest: The heart size and mediastinal contours are within normal limits. There is atherosclerotic calcification of the aorta. Minimal atelectasis is noted at the left lung base. There is mild scarring at the right lung apex. No consolidation, effusion, or pneumothorax is seen. Surgical clips are noted in the right upper quadrant. No acute osseous abnormality is seen. Pelvis: No acute pelvic fracture is seen. Overlapping structures are noted at the right femoral head and the possibility of right hip fracture cannot be excluded. There is no dislocation. Mild degenerative changes are noted at the hips bilaterally and lower lumbar spine. IMPRESSION: 1. No active disease in the chest. 2. No acute pelvic fracture. 3. Overlapping structures are present at the right hip and the possibility of subcapital femoral fracture cannot be excluded. Dedicated images of the right hip are recommended. Electronically Signed   By: Wyvonnia Heimlich M.D.   On: 08/27/2023 18:56     Subjective:  No significant events overnight as discussed with staff, patient denies  any complaints today Discharge Exam: Vitals:   09/04/23 1959 09/05/23 0700  BP: (!) 131/48 (!) 102/48  Pulse: 71 62  Resp: 20 18  Temp: 97.6 F (36.4 C) 97.8 F  (36.6 C)  SpO2: 93% 94%   Vitals:   09/03/23 1957 09/04/23 0748 09/04/23 1959 09/05/23 0700  BP: (!) 98/46 (!) 126/51 (!) 131/48 (!) 102/48  Pulse: 72 67 71 62  Resp: 16 18 20 18   Temp: 97.9 F (36.6 C) 98.2 F (36.8 C) 97.6 F (36.4 C) 97.8 F (36.6 C)  TempSrc: Oral Oral Oral Axillary  SpO2:  97% 93% 94%  Weight:      Height:        General: Patient is somnolent, open up to her eyes, but extremely frail, deconditioned Cardiovascular: RRR, S1/S2 +, no rubs, no gallops Respiratory: diminished air entry, but clear to auscultation Abdominal: Soft, NT, ND, bowel sounds + Extremities: no edema, no cyanosis    The results of significant diagnostics from this hospitalization (including imaging, microbiology, ancillary and laboratory) are listed below for reference.     Microbiology: No results found for this or any previous visit (from the past 240 hours).   Labs: BNP (last 3 results) No results for input(s): "BNP" in the last 8760 hours. Basic Metabolic Panel: Recent Labs  Lab 08/30/23 0411 08/31/23 0351 09/01/23 1044  NA 136 132* 133*  K 4.1 4.4 4.5  CL 108 107 105  CO2 19* 18* 17*  GLUCOSE 87 89 135*  BUN 84* 88* 99*  CREATININE 5.73* 6.20* 6.73*  CALCIUM  8.9 8.8* 9.3  MG 1.7  --   --   PHOS 2.8  --   --    Liver Function Tests: No results for input(s): "AST", "ALT", "ALKPHOS", "BILITOT", "PROT", "ALBUMIN" in the last 168 hours. No results for input(s): "LIPASE", "AMYLASE" in the last 168 hours. No results for input(s): "AMMONIA" in the last 168 hours. CBC: Recent Labs  Lab 08/30/23 0411  WBC 10.5  NEUTROABS 8.3*  HGB 10.5*  HCT 31.0*  MCV 88.8  PLT 120*   Cardiac Enzymes: Recent Labs  Lab 08/30/23 0411  CKTOTAL 2,807*   BNP: Invalid input(s): "POCBNP" CBG: Recent Labs  Lab 08/29/23 1217  GLUCAP 138*   D-Dimer No results for input(s): "DDIMER" in the last 72 hours. Hgb A1c No results for input(s): "HGBA1C" in the last 72 hours. Lipid  Profile No results for input(s): "CHOL", "HDL", "LDLCALC", "TRIG", "CHOLHDL", "LDLDIRECT" in the last 72 hours. Thyroid  function studies No results for input(s): "TSH", "T4TOTAL", "T3FREE", "THYROIDAB" in the last 72 hours.  Invalid input(s): "FREET3" Anemia work up No results for input(s): "VITAMINB12", "FOLATE", "FERRITIN", "TIBC", "IRON", "RETICCTPCT" in the last 72 hours. Urinalysis    Component Value Date/Time   COLORURINE YELLOW 08/27/2023 1813   APPEARANCEUR CLEAR 08/27/2023 1813   LABSPEC 1.012 08/27/2023 1813   PHURINE 5.0 08/27/2023 1813   GLUCOSEU NEGATIVE 08/27/2023 1813   HGBUR LARGE (A) 08/27/2023 1813   BILIRUBINUR NEGATIVE 08/27/2023 1813   KETONESUR 5 (A) 08/27/2023 1813   PROTEINUR 30 (A) 08/27/2023 1813   NITRITE NEGATIVE 08/27/2023 1813   LEUKOCYTESUR NEGATIVE 08/27/2023 1813   Sepsis Labs Recent Labs  Lab 08/30/23 0411  WBC 10.5   Microbiology No results found for this or any previous visit (from the past 240 hours).   Time coordinating discharge: Over 30 minutes  SIGNED:   Seena Dadds, MD  [facility] 09/05/2023, 11:36 AM Pager   If 7PM-7AM, please  contact night-coverage [email or web address]

## 2023-09-05 NOTE — Consult Note (Signed)
 Daily Progress Note   Patient Name: Tiffany Rice       Date: 09/05/2023 DOB: 1931-08-31  Age: 89 y.o. MRN#: [id number] Attending Physician: Epifanio Haste, MD Primary Care Physician: Tita Form, MD Admit Date: 08/27/2023  Reason for Consultation/Follow-up: Disposition, EOL monitoring, and GOC  Subjective:   89 y.o. female with past medical history of CVA on Plavix , CKD stage II, hypothyroidism, essential hypertension, hyperlipidemia and hypothyroidism admitted on 08/27/2023 with fall, generalized weakness, overall decline. From ALF ([facility]). Most notably, she was diagnosed with a subdural hemorrhage (fortunately, felt to be stable at present, holding antiplatelets), AKI superimposed on CKD stage II in the setting of rhabdomyolysis and acute urinary retention (status post Foley insertion), acute on chronic constipation for which she is being managed with a p.o. bowel regimen. She also had an ischemic stroke back in March 2024 which required a brief skilled nursing facility stay for rehab.   Today, patient continues to decline with minimal p.o. intake and more drowsy/confused.  She does open eyes and respond to some questions.  She denies any pain, shortness of breath, nausea, vomiting, or other symptoms.  She states she is doing okay.  She has no concerns at present.  No new pertinent positive ROS responses other than those listed in HPI.   Discussion: patient ready to leave the hospital, otherwise limited ability to converse with her. Called son [name]) and no answer. Left VM requesting return call for any needs.   Length of Stay: 9   Physical Exam Constitutional:      Appearance: She is ill-appearing.  HENT:     Mouth/Throat:     Mouth: Mucous membranes are dry.  Pulmonary:     Effort: Pulmonary effort is normal. No  respiratory distress.  Abdominal:     Palpations: Abdomen is soft.     Tenderness: There is no abdominal tenderness.  Neurological:     Comments: Drowsy but will wake up and answer questions but rapidly falls back to sleep             Vital Signs: BP (!) 102/48 (BP Location: Left Arm)   Pulse 62   Temp 97.8 F (36.6 C) (Axillary)   Resp 18   Ht 5\' 6"  (1.676 m)   Wt 64.8 kg   SpO2 94%   BMI 23.06 kg/m  SpO2: SpO2: 94 % O2 Device: O2 Device: Room Air O2 Flow Rate:        Palliative Assessment/Data: 20%   Palliative Care Assessment & Plan   Patient Profile:  89 year old female with new onset end-stage renal disease.  Not a candidate for dialysis.  Remains comfort care only. Comfort needs are met at this time.  Continues to decline/evidence of progressive uremia, nominal PO intake.  Hospice liaison from [facility] declined inpatient hospice admission at present.  Fortunately, [facility] [place] accepted patient and plan is to discharge to inpatient hospice.  I agree with inpatient hospice as I anticipate a continued, progressive, and rapid renal function, worsening uremia, declining mental status, total dependence for all ADLs, and in anticipation of upcoming symptom management needs.   Recommendations/Plan: Transition to inpatient hospice DNR/DNI, comfort care only Continue pleasure feeding  Continue Tylenol  PRN pain, low threshold to escalate for pain that does not respond to Tylenol  or for any shortness of breath  Continue current bowel regimen (Senna, Miralax )   Prognosis:  < 2 weeks  Discharge Planning: Recommend GIP Admission (Hospice in Place)  Care plan was discussed with SW, nursing team (nurse tech)   Total time: I spent 25 minutes in the care of the patient today in the above activities and documenting the encounter.         Render Carrie, NP  Palliative Medicine Team Team phone # [phone]  Thank you for allowing the Palliative Medicine  Team to assist in the care of this patient. Please utilize secure chat with additional questions, if there is no response within 30 minutes please call the above phone number.  Palliative Medicine Team providers are available by phone from 7am to 7pm daily and can be reached through the team cell phone.  Should this patient require assistance outside of these hours, please call the patient's attending physician.

## 2023-09-05 NOTE — Discharge Instructions (Signed)
Management per residential hospice.

## 2023-09-05 NOTE — Plan of Care (Signed)
  Problem: Clinical Measurements: Goal: Ability to maintain clinical measurements within normal limits will improve Outcome: Progressing   Problem: Coping: Goal: Level of anxiety will decrease Outcome: Progressing   Problem: Safety: Goal: Ability to remain free from injury will improve Outcome: Progressing   

## 2023-09-05 NOTE — Plan of Care (Signed)
  Problem: Education: Goal: Knowledge of General Education information will improve Description: Including pain rating scale, medication(s)/side effects and non-pharmacologic comfort measures Outcome: Not Met (add Reason)   Problem: Health Behavior/Discharge Planning: Goal: Ability to manage health-related needs will improve Outcome: Not Met (add Reason)   Problem: Clinical Measurements: Goal: Ability to maintain clinical measurements within normal limits will improve Outcome: Not Met (add Reason) Goal: Will remain free from infection Outcome: Not Met (add Reason) Goal: Diagnostic test results will improve Outcome: Not Met (add Reason) Goal: Respiratory complications will improve Outcome: Not Met (add Reason) Goal: Cardiovascular complication will be avoided Outcome: Not Met (add Reason)   Problem: Activity: Goal: Risk for activity intolerance will decrease Outcome: Not Met (add Reason)   Problem: Nutrition: Goal: Adequate nutrition will be maintained Outcome: Not Met (add Reason)   Problem: Coping: Goal: Level of anxiety will decrease Outcome: Not Met (add Reason)   Problem: Elimination: Goal: Will not experience complications related to bowel motility Outcome: Not Met (add Reason) Goal: Will not experience complications related to urinary retention Outcome: Not Met (add Reason)   Problem: Pain Managment: Goal: General experience of comfort will improve and/or be controlled Outcome: Not Met (add Reason)   Problem: Safety: Goal: Ability to remain free from injury will improve Outcome: Not Met (add Reason)   Problem: Skin Integrity: Goal: Risk for impaired skin integrity will decrease Outcome: Not Met (add Reason)

## 2023-09-05 NOTE — TOC Transition Note (Signed)
 Transition of Care [facility]) - Discharge Note   Patient Details  Name: Tiffany Rice MRN: [id number] Date of Birth: [birth year]  [facility]) CM/SW Contact:  Jannice Mends, LCSW Phone Number: 09/05/2023, 3:19 PM   Clinical Narrative:    Patient will DC to: [facility] Anticipated DC date: 09/05/23 Family notified: Son Transport by: Lyna Sandhoff   Per MD patient ready for DC to [facility]. RN to call report prior to discharge [phone]). RN, patient, patient's family, and facility notified of DC. Discharge Summary sent to facility. DC packet on chart. Ambulance transport requested for patient.   CSW will sign off for now as social work intervention is no longer needed. Please consult us  again if new needs arise.     Final next level of care: Hospice Medical Facility Barriers to Discharge: Barriers Resolved   Patient Goals and CMS Choice Patient states their goals for this hospitalization and ongoing recovery are:: Comfort CMS Medicare.gov Compare Post Acute Care list provided to:: Patient Represenative (must comment) Choice offered to / list presented to : Adult Children [place] ownership interest in [facility].provided to:: Adult Children    Discharge Placement              Patient chooses bed at:  [facility]) Patient to be transferred to facility by: [facility] Name of family member notified: Son Patient and family notified of of transfer: 09/05/23  Discharge Plan and Services Additional resources added to the After Visit Summary for   In-house Referral: Clinical Social Work   Post Acute Care Choice: Hospice                               Social Drivers of Health (SDOH) Interventions SDOH Screenings   Food Insecurity: No Food Insecurity (08/28/2023)  Housing: Low Risk  (08/30/2023)  Transportation Needs: No Transportation Needs (08/30/2023)  Utilities: Not At Risk  (08/30/2023)  Social Connections: Unknown (08/30/2023)  Tobacco Use: Medium Risk (08/27/2023)     Readmission Risk Interventions     No data to display

## 2023-09-05 NOTE — Progress Notes (Signed)
 AVS in packet for discharge  DNR and MOST form also in packet along with Transport Certificate

## 2023-10-04 DEATH — deceased

## 2024-01-17 ENCOUNTER — Telehealth: Payer: Self-pay | Admitting: Neurology

## 2024-01-17 NOTE — Telephone Encounter (Signed)
 Son, Norleen Clinton notified and cancelled appointment due to patient passing away.  Offered Mr. Sappiington our condolences in his mother's passing.

## 2024-01-24 ENCOUNTER — Ambulatory Visit: Admitting: Neurology
